# Patient Record
Sex: Male | Born: 1950 | Race: Black or African American | Hispanic: No | Marital: Single | Smoking: Never smoker
Health system: Southern US, Community
[De-identification: ages and names within clinical notes are randomized; demographics above are authoritative.]

## PROBLEM LIST (undated history)

## (undated) DIAGNOSIS — K219 Gastro-esophageal reflux disease without esophagitis: Secondary | ICD-10-CM

## (undated) DIAGNOSIS — E039 Hypothyroidism, unspecified: Secondary | ICD-10-CM

## (undated) DIAGNOSIS — R569 Unspecified convulsions: Secondary | ICD-10-CM

## (undated) DIAGNOSIS — I10 Essential (primary) hypertension: Secondary | ICD-10-CM

## (undated) DIAGNOSIS — F419 Anxiety disorder, unspecified: Secondary | ICD-10-CM

## (undated) DIAGNOSIS — Z992 Dependence on renal dialysis: Secondary | ICD-10-CM

## (undated) DIAGNOSIS — I2510 Atherosclerotic heart disease of native coronary artery without angina pectoris: Secondary | ICD-10-CM

## (undated) DIAGNOSIS — F79 Unspecified intellectual disabilities: Secondary | ICD-10-CM

## (undated) DIAGNOSIS — N289 Disorder of kidney and ureter, unspecified: Secondary | ICD-10-CM

## (undated) HISTORY — PX: AV FISTULA PLACEMENT: SHX1204

## (undated) HISTORY — PX: INSERT / REPLACE / REMOVE PACEMAKER: SUR710

---

## 2010-02-27 ENCOUNTER — Emergency Department: Payer: Self-pay | Admitting: Emergency Medicine

## 2010-04-04 ENCOUNTER — Ambulatory Visit: Payer: Self-pay | Admitting: Vascular Surgery

## 2010-04-25 ENCOUNTER — Ambulatory Visit: Payer: Self-pay | Admitting: Vascular Surgery

## 2010-05-10 ENCOUNTER — Inpatient Hospital Stay: Payer: Self-pay | Admitting: Internal Medicine

## 2010-05-28 ENCOUNTER — Inpatient Hospital Stay: Payer: Self-pay | Admitting: Internal Medicine

## 2010-06-08 ENCOUNTER — Ambulatory Visit: Payer: Self-pay | Admitting: Family Medicine

## 2010-06-27 ENCOUNTER — Ambulatory Visit: Payer: Self-pay | Admitting: Internal Medicine

## 2010-07-16 ENCOUNTER — Ambulatory Visit: Payer: Self-pay | Admitting: Cardiovascular Disease

## 2010-07-16 ENCOUNTER — Inpatient Hospital Stay: Payer: Self-pay | Admitting: Internal Medicine

## 2010-07-27 ENCOUNTER — Ambulatory Visit: Payer: Self-pay | Admitting: Internal Medicine

## 2010-08-01 ENCOUNTER — Ambulatory Visit: Payer: Self-pay | Admitting: Vascular Surgery

## 2010-08-07 ENCOUNTER — Ambulatory Visit: Payer: Self-pay | Admitting: Internal Medicine

## 2010-08-07 ENCOUNTER — Ambulatory Visit: Payer: Self-pay | Admitting: Vascular Surgery

## 2010-08-26 ENCOUNTER — Ambulatory Visit: Payer: Self-pay | Admitting: Vascular Surgery

## 2010-09-02 ENCOUNTER — Ambulatory Visit: Payer: Self-pay | Admitting: Vascular Surgery

## 2010-09-24 ENCOUNTER — Ambulatory Visit: Payer: Self-pay | Admitting: Internal Medicine

## 2010-10-08 ENCOUNTER — Inpatient Hospital Stay: Payer: Self-pay | Admitting: *Deleted

## 2010-11-07 ENCOUNTER — Inpatient Hospital Stay: Payer: Self-pay | Admitting: Vascular Surgery

## 2010-12-03 ENCOUNTER — Inpatient Hospital Stay: Payer: Self-pay | Admitting: Internal Medicine

## 2010-12-25 ENCOUNTER — Ambulatory Visit: Payer: Self-pay | Admitting: Vascular Surgery

## 2011-01-03 ENCOUNTER — Ambulatory Visit: Payer: Self-pay | Admitting: Vascular Surgery

## 2011-01-17 ENCOUNTER — Ambulatory Visit: Payer: Self-pay | Admitting: Vascular Surgery

## 2011-01-24 ENCOUNTER — Ambulatory Visit: Payer: Self-pay | Admitting: Vascular Surgery

## 2011-03-04 ENCOUNTER — Inpatient Hospital Stay: Payer: Self-pay | Admitting: Internal Medicine

## 2011-04-02 ENCOUNTER — Ambulatory Visit: Payer: Self-pay | Admitting: Vascular Surgery

## 2011-06-17 ENCOUNTER — Emergency Department: Payer: Self-pay | Admitting: Emergency Medicine

## 2011-06-17 ENCOUNTER — Other Ambulatory Visit: Payer: Self-pay | Admitting: Family Medicine

## 2011-07-02 ENCOUNTER — Ambulatory Visit: Payer: Self-pay | Admitting: Vascular Surgery

## 2011-07-09 ENCOUNTER — Ambulatory Visit: Payer: Self-pay | Admitting: Vascular Surgery

## 2011-07-24 ENCOUNTER — Ambulatory Visit: Payer: Self-pay | Admitting: Family Medicine

## 2011-08-03 ENCOUNTER — Other Ambulatory Visit: Payer: Self-pay | Admitting: Family Medicine

## 2011-08-04 ENCOUNTER — Observation Stay: Payer: Self-pay | Admitting: Nephrology

## 2011-08-07 ENCOUNTER — Inpatient Hospital Stay: Payer: Self-pay | Admitting: *Deleted

## 2011-09-03 ENCOUNTER — Inpatient Hospital Stay: Payer: Self-pay | Admitting: Vascular Surgery

## 2011-12-03 ENCOUNTER — Ambulatory Visit: Payer: Self-pay | Admitting: Vascular Surgery

## 2012-01-06 ENCOUNTER — Observation Stay: Payer: Self-pay | Admitting: Internal Medicine

## 2012-01-06 LAB — PROTIME-INR
INR: 2.1
Prothrombin Time: 24.2 secs — ABNORMAL HIGH (ref 11.5–14.7)

## 2012-01-06 LAB — COMPREHENSIVE METABOLIC PANEL
Albumin: 3.7 g/dL (ref 3.4–5.0)
Anion Gap: 13 (ref 7–16)
BUN: 102 mg/dL — ABNORMAL HIGH (ref 7–18)
Bilirubin,Total: 0.4 mg/dL (ref 0.2–1.0)
Calcium, Total: 8 mg/dL — ABNORMAL LOW (ref 8.5–10.1)
Co2: 19 mmol/L — ABNORMAL LOW (ref 21–32)
Glucose: 101 mg/dL — ABNORMAL HIGH (ref 65–99)
Osmolality: 297 (ref 275–301)
Potassium: 5.9 mmol/L — ABNORMAL HIGH (ref 3.5–5.1)
SGOT(AST): 20 U/L (ref 15–37)
SGPT (ALT): 15 U/L
Total Protein: 7.7 g/dL (ref 6.4–8.2)

## 2012-01-06 LAB — CBC
HGB: 11.6 g/dL — ABNORMAL LOW (ref 13.0–18.0)
MCH: 31.8 pg (ref 26.0–34.0)
MCHC: 31.3 g/dL — ABNORMAL LOW (ref 32.0–36.0)
Platelet: 124 10*3/uL — ABNORMAL LOW (ref 150–440)

## 2012-01-06 LAB — PHENYTOIN LEVEL, TOTAL: Dilantin: 20.1 ug/mL — ABNORMAL HIGH (ref 10.0–20.0)

## 2012-01-06 LAB — TROPONIN I: Troponin-I: <0.02 ng/mL

## 2012-01-07 LAB — BASIC METABOLIC PANEL
Anion Gap: 17 — ABNORMAL HIGH (ref 7–16)
BUN: 101 mg/dL — ABNORMAL HIGH (ref 7–18)
Chloride: 103 mmol/L (ref 98–107)
Co2: 19 mmol/L — ABNORMAL LOW (ref 21–32)
Creatinine: 12.86 mg/dL — ABNORMAL HIGH (ref 0.60–1.30)
EGFR (African American): 5 — ABNORMAL LOW
Glucose: 85 mg/dL (ref 65–99)
Osmolality: 308 (ref 275–301)
Sodium: 139 mmol/L (ref 136–145)

## 2012-01-07 LAB — PROTIME-INR
INR: 2.4
Prothrombin Time: 26.4 secs — ABNORMAL HIGH (ref 11.5–14.7)

## 2012-01-07 LAB — CBC WITH DIFFERENTIAL/PLATELET
Basophil #: 0 10*3/uL (ref 0.0–0.1)
Basophil %: 0.5 %
Eosinophil %: 3.2 %
HCT: 30.6 % — ABNORMAL LOW (ref 40.0–52.0)
HCT: 31.8 % — ABNORMAL LOW (ref 40.0–52.0)
HGB: 9.8 g/dL — ABNORMAL LOW (ref 13.0–18.0)
HGB: 10.1 g/dL — ABNORMAL LOW (ref 13.0–18.0)
Lymphocyte #: 0.5 10*3/uL — ABNORMAL LOW (ref 1.0–3.6)
Lymphocyte %: 14.3 %
MCH: 32.3 pg (ref 26.0–34.0)
MCHC: 32.1 g/dL (ref 32.0–36.0)
MCV: 101 fL — ABNORMAL HIGH (ref 80–100)
MCV: 101 fL — ABNORMAL HIGH (ref 80–100)
Monocyte #: 0.3 10*3/uL (ref 0.0–0.7)
Monocyte %: 7.7 %
Neutrophil #: 2.8 10*3/uL (ref 1.4–6.5)
Platelet: 117 10*3/uL — ABNORMAL LOW (ref 150–440)
Platelet: 118 10*3/uL — ABNORMAL LOW (ref 150–440)
RBC: 3.14 10*6/uL — ABNORMAL LOW (ref 4.40–5.90)
RDW: 17.8 % — ABNORMAL HIGH (ref 11.5–14.5)
WBC: 3.8 10*3/uL (ref 3.8–10.6)
WBC: 3.6 10*3/uL — ABNORMAL LOW (ref 3.8–10.6)

## 2012-01-07 LAB — RENAL FUNCTION PANEL
Albumin: 3.1 g/dL — ABNORMAL LOW (ref 3.4–5.0)
Anion Gap: 13 (ref 7–16)
Calcium, Total: 7.4 mg/dL — ABNORMAL LOW (ref 8.5–10.1)
Chloride: 102 mmol/L (ref 98–107)
Co2: 21 mmol/L (ref 21–32)
Creatinine: 12.26 mg/dL — ABNORMAL HIGH (ref 0.60–1.30)
EGFR (African American): 5 — ABNORMAL LOW
EGFR (Non-African Amer.): 4 — ABNORMAL LOW
Glucose: 88 mg/dL (ref 65–99)
Phosphorus: 4.7 mg/dL (ref 2.5–4.9)
Potassium: 5.2 mmol/L — ABNORMAL HIGH (ref 3.5–5.1)

## 2012-01-07 LAB — TSH: Thyroid Stimulating Horm: 4.74 u[IU]/mL — ABNORMAL HIGH

## 2012-01-08 LAB — BASIC METABOLIC PANEL
BUN: 47 mg/dL — ABNORMAL HIGH (ref 7–18)
Calcium, Total: 7.9 mg/dL — ABNORMAL LOW (ref 8.5–10.1)
Chloride: 99 mmol/L (ref 98–107)
Co2: 27 mmol/L (ref 21–32)
Creatinine: 7.66 mg/dL — ABNORMAL HIGH (ref 0.60–1.30)
EGFR (Non-African Amer.): 8 — ABNORMAL LOW
Glucose: 78 mg/dL (ref 65–99)
Osmolality: 289 (ref 275–301)
Potassium: 4.2 mmol/L (ref 3.5–5.1)
Sodium: 139 mmol/L (ref 136–145)

## 2012-01-08 LAB — CBC WITH DIFFERENTIAL/PLATELET
Basophil #: 0 10*3/uL (ref 0.0–0.1)
Eosinophil #: 0.2 10*3/uL (ref 0.0–0.7)
Eosinophil %: 5.8 %
HCT: 31.6 % — ABNORMAL LOW (ref 40.0–52.0)
HGB: 10 g/dL — ABNORMAL LOW (ref 13.0–18.0)
Lymphocyte %: 20.9 %
MCH: 32.1 pg (ref 26.0–34.0)
MCHC: 31.8 g/dL — ABNORMAL LOW (ref 32.0–36.0)
MCV: 101 fL — ABNORMAL HIGH (ref 80–100)
Monocyte %: 13.6 %
Neutrophil #: 1.9 10*3/uL (ref 1.4–6.5)
Neutrophil %: 59.1 %
Platelet: 97 10*3/uL — ABNORMAL LOW (ref 150–440)
RDW: 17.6 % — ABNORMAL HIGH (ref 11.5–14.5)

## 2012-01-08 LAB — PHOSPHORUS: Phosphorus: 4.1 mg/dL (ref 2.5–4.9)

## 2012-01-14 ENCOUNTER — Ambulatory Visit: Payer: Self-pay | Admitting: Internal Medicine

## 2012-01-14 LAB — PROTIME-INR
INR: 1.7
Prothrombin Time: 19.9 secs — ABNORMAL HIGH (ref 11.5–14.7)

## 2012-01-14 LAB — FERRITIN: Ferritin (ARMC): 595 ng/mL — ABNORMAL HIGH (ref 8–388)

## 2012-01-14 LAB — CBC CANCER CENTER
Basophil #: 0 x10 3/mm (ref 0.0–0.1)
Basophil %: 0.3 %
Eosinophil #: 0.2 x10 3/mm (ref 0.0–0.7)
Eosinophil %: 3.5 %
Eosinophil: 5 %
HCT: 34.4 % — ABNORMAL LOW (ref 40.0–52.0)
HGB: 10.7 g/dL — ABNORMAL LOW (ref 13.0–18.0)
MCH: 31.9 pg (ref 26.0–34.0)
MCHC: 31.1 g/dL — ABNORMAL LOW (ref 32.0–36.0)
MCV: 102 fL — ABNORMAL HIGH (ref 80–100)
Monocyte #: 0.4 x10 3/mm (ref 0.0–0.7)
Monocytes: 4 %
Neutrophil #: 3.2 x10 3/mm (ref 1.4–6.5)
RDW: 17.2 % — ABNORMAL HIGH (ref 11.5–14.5)

## 2012-01-14 LAB — LACTATE DEHYDROGENASE: LDH: 206 U/L (ref 87–241)

## 2012-01-14 LAB — IRON AND TIBC
Iron Saturation: 93 %
Iron: 99 ug/dL (ref 65–175)

## 2012-01-14 LAB — RETICULOCYTES
Absolute Retic Count: 0.02 10*6/uL — ABNORMAL LOW (ref 0.024–0.084)
Reticulocyte: 0.6 % (ref 0.5–1.5)

## 2012-01-26 ENCOUNTER — Ambulatory Visit: Payer: Self-pay | Admitting: Internal Medicine

## 2012-01-28 ENCOUNTER — Emergency Department: Payer: Self-pay | Admitting: *Deleted

## 2012-01-29 ENCOUNTER — Ambulatory Visit: Payer: Self-pay | Admitting: Internal Medicine

## 2012-02-25 ENCOUNTER — Ambulatory Visit: Payer: Self-pay | Admitting: Internal Medicine

## 2012-04-16 ENCOUNTER — Ambulatory Visit: Payer: Self-pay | Admitting: Vascular Surgery

## 2012-04-28 ENCOUNTER — Ambulatory Visit: Payer: Self-pay | Admitting: Internal Medicine

## 2012-04-28 LAB — CBC CANCER CENTER
Eosinophil %: 2.5 %
Lymphocyte #: 0.7 x10 3/mm — ABNORMAL LOW (ref 1.0–3.6)
MCH: 33.2 pg (ref 26.0–34.0)
MCHC: 31.1 g/dL — ABNORMAL LOW (ref 32.0–36.0)
MCV: 107 fL — ABNORMAL HIGH (ref 80–100)
Monocyte #: 0.6 x10 3/mm (ref 0.2–1.0)
Monocyte %: 11.5 %
Neutrophil #: 3.4 x10 3/mm (ref 1.4–6.5)
Neutrophil %: 71.2 %
Platelet: 119 x10 3/mm — ABNORMAL LOW (ref 150–440)
RBC: 3.53 10*6/uL — ABNORMAL LOW (ref 4.40–5.90)
RDW: 17.4 % — ABNORMAL HIGH (ref 11.5–14.5)
WBC: 4.8 x10 3/mm (ref 3.8–10.6)

## 2012-05-25 ENCOUNTER — Ambulatory Visit: Payer: Self-pay | Admitting: Vascular Surgery

## 2012-05-27 ENCOUNTER — Ambulatory Visit: Payer: Self-pay | Admitting: Internal Medicine

## 2012-10-13 ENCOUNTER — Ambulatory Visit: Payer: Self-pay | Admitting: Vascular Surgery

## 2012-12-10 ENCOUNTER — Ambulatory Visit: Payer: Self-pay | Admitting: Vascular Surgery

## 2013-01-04 ENCOUNTER — Ambulatory Visit: Payer: Self-pay | Admitting: Internal Medicine

## 2013-03-28 ENCOUNTER — Ambulatory Visit: Payer: Self-pay | Admitting: Nephrology

## 2013-04-07 ENCOUNTER — Other Ambulatory Visit: Payer: Self-pay | Admitting: Family Medicine

## 2013-04-07 LAB — CBC WITH DIFFERENTIAL/PLATELET
Basophil #: 0.1 10*3/uL (ref 0.0–0.1)
Basophil %: 2.8 %
HCT: 29.1 % — ABNORMAL LOW (ref 40.0–52.0)
HGB: 9.4 g/dL — ABNORMAL LOW (ref 13.0–18.0)
Lymphocyte #: 0.8 10*3/uL — ABNORMAL LOW (ref 1.0–3.6)
Lymphocyte %: 32.3 %
MCHC: 32.3 g/dL (ref 32.0–36.0)
MCV: 100 fL (ref 80–100)
Monocyte %: 12.2 %
Neutrophil #: 1.3 10*3/uL — ABNORMAL LOW (ref 1.4–6.5)
Neutrophil %: 48.4 %
Platelet: 77 10*3/uL — ABNORMAL LOW (ref 150–440)
RBC: 2.89 10*6/uL — ABNORMAL LOW (ref 4.40–5.90)
WBC: 2.6 10*3/uL — ABNORMAL LOW (ref 3.8–10.6)

## 2013-04-07 LAB — COMPREHENSIVE METABOLIC PANEL
Albumin: 2.9 g/dL — ABNORMAL LOW (ref 3.4–5.0)
Anion Gap: 12 (ref 7–16)
BUN: 63 mg/dL — ABNORMAL HIGH (ref 7–18)
Calcium, Total: 7.7 mg/dL — ABNORMAL LOW (ref 8.5–10.1)
Glucose: 83 mg/dL (ref 65–99)
Osmolality: 295 (ref 275–301)
SGOT(AST): 13 U/L — ABNORMAL LOW (ref 15–37)
Total Protein: 5.3 g/dL — ABNORMAL LOW (ref 6.4–8.2)

## 2013-04-07 LAB — URINALYSIS, COMPLETE
Bilirubin,UR: NEGATIVE
Blood: NEGATIVE
Hyaline Cast: 1
Ketone: NEGATIVE
Protein: 100
WBC UR: 58 /HPF (ref 0–5)

## 2013-04-07 LAB — PHENYTOIN LEVEL, TOTAL: Dilantin: 3.9 ug/mL — ABNORMAL LOW (ref 10.0–20.0)

## 2013-04-07 LAB — TSH: Thyroid Stimulating Horm: 1.86 u[IU]/mL

## 2013-04-12 LAB — URINE CULTURE

## 2013-05-13 ENCOUNTER — Other Ambulatory Visit: Payer: Self-pay | Admitting: Family Medicine

## 2013-05-13 LAB — URINALYSIS, COMPLETE
Bacteria: NONE SEEN
Blood: NEGATIVE
Glucose,UR: NEGATIVE mg/dL (ref 0–75)
Ketone: NEGATIVE
Nitrite: NEGATIVE
Specific Gravity: 1.01 (ref 1.003–1.030)
WBC UR: 150 /HPF (ref 0–5)

## 2013-06-09 ENCOUNTER — Other Ambulatory Visit: Payer: Self-pay | Admitting: Family Medicine

## 2013-06-09 LAB — URINALYSIS, COMPLETE
Bilirubin,UR: NEGATIVE
Glucose,UR: NEGATIVE mg/dL (ref 0–75)
Nitrite: NEGATIVE
Ph: 9 (ref 4.5–8.0)
Protein: 25
RBC,UR: 23 /HPF (ref 0–5)
Specific Gravity: 1.005 (ref 1.003–1.030)
Squamous Epithelial: 1
WBC UR: 65 /HPF (ref 0–5)

## 2013-06-10 LAB — URINE CULTURE

## 2013-06-17 ENCOUNTER — Other Ambulatory Visit: Payer: Self-pay | Admitting: Family Medicine

## 2013-06-17 LAB — URINALYSIS, COMPLETE
Bilirubin,UR: NEGATIVE
Glucose,UR: NEGATIVE mg/dL (ref 0–75)
Ketone: NEGATIVE
Nitrite: NEGATIVE
Protein: 75
RBC,UR: 6 /HPF (ref 0–5)
Squamous Epithelial: NONE SEEN
WBC UR: 93 /HPF (ref 0–5)

## 2013-06-19 LAB — URINE CULTURE

## 2013-07-30 ENCOUNTER — Other Ambulatory Visit: Payer: Self-pay | Admitting: Family Medicine

## 2013-07-30 LAB — URINALYSIS, COMPLETE
Bilirubin,UR: NEGATIVE
Glucose,UR: NEGATIVE mg/dL (ref 0–75)
Ketone: NEGATIVE
Nitrite: POSITIVE
Ph: 8 (ref 4.5–8.0)
Protein: 100
Specific Gravity: 1.01 (ref 1.003–1.030)
Squamous Epithelial: 14

## 2013-08-23 ENCOUNTER — Other Ambulatory Visit: Payer: Self-pay | Admitting: Family Medicine

## 2013-08-24 LAB — URINALYSIS, COMPLETE
Bacteria: NONE SEEN
Bilirubin,UR: NEGATIVE
Glucose,UR: NEGATIVE mg/dL (ref 0–75)
Ketone: NEGATIVE
Nitrite: NEGATIVE
Ph: 9 (ref 4.5–8.0)
Protein: 100
RBC,UR: 5 /HPF (ref 0–5)
Squamous Epithelial: 1
WBC UR: 25 /HPF (ref 0–5)

## 2013-08-26 LAB — URINE CULTURE

## 2013-10-10 ENCOUNTER — Other Ambulatory Visit: Payer: Self-pay | Admitting: Family Medicine

## 2013-10-10 LAB — CBC WITH DIFFERENTIAL/PLATELET
Basophil #: 0 10*3/uL (ref 0.0–0.1)
Basophil %: 0.9 %
Eosinophil #: 0.2 10*3/uL (ref 0.0–0.7)
Eosinophil %: 5.2 %
HCT: 33.5 % — ABNORMAL LOW (ref 40.0–52.0)
Lymphocyte #: 0.6 10*3/uL — ABNORMAL LOW (ref 1.0–3.6)
Lymphocyte %: 17.2 %
MCV: 102 fL — ABNORMAL HIGH (ref 80–100)
Monocyte #: 0.4 x10 3/mm (ref 0.2–1.0)
Neutrophil #: 2.4 10*3/uL (ref 1.4–6.5)
RBC: 3.28 10*6/uL — ABNORMAL LOW (ref 4.40–5.90)
RDW: 16.7 % — ABNORMAL HIGH (ref 11.5–14.5)
WBC: 3.7 10*3/uL — ABNORMAL LOW (ref 3.8–10.6)

## 2013-10-10 LAB — COMPREHENSIVE METABOLIC PANEL
Albumin: 3.2 g/dL — ABNORMAL LOW (ref 3.4–5.0)
Alkaline Phosphatase: 512 U/L — ABNORMAL HIGH
Anion Gap: 9 (ref 7–16)
BUN: 59 mg/dL — ABNORMAL HIGH (ref 7–18)
Calcium, Total: 7.2 mg/dL — ABNORMAL LOW (ref 8.5–10.1)
Chloride: 102 mmol/L (ref 98–107)
Co2: 27 mmol/L (ref 21–32)
Creatinine: 8.29 mg/dL — ABNORMAL HIGH (ref 0.60–1.30)
EGFR (African American): 7 — ABNORMAL LOW
Glucose: 100 mg/dL — ABNORMAL HIGH (ref 65–99)
Potassium: 5 mmol/L (ref 3.5–5.1)
SGOT(AST): 12 U/L — ABNORMAL LOW (ref 15–37)
SGPT (ALT): 11 U/L — ABNORMAL LOW (ref 12–78)
Sodium: 138 mmol/L (ref 136–145)
Total Protein: 5.8 g/dL — ABNORMAL LOW (ref 6.4–8.2)

## 2013-10-10 LAB — TSH: Thyroid Stimulating Horm: 0.983 u[IU]/mL

## 2013-10-10 LAB — HEMOGLOBIN A1C: Hemoglobin A1C: <3.5 % — ABNORMAL LOW (ref 4.2–6.3)

## 2013-10-17 ENCOUNTER — Ambulatory Visit: Payer: Self-pay | Admitting: Urology

## 2013-11-03 ENCOUNTER — Other Ambulatory Visit: Payer: Self-pay | Admitting: Family Medicine

## 2013-11-03 LAB — URINALYSIS, COMPLETE
Bilirubin,UR: NEGATIVE
GLUCOSE, UR: NEGATIVE mg/dL (ref 0–75)
Ketone: NEGATIVE
Nitrite: NEGATIVE
Ph: 8 (ref 4.5–8.0)
RBC,UR: 26 /HPF (ref 0–5)
Specific Gravity: 1.009 (ref 1.003–1.030)

## 2013-11-07 LAB — URINE CULTURE

## 2013-11-28 ENCOUNTER — Other Ambulatory Visit: Payer: Self-pay | Admitting: Family Medicine

## 2013-11-28 LAB — URINALYSIS, COMPLETE
Bacteria: NONE SEEN
Bilirubin,UR: NEGATIVE
Blood: NEGATIVE
Glucose,UR: NEGATIVE mg/dL (ref 0–75)
KETONE: NEGATIVE
Nitrite: NEGATIVE
Ph: 8 (ref 4.5–8.0)
Protein: 100
RBC,UR: 2 /HPF (ref 0–5)
Specific Gravity: 1.011 (ref 1.003–1.030)
Squamous Epithelial: 1

## 2013-11-30 LAB — URINE CULTURE

## 2013-12-27 ENCOUNTER — Other Ambulatory Visit: Payer: Self-pay | Admitting: Nephrology

## 2013-12-27 LAB — POTASSIUM: Potassium: 6.3 mmol/L — ABNORMAL HIGH (ref 3.5–5.1)

## 2014-01-10 ENCOUNTER — Other Ambulatory Visit: Payer: Self-pay | Admitting: Family Medicine

## 2014-01-10 LAB — URINALYSIS, COMPLETE
Bilirubin,UR: NEGATIVE
Blood: NEGATIVE
Ketone: NEGATIVE
Nitrite: NEGATIVE
Ph: 8 (ref 4.5–8.0)
RBC,UR: 59 /HPF (ref 0–5)
SPECIFIC GRAVITY: 1.011 (ref 1.003–1.030)
Squamous Epithelial: 5
WBC UR: 264 /HPF (ref 0–5)

## 2014-01-12 LAB — URINE CULTURE

## 2014-01-25 ENCOUNTER — Other Ambulatory Visit: Payer: Self-pay | Admitting: Family Medicine

## 2014-01-25 LAB — CBC WITH DIFFERENTIAL/PLATELET
BASOS ABS: 0 10*3/uL (ref 0.0–0.1)
Basophil %: 0.9 %
EOS PCT: 4.8 %
Eosinophil #: 0.2 10*3/uL (ref 0.0–0.7)
HCT: 32.7 % — AB (ref 40.0–52.0)
HGB: 10.3 g/dL — AB (ref 13.0–18.0)
Lymphocyte #: 1 10*3/uL (ref 1.0–3.6)
Lymphocyte %: 25.1 %
MCH: 32.7 pg (ref 26.0–34.0)
MCHC: 31.6 g/dL — AB (ref 32.0–36.0)
MCV: 103 fL — AB (ref 80–100)
Monocyte #: 0.4 x10 3/mm (ref 0.2–1.0)
Monocyte %: 9.9 %
NEUTROS ABS: 2.4 10*3/uL (ref 1.4–6.5)
NEUTROS PCT: 59.3 %
Platelet: 95 10*3/uL — ABNORMAL LOW (ref 150–440)
RBC: 3.16 10*6/uL — ABNORMAL LOW (ref 4.40–5.90)
RDW: 16.8 % — AB (ref 11.5–14.5)
WBC: 4.1 10*3/uL (ref 3.8–10.6)

## 2014-01-25 LAB — COMPREHENSIVE METABOLIC PANEL
ALT: 10 U/L — AB (ref 12–78)
ANION GAP: 12 (ref 7–16)
Albumin: 3.2 g/dL — ABNORMAL LOW (ref 3.4–5.0)
Alkaline Phosphatase: 509 U/L — ABNORMAL HIGH
BUN: 85 mg/dL — ABNORMAL HIGH (ref 7–18)
Bilirubin,Total: 0.4 mg/dL (ref 0.2–1.0)
CHLORIDE: 102 mmol/L (ref 98–107)
CREATININE: 11.01 mg/dL — AB (ref 0.60–1.30)
Calcium, Total: 7.1 mg/dL — ABNORMAL LOW (ref 8.5–10.1)
Co2: 24 mmol/L (ref 21–32)
EGFR (African American): 5 — ABNORMAL LOW
EGFR (Non-African Amer.): 4 — ABNORMAL LOW
Glucose: 101 mg/dL — ABNORMAL HIGH (ref 65–99)
Osmolality: 302 (ref 275–301)
POTASSIUM: 6.6 mmol/L — AB (ref 3.5–5.1)
SGOT(AST): 12 U/L — ABNORMAL LOW (ref 15–37)
Sodium: 138 mmol/L (ref 136–145)
Total Protein: 6.2 g/dL — ABNORMAL LOW (ref 6.4–8.2)

## 2014-01-25 LAB — PHENYTOIN LEVEL, TOTAL: DILANTIN: 6.3 ug/mL — AB (ref 10.0–20.0)

## 2014-02-20 ENCOUNTER — Other Ambulatory Visit: Payer: Self-pay | Admitting: Family Medicine

## 2014-02-21 LAB — URINALYSIS, COMPLETE
BILIRUBIN, UR: NEGATIVE
Blood: NEGATIVE
Glucose,UR: 50 mg/dL (ref 0–75)
KETONE: NEGATIVE
NITRITE: NEGATIVE
Ph: 8 (ref 4.5–8.0)
Protein: 100
RBC,UR: <1 /HPF (ref 0–5)
SPECIFIC GRAVITY: 1.01 (ref 1.003–1.030)
Squamous Epithelial: 1
WBC UR: 2 /HPF (ref 0–5)

## 2014-02-22 LAB — URINE CULTURE

## 2014-06-07 ENCOUNTER — Ambulatory Visit: Payer: Self-pay | Admitting: Urology

## 2014-06-07 LAB — BASIC METABOLIC PANEL
Anion Gap: 8 (ref 7–16)
BUN: 96 mg/dL — ABNORMAL HIGH (ref 7–18)
CALCIUM: 8 mg/dL — AB (ref 8.5–10.1)
Chloride: 101 mmol/L (ref 98–107)
Co2: 24 mmol/L (ref 21–32)
Creatinine: 11.58 mg/dL — ABNORMAL HIGH (ref 0.60–1.30)
EGFR (African American): 5 — ABNORMAL LOW
EGFR (Non-African Amer.): 4 — ABNORMAL LOW
GLUCOSE: 87 mg/dL (ref 65–99)
OSMOLALITY: 295 (ref 275–301)
POTASSIUM: 7.9 mmol/L — AB (ref 3.5–5.1)
SODIUM: 133 mmol/L — AB (ref 136–145)

## 2014-06-07 LAB — CBC
HCT: 33.5 % — ABNORMAL LOW (ref 40.0–52.0)
HGB: 10.5 g/dL — ABNORMAL LOW (ref 13.0–18.0)
MCH: 32.5 pg (ref 26.0–34.0)
MCHC: 31.4 g/dL — AB (ref 32.0–36.0)
MCV: 103 fL — AB (ref 80–100)
Platelet: 136 10*3/uL — ABNORMAL LOW (ref 150–440)
RBC: 3.24 10*6/uL — AB (ref 4.40–5.90)
RDW: 16.4 % — AB (ref 11.5–14.5)
WBC: 4.1 10*3/uL (ref 3.8–10.6)

## 2014-06-09 ENCOUNTER — Ambulatory Visit: Payer: Self-pay | Admitting: Urology

## 2014-06-14 LAB — PATHOLOGY REPORT

## 2014-12-25 ENCOUNTER — Inpatient Hospital Stay: Payer: Self-pay | Admitting: Internal Medicine

## 2015-02-13 NOTE — Op Note (Signed)
PATIENT NAME:  [name] Wiley, [name] Wiley MR#:  [id number] DATE OF BIRTH:  May 18, 1951  DATE OF PROCEDURE:  10/13/2012  PREOPERATIVE DIAGNOSIS: End-stage renal disease with nonfunctional right jugular PermCath.   POSTOPERATIVE DIAGNOSIS:  End-stage renal disease with nonfunctional right jugular PermCath.   PROCEDURE: Removal of right jugular PermCath.   SURGEON: Annice NeedyJason S. Steed Kanaan, M.D.   ANESTHESIA: Local.   ESTIMATED BLOOD LOSS: Minimal.   INDICATION FOR PROCEDURE: This is a gentleman with end-stage renal disease who no longer needs his PermCath as he has a more permanent type for access. It has been present for almost a year. We are asked to remove the PermCath.   DESCRIPTION OF PROCEDURE: The patient was brought to the vascular interventional radiology suite. The right neck and chest and the existing PermCath were sterilely prepped and draped and a sterile surgical field was created. The area was locally anesthetized with 1% lidocaine and we dissected out the catheter cuff, which was near the skin, with blunt dissection, with curved hemostats and transected the fibrous sheath with an 11 blade. The catheter was then removed in its entirety without difficulty with gentle traction, pressure was held and a sterile dressing was placed. The patient tolerated the procedure well and was taken to the recovery room in stable condition. ____________________________ Annice NeedyJason S. Lakyla Biswas, MD jsd:sb D: 10/13/2012 10:24:51 ET     T: 10/13/2012 12:58:57 ET        JOB#: [id number] cc: Annice NeedyJason S. Cate Oravec, MD, <Dictator> Annice NeedyJASON S Babette Stum MD ELECTRONICALLY SIGNED 10/15/2012 18:35

## 2015-02-13 NOTE — Op Note (Signed)
PATIENT NAME:  [name] Wiley, [name] Wiley MR#:  [id number] DATE OF BIRTH:  1951-07-29  DATE OF PROCEDURE:  05/25/2012  PREOPERATIVE DIAGNOSES:  1. Complication of right IJ cuffed tunneled dialysis catheter with poor flow.  2. End-stage renal disease requiring hemodialysis.   POSTOPERATIVE DIAGNOSES: 1. Complication of right IJ cuffed tunneled dialysis catheter with poor flow.  2. End-stage renal disease requiring hemodialysis.   PROCEDURE PERFORMED: TPA infusion for catheter clearance, thrombolysis.   PROCEDURE PERFORMED BY: Renford DillsGregory G. Adriene Knipfer, MD    PROCEDURE: The patient is brought to Special Procedures holding area. He is placed on a gurney supine. Aspiration of his catheter demonstrates significant resistance with the arterial lumen. Forward flushing is adequate. Therefore, 2 mg of TPA are reconstituted in 50 mL, two aliquots are prepared and simultaneous infusion of each aliquot is performed through the tunneled catheter over a 2-1/2 to 3 hour period at the conclusion of which both lumens aspirate quite easily and are flushed well with saline. They are then packed with 5000 units of heparin per lumen by myself. Sterile caps are applied. The patient tolerated the procedure well and there were no immediate complications.   ____________________________ Renford DillsGregory G. Jshaun Abernathy, MD ggs:drc D: 05/25/2012 16:44:22 ET T: 05/25/2012 17:16:59 ET JOB#: [id number]  cc: Renford DillsGregory G. Yashar Inclan, MD, <Dictator> Renford DillsGREGORY G Boyde Grieco MD ELECTRONICALLY SIGNED 06/06/2012 13:20

## 2015-02-16 LAB — BASIC METABOLIC PANEL
Anion Gap: 12 (ref 7–16)
BUN: 79 mg/dL — AB
CREATININE: 9.46 mg/dL — AB
Calcium, Total: 8.2 mg/dL — ABNORMAL LOW
Chloride: 97 mmol/L — ABNORMAL LOW
Co2: 23 mmol/L
EGFR (Non-African Amer.): 5 — ABNORMAL LOW
GFR CALC AF AMER: 6 — AB
GLUCOSE: 82 mg/dL
Potassium: 5.4 mmol/L — ABNORMAL HIGH
SODIUM: 132 mmol/L — AB

## 2015-02-16 LAB — CBC WITH DIFFERENTIAL/PLATELET
BASOS ABS: 0 10*3/uL (ref 0.0–0.1)
BASOS PCT: 1.3 %
EOS ABS: 0.1 10*3/uL (ref 0.0–0.7)
Eosinophil %: 4 %
HCT: 30.5 % — ABNORMAL LOW (ref 40.0–52.0)
HGB: 9.9 g/dL — AB (ref 13.0–18.0)
Lymphocyte #: 1 10*3/uL (ref 1.0–3.6)
Lymphocyte %: 30.1 %
MCH: 32.1 pg (ref 26.0–34.0)
MCHC: 32.4 g/dL (ref 32.0–36.0)
MCV: 99 fL (ref 80–100)
MONO ABS: 0.4 x10 3/mm (ref 0.2–1.0)
MONOS PCT: 11.3 %
Neutrophil #: 1.7 10*3/uL (ref 1.4–6.5)
Neutrophil %: 53.3 %
Platelet: 143 10*3/uL — ABNORMAL LOW (ref 150–440)
RBC: 3.08 10*6/uL — AB (ref 4.40–5.90)
RDW: 17.3 % — ABNORMAL HIGH (ref 11.5–14.5)
WBC: 3.2 10*3/uL — AB (ref 3.8–10.6)

## 2015-02-16 LAB — TROPONIN I: Troponin-I: <0.03 ng/mL

## 2015-02-16 LAB — PHENYTOIN LEVEL, TOTAL: DILANTIN: 4.9 ug/mL — AB

## 2015-02-16 NOTE — Op Note (Signed)
PATIENT NAME:  [name], [name] MR#:  [id number] DATE OF BIRTH:  1950-12-24  DATE OF PROCEDURE:  12/10/2012  PREOPERATIVE DIAGNOSES:  1. End-stage renal disease.  2. Clotted right arm arteriovenous graft.  3. Stroke.  4. Hypertension.   POSTOPERATIVE DIAGNOSES: 1. End-stage renal disease.  2. Clotted right arm arteriovenous graft.  3. Stroke.  4. Hypertension.   PROCEDURES:  1. Ultrasound guidance for vascular access to the graft with an antegrade and retrograde fashion crossing.  2. Catheter directed thrombolysis with 4 mg of tPA delivered with the AngioJet AVX catheter.  3. Fogarty embolectomy for thrombosis and arterial plug.  4. Percutaneous transluminal angioplasty of venous anastomosis with 7 mm diameter angioplasty balloon.   SURGEON: Annice NeedyJason S. Kassaundra Hair, MD  ANESTHESIA: Local with moderate conscious sedation.   BLOOD LOSS: 25 mL.   FLUOROSCOPY TIME: Approximately 3 minutes, and 35 mL of contrast were used.   INDICATION FOR PROCEDURE: A 64 year old African-American male known to our service. He has a thrombosed right upper arm AV graft. We are attempting to salvage this.   DESCRIPTION OF THE PROCEDURE: The patient was brought to the vascular interventional radiology suite. Right upper extremity was sterilely prepped and draped, and a sterile surgical field was created. Due to the pulseless nature of the graft, ultrasound was used to access this in both an antegrade and retrograde fashion crossing. This was done without difficulty with micropuncture needles, and permanent image was recorded. Micropuncture wires and sheath were placed, and we upsized to 6 JamaicaFrench sheath. The patient was given a small dose of intravenous heparin. A Magic Torque wire was placed into the brachial artery and into the axillary vein, and 4 mg of tPA were delivered, encompassing the entirety of the graft from the brachial artery to the axillary vein. I then performed an image with the Kumpe catheter in the brachial  artery, which showed an arterial plug. I treated this with a 5 Fogarty embolectomy balloon, with resultant resolution of this and improvement of flow within the graft, and the graft was now patent. There was a moderate stenosis in the 60% to 70% range of the venous anastomosis, which may have been the cause of the thrombosis. I treated this with a 7 mm diameter angioplasty balloon, with an excellent angiographic completion result. At this point, the graft was patent and had a nice thrill, and I elected to terminate the procedure. Both sheaths were removed around a 4-0 Monocryl pursestring sutures, pressure was held. Sterile dressing was placed. The patient tolerated the procedure well and was taken to the recovery room in stable condition.    ____________________________ Annice NeedyJason S. Merritt Kibby, MD jsd:OSi D: 12/10/2012 14:58:09 ET T: 12/11/2012 09:27:35 ET JOB#: [id number]  cc: Annice NeedyJason S. Detrich Rakestraw, MD, <Dictator> Annice NeedyJASON S Tulip Meharg MD ELECTRONICALLY SIGNED 12/13/2012 16:58

## 2015-02-17 ENCOUNTER — Inpatient Hospital Stay
Admit: 2015-02-17 | Disposition: A | Discharge status: Other Acute Inpatient Hospital | Payer: Self-pay | Attending: Internal Medicine | Admitting: Internal Medicine

## 2015-02-17 LAB — BASIC METABOLIC PANEL
Anion Gap: 16 (ref 7–16)
BUN: 90 mg/dL — ABNORMAL HIGH
CO2: 19 mmol/L — AB
CREATININE: 10.06 mg/dL — AB
Calcium, Total: 8.1 mg/dL — ABNORMAL LOW
Chloride: 98 mmol/L — ABNORMAL LOW
GFR CALC AF AMER: 6 — AB
GFR CALC NON AF AMER: 5 — AB
Glucose: 69 mg/dL
Potassium: 6.3 mmol/L — ABNORMAL HIGH
Sodium: 133 mmol/L — ABNORMAL LOW

## 2015-02-17 LAB — CBC WITH DIFFERENTIAL/PLATELET
BASOS ABS: 0 10*3/uL (ref 0.0–0.1)
Basophil %: 1 %
Eosinophil #: 0.2 10*3/uL (ref 0.0–0.7)
Eosinophil %: 5.3 %
HCT: 29.5 % — AB (ref 40.0–52.0)
HGB: 9.6 g/dL — ABNORMAL LOW (ref 13.0–18.0)
Lymphocyte #: 0.9 10*3/uL — ABNORMAL LOW (ref 1.0–3.6)
Lymphocyte %: 23.5 %
MCH: 31.6 pg (ref 26.0–34.0)
MCHC: 32.7 g/dL (ref 32.0–36.0)
MCV: 97 fL (ref 80–100)
MONOS PCT: 12.5 %
Monocyte #: 0.5 x10 3/mm (ref 0.2–1.0)
Neutrophil #: 2.3 10*3/uL (ref 1.4–6.5)
Neutrophil %: 57.7 %
PLATELETS: 119 10*3/uL — AB (ref 150–440)
RBC: 3.05 10*6/uL — ABNORMAL LOW (ref 4.40–5.90)
RDW: 17 % — AB (ref 11.5–14.5)
WBC: 3.9 10*3/uL (ref 3.8–10.6)

## 2015-02-17 LAB — TROPONIN I

## 2015-02-17 LAB — PHOSPHORUS: PHOSPHORUS: 5.5 mg/dL — AB

## 2015-02-17 NOTE — Op Note (Signed)
PATIENT NAME:  [name] JewettSPEIGHT, Zarian MR#:  [id number] DATE OF BIRTH:  [birth year]  DATE OF PROCEDURE:  06/09/2014  PREOPERATIVE DIAGNOSIS: Recurrent bladder tumors.   POSTOPERATIVE DIAGNOSIS: Recurrent bladder tumors.  PROCEDURE: Cystoscopy, biopsy of bladder tumor, fulguration of multiple bladder tumors.  SURGEON: Trey Paulaichard Camarion Weier, DO  DESCRIPTION OF PROCEDURE: With the patient sterilely prepped and draped, in supine lithotomy position, after an appropriate timeout, I have a 22-French sheath with a right angle 30 degree lens. I look in the urethra. There are no strictures, masses or growths. Prostate is small But there is a slightly enlarged middle lobe. Into the bladder I see a tumor at the base of the trigone. I biopsy it because it is the only large tumor. The rest are small, tiny little tumors which I fulgurate multiply. They do not appear to be chronic cystitis. He has 1 small saccule there. I fulgurate inside the saccule, but there are no tumors. I do not think there are any tumors in it, but I fulgurate it just in case. He tolerates it well. At the end of the procedure, there is no bleeding, bladder is emptied, and 30 mL of 0.5% plain Marcaine is placed in the bladder, a B and O suppository in the rectum. Rectal exam is negative for tumor, mass, growth or nodularity of the prostate or rectum. Sent to recovery in satisfactory condition.    ____________________________ Caralyn Guileichard D. Edwyna ShellHart, DO rdh:sb D: 06/09/2014 14:03:10 ET T: 06/09/2014 14:56:40 ET JOB#: [id number]  cc: Caralyn Guileichard D. Edwyna ShellHart, DO, <Dictator> Afia Messenger D Florencio Hollibaugh DO ELECTRONICALLY SIGNED 06/09/2014 15:27

## 2015-02-18 LAB — POTASSIUM: Potassium: 4.2 mmol/L

## 2015-02-18 NOTE — Consult Note (Signed)
PATIENT NAME:  [name] Wiley, [name] Wiley MR#:  [id number] DATE OF BIRTH:  [birth year]  DATE OF CONSULTATION:  01/07/2012  REFERRING PHYSICIAN:  Larena Glassman, MD  CONSULTING PHYSICIAN:  Doralee Albino. Maryruth Bun, MD  REASON FOR CONSULTATION: Patient refusing dialysis.   HISTORY OF PRESENT ILLNESS: [name] Wiley is a 64 year old single African American male with history of bipolar disorder and end-stage renal disease on hemodialysis who was admitted to the Medicine service for urgent dialysis and altered mental status. Creatinine was 12.01 and BUN was 102 at the time of admission. The patient had been refusing dialysis all of last week. He has recently undergone some psychotropic medication changes at [facility] where he has been residing. The patient has been followed by an outpatient psychiatrist, Dr. Mayford Knife, who is at [facility]. He has failed a number of trials of other atypical antipsychotics in the past including Risperdal, Seroquel, Geodon, Zyprexa, and, Depakote. The patient had done well for years on a combination of Haldol in the morning prior to dialysis. He is currently on Seroquel XR 200 mg p.o. b.i.d. and was recently started on Prolixin 10 mg p.o. b.i.d. to help with mood stabilization. When the patient was initially admitted to the hospital, he was endorsing some hyperreligious thoughts stating that God would help cure his kidneys. He himself is a poor historian and speech is extremely difficult to understand. The patient has had a speech impairment over the past 15 years. He did appear to be calm during the interview and had just received dialysis this morning. He was oriented to place and situation. He did look at the board in his room to come up with the month and the year but knew he was at [facility]. The patient denied that he had refused dialysis prior to admission and said that he had been compliant with dialysis. He denied any problems with having dialysis from here on out. He  did not appear to be actively psychotic and was not endorsing any auditory or visual hallucinations but thought processes were tangential. The patient was rambling at times and again speech was difficult to understand. Since being in the hospital he has not been agitated or violent and has been cooperative with nursing staff. He denies feeling depressed and denies any suicidal thoughts. Collateral information from his brother, who is also his healthcare power-of-attorney, indicated that the patient has had periods of times in the past where he would refuse dialysis for 1 to 2 weeks.   PAST PSYCHIATRIC HISTORY: The patient has been hospitalized at [facility] in the past in [place] for a six month period as a teenager. There is no history of any prior suicide attempts. He is currently followed by Dr. Francena Hanly at [facility].   Past psychotropic medication trials include Risperdal, Seroquel, Geodon, Zyprexa, Depakote, and Trileptal. He is currently on Trileptal 600 mg p.o. b.i.d.   FAMILY PSYCHIATRIC HISTORY: There is no history of any mental illness or substance use in the family.   CURRENT OUTPATIENT MEDICATIONS:  1. Ascorbic acid 500 mg p.o. b.i.d.  2. Cogentin 1 mg p.o. b.i.d.  3. Calcium acetate 667 mg 2 tabs t.i.d.  4. Sensipar 60 mg 2 tabs daily. 5. Ferrous sulfate 324 mg p.o. daily.  PRIOR TO ADMISSION HE WAS ON: 1. Fluphenazine 10 mg p.o. b.i.d.  2. Folic acid 1 mg p.o. daily. 3. Levothyroxine 237 mcg p.o. daily.  4. Ativan 2 mg p.o. daily. 5. Metolazone 2.5 mg p.o. daily.  6. Multivitamin  1 tablet p.o. daily. 7. Megace 3000 mg 1 capsule b.i.d.  8. Omeprazole 40 mg p.o. b.i.d.  9. Trileptal 600 mg p.o. b.i.d. 10. Phenergan 12.5 mg suppository p.r.n.  11. Phenytoin 200 mg p.o. daily, 300 mg at bedtime, 50 mg in the morning. 12. Seroquel XR 200 mg p.o. b.i.d.  13. Sevelamer 800 mg p.o. t.i.d.  14. Effexor 37.5 mg p.o. daily.  15. Vitamin D3 three capsules  daily. 16. Coumadin 2 mg p.o. daily.   PAST MEDICAL HISTORY:  1. End-stage renal disease on dialysis Tuesday, Thursday, and Saturday.  2. Hypertension.  3. History of seizure disorder.  4. Anemia of chronic disease.  5. Dyslipidemia.  6. Hypothyroidism.  7. History of nonspecific arrhythmia.   ALLERGIES: No known drug allergies.   SUBSTANCE ABUSE HISTORY: There is no history of any heavy alcohol use or illicit drug use.   SOCIAL HISTORY: The patient is currently a resident at [facility] skilled nursing facility. His brother is his healthcare power-of-attorney. He has never been married and has no children. He is currently on disability.   LEGAL HISTORY: No history of any arrests or incarcerations.   MENTAL STATUS EXAM: [name] Wiley is a 64 year old African American male who is laying in his hospital bed watching baseball on TV. He was fully alert and oriented to place and situation. He gave the date as being 01/09/2012 after looking at the board, which was incorrect as it was March 13th. Speech was garbled and difficult to understand at times. Eye contact was fair. He described his mood as being "okay" and denied any suicidal thoughts or homicidal thoughts. Affect was fairly calm. Thought processes were tangential but no hyperreligiosity noted in the interview. He denied any auditory or visual hallucinations and did not appear to be responding to internal stimuli. Judgment and insight appeared to be limited. Attention and concentration were poor. Due to tangential thought processes, the patient was not able to answer questions with regards to memory and recall. He knew that he lived at [facility]. He was unable to understand any proverbs.   SUICIDE RISK ASSESSMENT: At this time [name] Wiley is a fairly low risk of harm to self and others intentionally but secondary to altered mental status it may develop as a refusal to dialysis. Risk may be elevated in the future.   REVIEW OF SYSTEMS:  Unable to understand secondary to garbled speech. He did deny being in any pain.   PHYSICAL EXAMINATION:   VITAL SIGNS: Blood pressure 143/80, heart rate 79, respirations 18, temperature 98.1, pulse oximetry 93% on room air. Please see initial physical exam as completed by admitting physician, Dr. Larena GlassmanAmir Firozvi.  LABORATORY, DIAGNOSTIC, AND RADIOLOGICAL DATA: Sodium 136, potassium 5.2, chloride 102, CO2 21, BUN 98, creatinine 12.26, glucose 88, serum albumin 3.1, free thyroxine 0.28, TSH 4.74, WBC 3.6, hemoglobin 10.1, hematocrit 31.8, platelet count 118. INR 2.4. PT 26.4.   DIAGNOSES:  AXIS I: Bipolar disorder per history.   AXIS II: Mild mental retardation.   AXIS III:  1. End-stage renal disease, on dialysis. 2. Hypertension.  3. Seizure disorder.  4. Anemia of chronic disease.  5. Dyslipidemia.  6. Hypothyroidism.  7. History of nonspecific arrhythmia.   AXIS IV: Severe. Inability to care for self independently, chronic multiple medical problems, lack of primary support.   AXIS V: GAF at present equals 40.   ASSESSMENT AND TREATMENT RECOMMENDATIONS: [name] Wiley is a 64 year old African American male with history of a mood disorder as well  as mild mental retardation who has recently been refusing dialysis last week. He was compliant with dialysis this morning and mental status has improved in comparison to admission. He is not endorsing any hyperreligious thoughts but thought processes are somewhat tangential. Will go ahead and increase Seroquel XR to 300 mg p.o. b.i.d. and 50 mg p.o. q.8 hours p.r.n. Will also restart Haldol 2 mg p.o. scheduled in the morning Tuesday, Thursday, and Saturday prior to dialysis. The patient is also on Trileptal 600 mg p.o. b.i.d. as he has failed trials of Depakote and Tegretol in the past. If mood does not stabilize with increase in Seroquel, would consider a retrial of Depakote or possibly Geodon to help with mood stabilization. The patient is on  Effexor-XR at 37.5 mg p.o. daily. It is unclear why he is on Effexor as it does have renal clearance. Would consider using another trial of an antidepressant. Will discontinue Effexor and start Celexa 20 mg p.o. daily. Will have to monitor EKG to rule out QTc prolongation.   Medication changes were discussed with the patient's brother and healthcare power-of-attorney who was in agreement. Will continue to follow the patient while he is on the Medicine service for the next 2 to 3 days to ensure that he remains calm and compliant with care. Please feel free to page me with any questions.   Thank you for the consult.   ____________________________ Doralee Albino. Maryruth Bun, MD akk:drc D: 01/07/2012 19:04:54 ET T: 01/08/2012 05:52:14 ET JOB#: [id number]  cc: Festus Pursel K. Maryruth Bun, MD, <Dictator> Darliss Ridgel MD ELECTRONICALLY SIGNED 01/08/2012 10:49

## 2015-02-18 NOTE — H&P (Signed)
PATIENT NAME:  [name] Wiley, [name] Wiley MR#:  [id number] DATE OF BIRTH:  [birth year]  DATE OF ADMISSION:  01/06/2012  REFERRING PHYSICIAN: Janalyn Harderavid Kaminski, MD   PRIMARY CARE PHYSICIAN: Dorothey Basemanavid Bronstein, MD   NEPHROLOGIST: Mosetta PigeonHarmeet Singh, MD and Mady HaagensenMunsoor Lateef, MD   REASON FOR ADMISSION: Altered mental status, bipolar decompensation from adjustment of medications resulting in missed dialysis and hyperkalemia.   HISTORY OF PRESENT ILLNESS: The patient was unable to give history due to his altered mental status. As per his brother, who is Power of Gerrit Friendsttorney and by his bedside, he is a 64 year old African American male with history of mental retardation, bipolar, hemodialysis on Tuesday, Thursday, Saturday, who last had dialysis one week ago. He has been refusing as he has just had adjustment of his psychiatric medications by his psychiatrist, Dr. Mayford KnifeWilliams. In reviewing his medications, it looks like he was recently just started on fluphenazine. Since then, he has been acting up and refusing. He has been unable to give history. He presents with hyperkalemia, potassium 5.9, volume overload; and we are asked to admit the patient for dialysis and altered mental status.   PAST MEDICAL HISTORY:  1. Chronic kidney disease, stage 5, hemodialysis Tuesday, Thursday, Saturday. 2. Hyperkalemia in the past. 3. Mental retardation.  4. Bipolar disorder. 5. Seizure disorder.  6. Hypothyroidism.  7. Anemia of chronic disease.  8. Hypertension.  9. Secondary hyperparathyroidism.   HOME MEDICATIONS:  1. Ascorbic acid 500 mg b.i.d.   2. Benztropine 1 mg b.i.d.  3. Calcium acetate 667 mg, 2 capsules t.i.d.  4. Sensipar 60 mg, 2 tablets daily.  5. Ferrous sulfate 324 mg daily. 6. Fluphenazine 10 mg b.i.d.  7. Folic acid 1 mg daily.  8. Levothyroxine 237 mcg daily.  9. Lorazepam 2 mg daily.  10. Metolazone 2.5 mg daily.  11. Multivitamin 1 tablet daily.  12. Megace 3000 mg, 1 capsule b.i.d.  13. Omeprazole 20 mg, 2  tablets b.i.d.  14. Oxcarbazepine 600 mg b.i.d. 15. Phenergan 12.5 mg suppository p.r.n,12.5 mg tablet p.o. every 6 hours p.r.n.  16. Phenytoin 400 mg daily, 300 mg at bedtime, 50 mg in the morning. 17. Seroquel 200 mg b.i.d.  18. Sevelamer 800 mg  t.i.d.  19. Effexor 37.5 mg, 1 capsule daily.  20. Vitamin D3, 3 capsules daily.  21. Warfarin 2 mg daily.   DRUG ALLERGIES: No known drug allergies.   SOCIAL HISTORY: He lives in a group home. He is a nonsmoker, nonalcoholic. No children. He is not married.   FAMILY HISTORY: Mother died at age 66 of congestive heart failure. Father died at age 66 of congestive heart failure.   REVIEW OF SYSTEMS: Review of systems is unobtainable secondary to the patient's altered mental status. He has been speaking gibberish.   PHYSICAL EXAMINATION:  VITAL SIGNS: Temperature 97, heart rate 69, respiratory rate 20, blood pressure 181/83. Blood pressure now is 170/95.   GENERAL: The patient is well developed, well nourished, in no apparent distress, alert and oriented x3.   HEENT: Pupils are equal, reactive to light and accommodation. Extraocular movements are intact. Anicteric sclerae. No difficulty hearing. Oropharynx is clear.   NECK: No JVD. No thyromegaly, no lymphadenopathy. No carotid bruits.   LUNGS: Clear to auscultation and percussion. No use of accessory muscles or increased effort. Resonant to percussion.  No adventitious breath sounds.  CARDIOVASCULAR: Regular rate and rhythm. No murmurs, gallops, or rubs appreciated. No lower extremity edema, 2+ dorsalis pedis pulses. PMI is not lateralized.   BREASTS:  No obvious mass.   ABDOMEN: Soft, nontender, nondistended. Positive bowel sounds. He does have a dialysis catheter in his right subclavian which is clean, dry, and intact.   GU: Deferred.   MUSCULOSKELETAL: Strength 5/5. No clubbing, cyanosis, or edema.   SKIN: No rashes, lesions, or induration. Warm to touch.   LYMPH: No lymphadenopathy  in the cervical area.   NEUROLOGICAL: Cranial nerves II through XII are intact. He follows some commands. There is no dysarthria, aphasia, dysphasia or contractures. Strength five out of five in all four extremities.   PSYCHIATRIC: Alert but poor judgment, confused and gets agitated at times.   LABORATORY, DIAGNOSTIC AND RADIOLOGICAL DATA:  Glucose 101, BUN 102, creatinine 12.01, sodium 132, potassium 5.9, chloride 100, bicarbonate 19, anion gap 13, total protein 7.7, albumin 2.7, total bilirubin 0.4, alkaline phosphatase 43, AST 20, ALT 15.  Troponin less than 0.02.  WBC 3.6, hemoglobin 11.6, hematocrit 37, platelets 124, MCV 102.   ASSESSMENT AND PLAN: The patient is an unfortunate 64 year old African American male with a history of bipolar disorder, chronic kidney disease, stage 5, seizure disorder, who presents with missed hemodialysis after having bipolar decompensation after starting new medications.  1. Missed hemodialysis resulting in hyperkalemia due to bipolar decompensation: We will give Kayexalate and get hemodialysis via Nephrology.  2. End-stage renal disease: I have consulted Dr. Thedore Mins to see the patient and to coordinate dialysis.  3. Bipolar disorder, mental retardation: We will continue his home medications except fluphenazine which,  as per family, started the whole decompensation. We will also consult Psychiatry.  4. Low sodium: Due to end-stage renal disease.  5. Hyperglycemia: This is stress reaction.  6. Seizure disorder: We will continue Dilantin, Trileptal. We will lower his Dilantin dose until we get just 400 mg daily in the evening and check Dilantin level.  7. Hypothyroidism: We will continue Synthroid and check a TSH.  8. Pancytopenia: Most likely this is due to chronic kidney disease, stage 5. We will recheck CBC tomorrow.  9. Deep venous thrombosis prophylaxis: The patient is on Coumadin.  We will check PT-INR, which was not  done in the Emergency Room.   CODE  STATUS:  FULL CODE.     TOTAL TIME SPENT ON ADMISSION: 55 minutes.  ____________________________ Corie Chiquito Lafayette Dragon, MD aaf:cbb D: 01/06/2012 17:51:43 ET T: 01/06/2012 21:30:59 ET JOB#: [id number]  cc: Karolee Ohs A. Lafayette Dragon, MD, <Dictator> Teena Irani. Terance Hart, MD Mosetta Pigeon, MD Karolee Ohs Laverda Page MD ELECTRONICALLY SIGNED 01/07/2012 10:30

## 2015-02-18 NOTE — Discharge Summary (Signed)
PATIENT NAME:  [name] Wiley, [name] Wiley MR#:  [id number] DATE OF BIRTH:  11/02/1950  DATE OF ADMISSION:  01/06/2012 DATE OF DISCHARGE:  01/08/2012  PRIMARY CARE PHYSICIAN: Dr. Terance Hart  PRIMARY NEPHROLOGIST: Dr. Cleone Slim and Dr. Mady Haagensen, [facility]   REASON FOR ADMISSION: Altered mental status.   DISCHARGE DIAGNOSES:  1. Altered mental status, combination of newly started bipolar medication (fluphenazine) causing patient to miss several outpatient hemodialysis sessions with resultant uremic encephalopathy. 2. Uremic encephalopathy due to missing outpatient hemodialysis.   3. Hyperkalemia due to missing outpatient hemodialysis.  4. End-stage renal disease on hemodialysis every Tuesday, Thursday, Saturday.  5. History of seizure disorder with supratherapeutic Dilantin level on admission, dose has been reduced.  6. Anemia of chronic disease.  7. History is dialysis access thrombosis. Patient on Coumadin.  8. History of bipolar disorder. Medications adjusted by psychiatry.  9. Pancytopenic. 10. Hypertension. 11. Hypothyroidism.  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   CONSULTATIONS:  1. Nephrology, Dr. Cleone Slim. 2. Psychiatry, Dr. Caryn Section.   DISPOSITION: [facility] skilled nursing facility.   MEDICATION CHANGES/NEW MEDICATIONS:  1. Seroquel XR 300 mg p.o. b.i.d. (9:00 a.m. and 5:00 p.m.) scheduled and 50 mg p.o. q.8 hours p.r.n. psychosis or agitation.   2. Haldol 2 mg p.o. every Tuesday, Thursday, Saturday prior to dialysis.  3. Celexa 20 mg p.o. daily.  4. Dilantin 300 mg p.o. at bedtime (medication dose has been decreased from his baseline).  PRIOR MEDICATIONS:  1. Folic acid 1 mg daily.  2. Ferrous fumarate 324 mg daily.  3. Metolazone 2.5 mg daily.  4. Multivitamin 1 tablet daily.  5. Sugar-free Prostat 64 30 mL p.o. daily.  6. Vitamin D3 1000 international units 3 tablets p.o. daily. 7. Benztropine 1 mg p.o. b.i.d. 8. Omega-3 fatty acid 1000 mg p.o.  b.i.d.  9. Oxcarbazepine 600 mg p.o. b.i.d.  10. Vitamin C 500 mg p.o. b.i.d.  11. Renvela 800 mg 1 tab p.o. t.i.d. with meals.  12. Omeprazole 40 mg daily.  13. Calcium acetate 667 mg 2 tabs p.o. t.i.d. with snacks/meals.  14. Warfarin 2 mg daily at 5:00 p.m.   15. Cinacalcet 60 mg 2 tablets p.o. daily.  16. Levothyroxine 237 mcg daily.  17. Phenergan 12.5 mg p.o. every six hours p.r.n.   DISCHARGE ACTIVITY: As tolerated.   DISCHARGE DIET: Renal, ADA, low sodium, low fat, low cholesterol.   DISCHARGE INSTRUCTIONS:  1. Take medications as prescribed.  2. Return to Emergency Department for recurrence of symptoms.    FOLLOW UP INSTRUCTIONS:  1. Follow up with house M.D. at skilled nursing facility within one week. Patient needs repeat BMP and CBC as well as repeat PT/INR check within one week and repeat TSH in two weeks. 2. Follow up with Dr. Thedore Mins within 1 to 2 weeks.  3. Follow up with psychiatrist, Dr. Francena Hanly, at [facility] within one week.   REFERRAL: Patient being referred to first available physician at [facility] for pancytopenia.   ADDITIONAL DISCHARGE INSTRUCTIONS: Do not take fluphenazine.   PROCEDURES: None other than inpatient hemodialysis.   LABORATORY, DIAGNOSTIC AND RADIOLOGICAL DATA: BUN 102, creatinine 12 on admission. BUN 47, creatinine 7.76 on day of discharge. TSH 4.74, free T4 0.28. Serum Dilantin 20.1. CBC on admission: WBC 3.6, hemoglobin 11.6, hematocrit 37, platelets 124. CBC on day of discharge: WBC 3.2, hemoglobin 10, hematocrit 31.6, platelets 97 with 59% neutrophil predominance. Serum potassium 5.9 on day of admission, 4.2 on the day of discharge.  BRIEF HISTORY/HOSPITAL COURSE: Patient is a 64 year old male with past medical history of mental retardation, bipolar disorder, end-stage renal disease, seizure disorder, anemia of chronic disease who presented [facility] ER on the day of admission with altered mental status after starting a new  bipolar disorder medication which caused him to miss several outpatient hemodialysis sessions. Please see dictated admission history and physical performed by Dr. Larena GlassmanAmir Firozvi for pertinent details surrounding the onset of this hospitalization. Please see below for further details.  1. Altered mental status-Initial culprit was felt to be starting new bipolar medication, fluphenazine, causing him to miss several outpatient hemodialysis sessions which led to uremic encephalopathy and resultant mental status change. Fluphenazine has been discontinued by psychiatry. He was admitted to the hospital and started on urgent hemodialysis. He was also noted to be hyperkalemic on admission in addition to having uremic encephalopathy. After discontinuing fluphenazine and after dialyzing the patient his mental status has returned back to baseline. Fluphenazine has been discontinued altogether from his regimen.  2. Hyperkalemia-From missing outpatient hemodialysis. Serum potassium level has normalized after reinitiation of hemodialysis. 3. End-stage renal disease-Patient on hemodialysis every Tuesday, Thursday, Saturday as an outpatient, had missed several dialysis sessions due to mental status change caused by fluphenazine. Patient was admitted and nephrology was consulted and patient underwent hemodialysis as per Dr. Doristine ChurchSingh's recommendations and with doing so his altered mental status resolved and his hyperkalemia did as well and he will need ongoing outpatient hemodialysis Tuesday, Thursday, Saturday and close outpatient follow up with nephrology. 4. Seizure disorder-With supratherapeutic Dilantin level for which his dose has been reduced and he will also continue Trileptal and should be maintained on seizure precautions. 5. Anemia of chronic disease, also now with pancytopenia-Patient currently not neutropenic. For his anemia, this is chronic, likely secondary to anemia of chronic kidney disease and there are no acute  indications for transfusion of blood and he can receive Procrit with hemodialysis as per nephrology recommendations. In regards to his thrombocytopenia, there is no obvious bleeding and there is no indication for platelet transfusion. Coumadin should be used with caution. In regards to pancytopenia, patient was non-neutropenic and afebrile. He will be referred to heme clinic as an outpatient for further evaluation of pancytopenia which could be medication induced as he is on several medications at baseline. Exact cause is unclear at this time and he will be referred to the heme clinic for further work-up.  6. History of dialysis access thrombosis-Patient maintained on Coumadin at baseline. His INR has been therapeutic and this will be continued for now.  7. History of bipolar disorders-Patient was seen by psychiatrist, Dr. Maryruth BunKapur, who was in agreement with discontinuing fluphenazine given mental status change. Dr. Maryruth BunKapur started patient on Haldol prior to hemodialysis as well as Celexa and Effexor has been discontinued as it has renal clearance and patient has end-stage renal disease. Patient to continue Seroquel as well and dose has been adjusted and he will be receiving Seroquel XR 300 mg p.o. b.i.d. scheduled in addition to 50 mg p.o. q.8 hours p.r.n. and Dr. Maryruth BunKapur recommended having patient follow up with Dr. Francena Hanlyobert Williams of psychiatry as an outpatient. Patient currently has been tolerating changes to his psychotropic medications well thus far and has been non-agitated and nonsedated.  8. Hypothyroidism-With elevated TSH and low free T4. Patient is already on high dose Synthroid. Not sure if he has been truly taking it as prescribed given his mental status change. Patient currently on 237 mcg of Synthroid and  would be reluctant to increase this even more but may need to if his TSH remains persistently elevated and if his FT4 continues to remain low. Recommend rechecking his TSH within 2 to 3 weeks per  patient's primary care physician and if found to be persistently abnormal would recommend referral to endocrinology which can be done as an outpatient.  9. On 01/08/2012 patient was hemodynamically and medically stable and his mental status was at baseline and his hyperkalemia had resolved and he was felt to be stable for discharge back to skilled nursing facility with close outpatient follow up.   TIME SPENT ON DISCHARGE: Greater than 30 minutes.  ____________________________ Elon Alas, MD knl:cms D: 01/08/2012 14:39:45 ET T: 01/08/2012 15:29:10 ET  JOB#: [id number] cc: [facility] Teena Irani. Terance Hart, MD Mosetta Pigeon, M.D. and Dr. Francena Hanly  [address] Josten Warmuth MD ELECTRONICALLY SIGNED 01/13/2012 1:18

## 2015-02-18 NOTE — Op Note (Signed)
PATIENT NAME:  [name] Wiley, [name] Wiley MR#:  [id number] DATE OF BIRTH:  01-Dec-1950  DATE OF PROCEDURE:  04/16/2012  PREOPERATIVE DIAGNOSES:  1. Complication AV dialysis device.  2. Poor flows right IJ cuffed tunneled dialysis catheter.   POSTOPERATIVE DIAGNOSES:  1. Complication AV dialysis device.  2. Poor flows right IJ cuffed tunneled dialysis catheter.   PROCEDURE PERFORMED: TPA infusion cuffed tunneled dialysis catheter for catheter clearance.   SURGEON: Renford DillsGregory G Larron Armor, M.D.   DESCRIPTION OF PROCEDURE: The patient is brought to Special Procedures holding area. Two aliquots of 2 mg of TPA per 50 mL are reconstituted. The catheter is verified to have poor flow by manual aspiration and subsequently TPA infusion simultaneously through both lumens is performed for 2-1/2 to 3 hours.   Following TPA infusion, both lumens aspirate quite easily. They are flushed with saline and then packed with 5000 units of heparin. The patient tolerated the procedure. No immediate complications.   ____________________________ Renford DillsGregory G. Markayla Reichart, MD ggs:bjt D: 04/16/2012 11:03:20 ET T: 04/16/2012 13:21:10 ET JOB#: [id number]  cc: Renford DillsGregory G. Talley Kreiser, MD, <Dictator> Renford DillsGREGORY G Earline Stiner MD ELECTRONICALLY SIGNED 04/22/2012 10:03

## 2015-02-19 LAB — POTASSIUM: Potassium: 5.6 mmol/L — ABNORMAL HIGH

## 2015-02-19 LAB — HEMOGLOBIN: HGB: 8.2 g/dL — ABNORMAL LOW (ref 13.0–18.0)

## 2015-02-19 LAB — PHOSPHORUS: Phosphorus: 5.2 mg/dL — ABNORMAL HIGH

## 2015-02-25 NOTE — Discharge Summary (Signed)
Dates of Admission and Diagnosis:  Date of Admission 25-Dec-2014   Date of Discharge 26-Dec-2014   Admitting Diagnosis Hyperkalemia   Final Diagnosis 1. Hyperkalemia 2. ESRD on HD 3. hypertension  4. Seziure disorder 5. GERD    Chief Complaint/History of Present Illness CHIEF COMPLAINT: Severe hyperkalemia, missed hemodialysis.   HISTORY OF PRESENT ILLNESS: The patient is a 64 year old African American male with a history of end-stage renal disease, hypothyroidism, mental retardation, hypertension, and history of seizure in the past, chronic anemia, who apparently has not been dialyzed for the past 1 week due to having difficulties with fistula. The patient is unable to give any history. He chronically mumbles, which is hard to understand. He was sent to the ED, had evaluation including a BMP which showed a potassium of 7. The ED physician just gave 1 dose of calcium gluconate and asked [place] to admit the patient. I have contacted Dr. Mady Haagensen  who will be attempting to dialyze the patient. If they are unable dialyze him, then they will get a vascular consult.   Allergies:  No Known Allergies:   Pertinent Past History:  Pertinent Past History PAST MEDICAL HISTORY:  1.  End-stage renal disease, Tuesday, Thursday, Saturday dialysis.  2.  History of unsteady gait.  3.  History of hypothyroidism.  4.  Mental retardation.  5.  Hyperparathyroidism.  6.  Hypertension.  7.  Seizure disorder.  8.  Anemia.   PAST SURGICAL HISTORY: Status post pacemaker placement.   Hospital Course:  Hospital Course pt is 40 y,o aam with esrd on hd  missed hd for 1 wk due to trouble with fistual finally send [facility]  1. severe hyperkalmia- kaxylate, ca gluconate,  2 rounds of HD in hospital. No issues with fistula.  3. axiety - klonipine, haloperidol and seroquel 4. gerd ppi 5. hypothyrdism- syntrhoid  Normal K and labs improved. pt is at baseline now and dsicharge  back to [facility].  Time spent on discharge 40 min   Condition on Discharge Fair   Code Status:  Code Status Full Code   PHYSICAL EXAM ON DISCHARGE:  Physical Exam:  GEN no acute distress   RESP normal resp effort   CARD regular rate   PSYCH alert   VITAL SIGNS:  Vital Signs: **Vital Signs.:   01-Mar-16 08:03  Vital Signs Type Q 8hr  Temperature Temperature (F) 97.5  Celsius 36.3  Temperature Source oral  Pulse Pulse 78  Respirations Respirations 18  Systolic BP Systolic BP 143  Diastolic BP (mmHg) Diastolic BP (mmHg) 82  Mean BP 102  Pulse Ox % Pulse Ox % 96  Pulse Ox Activity Level  At rest  Oxygen Delivery Room Air/ 21 %   DISCHARGE INSTRUCTIONS HOME MEDS:  Medication Reconciliation: Patient's Home Medications at Discharge:     Medication Instructions  benztropine 1 mg oral tablet  1 tab(s) orally 2 times a day   citalopram 20 mg oral tablet  1 tab(s) orally once a day (in the morning)   phenytoin 100 mg oral capsule, extended release  3 cap(s) orally once a day (at bedtime)   fish oil 1000 mg oral capsule  1 cap(s) orally 2 times a day   folic acid 0.4 mg oral tablet  2 tab(s) orally once a day (in the morning)   haloperidol 5 mg oral tablet  1 tab(s) orally once a day (in the morning--on Tuesday, Thursday, and Saturday)   ferrous fumarate 324  mg (106 mg elemental iron) oral tablet  1 tab(s) orally once a day (in the morning)   clonazepam 0.5 mg oral tablet  1 tab(s) orally 2 times a day   levothyroxine 300 mcg (0.3 mg) oral tablet  2 tab(s) orally once a day (in the evening)   metolazone 2.5 mg oral tablet  1 tab(s) orally once a day (in the morning)   omeprazole 20 mg oral delayed release tablet  1 tab(s) orally once a day (in the morning)   acetaminophen-oxycodone 325 mg-5 mg oral tablet  1 tab(s) orally every 6 hours, As Needed - for Pain   calcium acetate 667 mg oral capsule  2 cap(s) orally 3 times a day (with meals)   rena-vite vitamin b complex  with c and folic acid oral tablet  1 tab(s) orally once a day (in the morning)   sensipar 90 mg oral tablet  1 tab(s) orally once a day (with lunch)   seroquel xr 300 mg oral tablet, extended release  1 tab(s) orally 2 times a day   seroquel xr 50 mg oral tablet, extended release  1 tab(s) orally every 8 hours, As Needed - for Agitation   oxcarbazepine 600 mg oral tablet  1 tab(s) orally 2 times a day   vitamin d3 1000 intl units oral tablet  3 tab(s) orally once a day (in the morning)   aspirin 81 mg oral tablet  1 tab(s) orally once a day (in the morning)   phenytoin 100 mg oral capsule, extended release  1 cap(s) orally once a day (in the morning)    PRESCRIPTIONS: PRINTED AND PLACED ON CHART   Physician's Instructions:  Diet Renal Diet   Activity Limitations As tolerated   Return to Work Not Applicable   Time frame for Follow Up Appointment 1-2 weeks  Nephrology   Electronic Signatures: Roseanna Koplin, Andreas BlowerSrikar Reddy (MD)  (Signed 01-Mar-16 10:53)  Authored: ADMISSION DATE AND DIAGNOSIS, CHIEF COMPLAINT/HPI, Allergies, PERTINENT PAST HISTORY, HOSPITAL COURSE, PHYSICAL EXAM ON DISCHARGE, VITAL SIGNS, DISCHARGE INSTRUCTIONS HOME MEDS, PATIENT INSTRUCTIONS   Last Updated: 01-Mar-16 10:53 by Minette HeadlandSudini, Nehemie Casserly Reddy (MD)

## 2015-02-25 NOTE — Discharge Summary (Signed)
PATIENT NAME:  [name] Wiley, [name] MR#:  [id number] DATE OF BIRTH:  [birth year]  DATE OF ADMISSION:  02/17/2015 DATE OF DISCHARGE:  02/19/2015  HISTORY OF PRESENT ILLNESS:  For detailed note, please see the history and physical done on admission by Dr. Katharina Caperima Vaickute.   DIAGNOSES AT DISCHARGE:  Complication of a left upper extremity arteriovenous fistula; end-stage renal disease, on hemodialysis; hyperkalemia; hypothyroidism; secondary hyperparathyroidism; history of seizures; history of bipolar disorder.   DIET:  The patient is being discharged on a low-sodium, low-fat, renal diet.   ACTIVITY:  As tolerated.   FOLLOWUP:  With Dr. Terance HartBronstein in the next 1-2 weeks.     DISCHARGE MEDICATIONS:  Benztropine 1 mg b.i.d.; Celexa 20 mg daily; Dilantin 100 mg 3 capsules at bedtime; fish oil 1000 mg b.i.d.; folic acid 0.4 mg 2 tabs daily; Haldol 5 mg 1 tab in the morning on Tuesday, Thursday, Saturday; ferrous fumarate 324 mg 1 tablet daily; Synthroid 300 mcg 2 tabs daily; metolazone 2.5 mg daily; omeprazole 20 mg daily; calcium acetate 667 mg capsule 2 caps t.i.d. with meals; renal multivitamin 1 tab daily; Sensipar 90 mg daily; Seroquel 300 mg extender release b.i.d.; Seroquel 50 mg extended release q. 8 hours as needed; oxcarbazepine 600 mg b.i.d.; vitamin D3 1000 International Units 3 tablets daily; aspirin 81 mg daily; Klonopin 0.5 mg b.i.d.; Tylenol with oxycodone 325/5 one tab q. 6 hours as needed; Dilantin 100 mg in the morning daily.   CONSULTANTS DURING THE HOSPITAL COURSE:  Dr. Festus BarrenJason Dew from vascular surgery, Dr. Mosetta PigeonHarmeet Singh from nephrology.   PERTINENT STUDIES DONE DURING THE HOSPITAL COURSE:  A chest x-ray done on admission showing stable cardiomegaly.  No acute cardiopulmonary disease.   HOSPITAL COURSE:  This is a 64 year old male who presented to the hospital due to a complication on his right upper extremity AV fistula, which had clotted off. 1. Complication of his AV fistula.  Patient  apparently presented with a clot to his right upper extremity AV fistula.  The patient was seen by vascular surgery.  They placed a temporary access for dialysis as he was mildly hyperkalemic.  On April 25, the patient underwent successful declotting of his AV fistula and is therefore now being discharged back to his skilled nursing facility.  2. End-stage renal disease, on hemodialysis.  The patient was noted to be mildly hyperkalemic, therefore underwent dialysis using a temporary dialysis catheter, which is now being removed as his permanent right upper extremity AV fistula has now been declotted and they can use that for his scheduled dialysis on Tuesday, Thursday, and Saturday.  3. Hyperkalemia.  This was a result of the end-stage renal disease.  The patient's potassium has improved with intermittent dialysis while in the hospital.  4. Secondary hyperparathyroidism.  The patient was maintained on his Sensipar.  He will resume that.  5. Hypothyroidism.  The patient was maintained on his Synthroid.  He will resume that.  6. History of bipolar disorder with history traumatic brain injury.  The patient will continue his Seroquel, Haldol and Cogentin.   7. History of seizures.  The patient will continue his Trileptal and Dilantin as stated.   CODE STATUS:  The patient is a full code.   TIME SPENT:  Thirty-five minutes.    ____________________________ Rolly PancakeVivek J. Cherlynn KaiserSainani, MD vjs:kc D: 02/19/2015 18:07:32 ET T: 02/19/2015 18:34:43 ET JOB#: [id number]  cc: Rolly PancakeVivek J. Cherlynn KaiserSainani, MD, <Dictator> Teena Iraniavid M. Terance HartBronstein, MD [place] SirenVIVEK J Ebubechukwu Jedlicka MD ELECTRONICALLY SIGNED 02/20/2015 16:51

## 2015-02-25 NOTE — Consult Note (Signed)
patient with no venous acess and no dialysis for several days.put in a temp cath for access and to check labsneed HD and then likely can go home tomorrowperform declot on Monday  Electronic Signatures: Annice Needyew, Jason S (MD)  (Signed on 22-Apr-16 17:43)  Authored  Last Updated: 22-Apr-16 17:43 by Annice Needyew, Jason S (MD)

## 2015-02-25 NOTE — H&P (Addendum)
PATIENT NAME:  [name] Wiley, [name] MR#:  [id number] DATE OF BIRTH:  [birth year]  DATE OF ADMISSION:  02/16/2015  PRIMARY CARE PHYSICIAN:  Dr. Dorothey Basemanavid Bronstein.   HISTORY OF PRESENT ILLNESS:  The patient is to 64 year old African-American male with history of end-stage renal disease on hemodialysis via right upper extremity AV fistula who presents to the hospital with nonfunctioning clotted right upper extremity graft and missing 2 hemodialysis sessions Tuesday as well as Thursday.  He was urged by his brother to come to the Emergency Room for further evaluation.  He was seen by Dr. Wyn Quakerew who recommended to place a temporary hemodialysis catheter to the right groin and have him hemodialyzed and then discharge him after hemodialysis is complete.  He is planning to have hemodialysis access and right upper extremity declotted on Monday, a few days after discharge.    PAST MEDICAL HISTORY:  Significant for history of end-stage renal disease with Tuesday, Thursday, and Saturday dialysis via right upper extremity graft,  history of unsteady gait, history of hypothyroidism, mitral regurgitation, hypoparathyroidism, hypertension, seizure disorder, anemia, and bipolar disorder.  History of questionable stroke and hypertension.  PAST SURGICAL HISTORY:  Pacemaker placement as well as graft placement in the right upper extremity.   ALLERGIES:  None.   MEDICATIONS:  Unfortunately reconciliation was not performed, so his prior medication list is as follows:  Acetaminophen/ oxycodone 325 mg/5 mg 1 tablet every 6 hours as needed, aspirin 81 mg p.o. daily, benztropine 1 mg  mg twice daily, calcium acetate 667 mg 2 capsules 3 times daily with meals, citalopram 10 mg p.o. daily, clonazepam 0.5 mg twice daily, iron fumarate 324 mg once daily,  fish oil 1 gram twice daily, folic acid 0.4 mg 2 capsules once daily, haloperidol 5 mg once a day on Tuesdays, Thursdays, and Saturdays, levothyroxine 300 mcg 2 tablets once in the evening,   Metolazone 2.5 mg daily, omeprazole 20 mg daily, oxcarbazepine 300 mg p.o. twice daily, phenytoin 100 mg extended release once in the morning and 300 mg at bedtime, multivitamins with vitamin B complex and C as well as folic acid once daily, Sensipar  90 mg p.o. daily, Seroquel XR 300 mg p.o. twice daily and 50 mg p.o. as needed every 8 hours for agitation, vitamin D3 1000 units 3 capsules once daily.  It is unclear if he is still taking the same medications.   ALLERGIES:  None.   SOCIAL HISTORY:  The patient is not able to give history.  According to medical records, he was a nonsmoker as well as nondrinker.     FAMILY HISTORY:  Positive for hypertension.   REVIEW OF SYSTEMS: Not available due to patient's inability to respond to questions.     PHYSICAL EXAMINATION: VITAL SIGNS:   Temperature was not measured, pulse 70, respirations 27, blood pressure 173/79, saturations 94 to 96% on room air.  GENERAL:  This is a well-developed, well-nourished African-American male in no significant distress sitting on the stretcher.  He is moving freely.  He is not in any distress and no shortness of breath.   HEENT:  His pupils are equal and react to light.  Extra occular movements intact,   normal  hearing.  No pharyngeal erythema.  Mucosa is moist.  NECK:  No masses.  Supple, nontender.  Thyroid not enlarged.  No adenopathy.  No JVD or carotid bruits bilaterally.  Full range of motion.  LUNGS:  Clear to auscultation anteriorly.  A few rhonchi were heard posteriorly.  No  wheezing or labored inspirations.  No dullness to percussion, or overt respiratory distress.  CARDIOVASCULAR:  S1, S2 rythm is regular, no murmurs, gallops or rubs.   Chest is nontender to palpation, 1+ pedal pulses, 1-2+ lower extremity edema.  No calf tenderness or cyanosis was noted.  ABDOMEN:  Soft, nontender.  Bowel sounds are present.  No hepatosplenomegaly or masses were noted.  RECTAL:  Deferred.   EXTREMITIES:   Muscle strength is  5/5, no cyanosis, degenerative joint disease or kyphosis. SKIN: No rashes, lesions, erythema, nodularity   in the lower extremities.  The skin was otherwise warm and dry to palpation.  The patient does have right upper extremity AV graft.  No adenopathy in the cervical region.  Of note, the patient's AV graft has no thrill  or bruit.    NEUROLOGIC:  Cranial nerves grossly intact.  Sensory is not obtainable as the patient is noncompliant.  The patient is dysarthric.   He is alert and not cooperative.  Memory is difficult and not able to be evaluated.  Not agitated, but restless.    LABORATORY DATA:  Laboratory studies are pending.  EKG is pending.   ASSESSMENT AND PLAN:  1.  Thrombosed failed access of hemodialysis graft.  The patient just had a temporary hemodialysis catheter placed in the right groin by Dr. Wyn Quaker, after which the patient will receive hemodialysis and will be informing nephrologist on call following his laboratory data, especially potassium levels.  After that, Dr. Wyn Quaker recommended to get temporary catheter discontinued and discharge patient home for him to arrive on Monday of next week for hemodialysis graft declotting which can be done as an outpatient.   2.   History of hypertension and patient's  elevated blood pressure on admission.   Continue patient's medications and followup after hemodialysis.  3.  Hypothyroidism.  Continue Synthroid.  Negative for parathyroidism.  Continue outpatient medications.    4.  History of seizure disorder.  Continue outpatient medications, no changes will be made at this time unless the patient's medication list is not the same.    TIME SPENT:  50 minutes.      ____________________________ Katharina Caper, MD rv:[id number] D: 02/16/2015 18:17:00 ET T: 02/16/2015 19:08:21 ET JOB#: [id number]  cc: Katharina Caper, MD, <Dictator> Kylin Genna MD ELECTRONICALLY SIGNED 02/23/2015 19:21

## 2015-02-25 NOTE — Op Note (Signed)
PATIENT NAME:  [name] Wiley, [name] MR#:  [id number] DATE OF BIRTH:  1951/02/11  DATE OF PROCEDURE:  02/19/2015  PREOPERATIVE DIAGNOSES:  1.  End-stage renal disease.  2.  Clotted right arm arteriovenous graft.  3.  Hyperkalemia, improved with dialysis.  4.  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  5.  Hypertension.  POSTOPERATIVE DIAGNOSES: 1.  End-stage renal disease.  2.  Clotted right arm arteriovenous graft.  3.  Hyperkalemia, improved with dialysis.  4.  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  5.  Hypertension.    PROCEDURES:  1.  Ultrasound guidance to the right arm arteriovenous graft in both antegrade and retrograde fashion crossing.  2.  Right upper extremity shuntogram and central venogram.  3.  Catheter-directed thrombolysis with 4 mg of tissue plasminogen activator with the AngioJet AVX catheter.  4.  Fogarty embolectomy of the graft.  5.  Percutaneous transluminal angioplasty of arterial anastomosis for residual arterial plug after thrombolysis and embolectomy with a 7 mm diameter high-pressure angioplasty balloon.  6.  Percutaneous transluminal angioplasty of mid and distal graft for residual stenosis and thrombus with a 7 mm diameter angioplasty balloon.  7.  Percutaneous transluminal angioplasty of axillary subclavian vein for residual stenosis and thrombus after thrombectomy with a 10 mm diameter angioplasty balloon.   SURGEON:  [name] NeedyJason S. Justyne Roell, MD   ANESTHESIA:  Local with moderate conscious sedation.   ESTIMATED BLOOD LOSS:  Minimal.   INDICATION FOR PROCEDURE:  This is a 64 year old gentleman with end-stage renal disease. His graft was clotted and we are attempting to salvage this. Risks and benefits were discussed. Informed consents obtained.   DESCRIPTION OF PROCEDURE:  The patient was brought to the vascular suite. The right upper extremity was sterilely prepped and draped and a sterile surgical field was created. The graft was pulseless.  Ultrasound was necessary to access this in both an antegrade and retrograde fashion in a crossing orientation to be able to treat the entirety of the graft. This was done under direct ultrasound guidance with a micropuncture needle and permanent image was recorded in both directions. A micropuncture wire and sheath were placed and we upsized to a 6 French sheath and gave 3000 units of intravenous heparin. Imaging showed thrombosis of the graft. I then took a Magic torque wire into the central venous circulation from the antegrade sheath and past the brachial bifurcation from the retrograde sheath. Then, 4 mg of t-PA was delivered from the arterial anastomosis throughout the graft and into the axillary vein where a previously placed stent was seen where thrombus was present initially. This was allowed to dwell for about 10 minutes. Fogarty embolectomy was used to pull clot from the retrograde sheath from the brachial artery through the proximal portion of the graft. Imaging was performed after this, which uncovered a significant residual arterial plug, which I treated with a 7 mm diameter high-pressure angioplasty balloon.   After this, the arterial plug was resolved and the proximal portion of the graft was now widely patent, but the mid graft was thrombosed and there appeared to be a significant stenosis and thrombus very near the venous access site, which is where the retrograde sheath was placed. The  retrograde sheath was then removed and a 4-0 Monocryl pursestring suture was placed. I treated  the mid and distal portion of the graft with a 7 mm diameter high-pressure angioplasty balloon. A very significant waist was seen, which resolved at about 12 to 14 atmospheres  and inflation was held for about 45 seconds. The balloon was then deflated. The graft was now patent, but there were some residual stenosis and apparent thrombus up on the axillary and subclavian veins. He had covered stents placed previously well  beyond the venous anastomosis separate and distinct from the graft itself. For this lesion, that was about a 60% residual stenosis, I upsized to a 10 mm diameter angioplasty balloon in the axillary and subclavian veins. This lesion was treated with about a 15% to 20% residual stenosis, which was not flow limiting.   At this point, there is no residual thrombus seen within the graft. The remainder of the central venous circulation was patent and there was an excellent thrill in the graft. I elected to terminate the procedure. The second sheath was removed and a 4-0 Monocryl pursestring suture was placed. Pressure was held. Sterile dressings were placed. The patient tolerated the procedure well and was taken to the recovery room in stable condition.    ____________________________ [name] Needy, MD jsd:TT D: 02/19/2015 17:36:09 ET T: 02/20/2015 04:27:20 ET JOB#: [id number]  cc: [name] Needy, MD, <Dictator> [name] Needy MD ELECTRONICALLY SIGNED 02/23/2015 17:27

## 2015-02-25 NOTE — Op Note (Signed)
PATIENT NAME:  [name] Wiley, [name] MR#:  [id number] DATE OF BIRTH:  [birth year]  DATE OF PROCEDURE:  02/16/2015  PREOPERATIVE DIAGNOSIS:    1. End-stage renal disease.  2. Clotted right arm arteriovenous graft.  3. Hypertension.  4. Mental retardation.   POSTOPERATIVE DIAGNOSIS:   1. End-stage renal disease.  2. Clotted right arm arteriovenous graft.  3. Hypertension.  4. Mental retardation.   PROCEDURES:  1. Ultrasound guidance for vascular access to the right femoral vein.  2. Placement of non-tunneled dialysis catheter into the right femoral vein.  SURGEON: Annice NeedyJason S. Dew, MD  ANESTHESIA: Local.   BLOOD LOSS: Minimal.   INDICATION FOR PROCEDURE: This is a 64 year old gentleman, well-known to us for his dialysis access needs. He has missed his most recent dialysis. He has not had dialysis in several days. He presents with needing dialysis access, as his access is clotted. They are unable to even get labs for a potassium to see if a thrombolysis and thrombectomy procedure would be prudent or safe. We will place a temporary dialysis catheter with a third lumen for central venous access to get him dialysis and we will plan to do his declot procedure in the near future. This was discussed with his brother, who helps make his medical decisions, as the patient himself is unable to. Informed consent was obtained.   DESCRIPTION OF THE PROCEDURE: The patient's right groin was sterilely prepped and draped and a sterile surgical field was created. The right femoral vein was visualized with ultrasound and found to be patent. It was then accessed under direct ultrasound guidance without difficulty with a Seldinger needle and a permanent image was recorded. A J-wire was placed after skin nick and dilatation. The 30 cm Trialysis dialysis catheter was then placed over the wire and the wire was removed. All lumens withdrew blood well and flushed easily with sterile saline. It was secured to the skin with 3 nylon  sutures. A sterile dressing was placed. The patient tolerated the procedure well and remained in his bed in a stable condition.   ____________________________ Annice NeedyJason S. Dew, MD jsd:AT D: 02/16/2015 18:04:14 ET T: 02/16/2015 22:11:54 ET JOB#: [id number]  cc: Annice NeedyJason S. Dew, MD, <Dictator> Annice NeedyJASON S DEW MD ELECTRONICALLY SIGNED 02/21/2015 13:53

## 2015-02-25 NOTE — Consult Note (Signed)
PATIENT NAME:  [name] Wiley, [name] Wiley MR#:  [id number] DATE OF BIRTH:  [birth year]  DATE OF CONSULTATION:  02/16/2015  REFERRING PHYSICIAN:   CONSULTING PHYSICIAN:  Annice Needy, MD  REASON FOR CONSULTATION: Thrombosed dialysis access.   HISTORY OF PRESENT ILLNESS: This is a 64 year old gentleman who resides in a nursing facility who has cognitive issues since a childhood trauma, who has been on dialysis for many years. He presented today with a thrombosed graft. He has not had dialysis in several days. He apparently did not go to his last dialysis treatment, per his brother. He has come to the hospital. Despite multiple attempts, the nursing staff has been unable to gain an IV or labs to evaluate his levels. We are planning on getting dialysis access for the patient and we are asked to see him as such. He provides very little history due to his cognitive issues and this is obtained from the previous medical record or his brother.   PAST MEDICAL HISTORY:  1.  End-stage renal disease.  2.  Hypertension.  3.  Hypothyroidism.  4.  Seizure disorder.  5.  Possible history of stroke.   PAST SURGICAL HISTORY:  1.  Multiple previous dialysis accesses with a currently clotted access in the right upper extremity.  2.  Pacemaker placement.   ALLERGIES: None known.   MEDICATIONS: Include:  1.  Vitamin D 1000 international units 3 tabs daily.  2.  Seroquel 300 mg b.i.d. and 50 mg q. 8 hours p.r.n.   3.  Sensipar 90 mg daily.  4.  Rena-Vite daily.  5.  Phenytoin 100 mg daily.  6.  Phenytoin 300 mg at bedtime.  7.  Oxcarbazepine 600 mg b.i.d.  8.  Omeprazole 20 mg daily.  9.  Metolazone 2.5 mg daily.  10.   Levothyroxine 600 mcg daily.  11.   Haldol as needed.  12.   Folic acid 0.8 mg daily.  13.   Fish oil 1000 mg b.i.d.  14.   Ferrous fumarate 324 mg daily.  15.   Citalopram 20 mg daily.    16.   Calcium acetate 667 mg 2 capsules t.i.d. with meals.  17.   Benztropine 1 mg b.i.d.  18.   Aspirin 81  mg daily.   ALLERGIES: None known.   FAMILY HISTORY: Positive for hypertension and diabetes in multiple family members.   SOCIAL HISTORY: No recorded history of tobacco or alcohol abuse. He lives in a skilled nursing facility.   REVIEW OF SYSTEMS: Not obtainable due to the patient's inability to answer questions appropriately.   PHYSICAL EXAMINATION: VITAL SIGNS: Afebrile. Pulse 70, blood pressure is 173/79, saturations are in the mid-90s on room air.  HEENT: Normocephalic and atraumatic. Eyes: Sclerae are anicteric. Conjunctivae are clear. Ears: Normal external appearance. Hearing appears to be intact.  NECK: Supple without adenopathy or JVD.  HEART: Regular rate and rhythm with no murmurs. No JVD.  LUNGS: Clear bilaterally with normal respiratory effort.  ABDOMEN: Soft, nondistended, nontender.  EXTREMITIES: He has very mild lower extremity edema, 1+. Good capillary refill. Moves all 4 extremities.  PSYCHIATRIC: He is somewhat agitated, difficult to assess given his cognitive issues. He is alert, not really oriented.  SKIN: Warm and dry.  DIALYSIS ACCESS: He has a clotted right arm graft in the right upper extremity without bruit or thrill.   ASSESSMENT:  1.  End-stage renal disease.  2.  Thrombosed right arm graft.  3.  Hypertension.  4.  Seizure disorder.  5.  History of mental retardation/cognitive issues after trauma as a child.  PLAN: Get venous access for both laboratories and IV fluids, as well as dialysis. Have placed a Trialysis catheter in his groin at bedside. He will be admitted to the hospital and dialysis will be given based off his labs either tonight or tomorrow. He will be scheduled for attempt at thrombectomy and thrombolysis of his right arm graft on Monday, April 25, if his potassium is down and he is stable medically for this. The internal medicine service is admitting the patient and will control his blood pressure and seizure medications. This is a level 3  consultation.   ____________________________ Annice NeedyJason S. Mearl Olver, MD jsd:at D: 02/21/2015 14:18:56 ET T: 02/21/2015 15:21:33 ET JOB#: [id number]  cc: Annice NeedyJason S. Jaaziah Schulke, MD, <Dictator> Annice NeedyJASON S Barrie Wale MD ELECTRONICALLY SIGNED 02/23/2015 17:27

## 2015-02-25 NOTE — H&P (Signed)
PATIENT NAME:  [name] Wiley, [name] Wiley MR#:  [id number] DATE OF BIRTH:  [birth year]  DATE OF ADMISSION:  12/25/2014  PRIMARY CARE PROVIDER: Teena Irani. Terance Hart, MD  EMERGENCY DEPARTMENT REFERRING PHYSICIAN:  Eartha Inch. York Cerise, MD  CHIEF COMPLAINT: Severe hyperkalemia, missed hemodialysis.   HISTORY OF PRESENT ILLNESS: The patient is a 64 year old African American male with a history of end-stage renal disease, hypothyroidism, mental retardation, hypertension, and history of seizure in the past, chronic anemia, who apparently has not been dialyzed for the past 1 week due to having difficulties with fistula. The patient is unable to give any history. He chronically mumbles, which is hard to understand. He was sent to the ED, had evaluation including a BMP which showed a potassium of 7. The ED physician just gave 1 dose of calcium gluconate and asked [place] to admit the patient. I have contacted Dr. Mady Haagensen  who will be attempting to dialyze the patient. If they are unable dialyze him, then they will get a vascular consult.   PAST MEDICAL HISTORY:  1.  End-stage renal disease, Tuesday, Thursday, Saturday dialysis.  2.  History of unsteady gait.  3.  History of hypothyroidism.  4.  Mental retardation.  5.  Hyperparathyroidism.  6.  Hypertension.  7.  Seizure disorder.  8.  Anemia.   PAST SURGICAL HISTORY: Status post pacemaker placement.   ALLERGIES: None.   CURRENT MEDICATIONS: According to his list available from the facility: Metolazone 2.5 daily, PhosLo 2 capsules with snacks, Trileptal 600, 1 tab p.o. b.i.d., vitamin D3, 1000 International Units daily, folic acid 0.4, 2 tabs daily, Dilantin 100 mg daily, Klonopin 0.5 b.i.d., > 2 caps at lunchtime, Seroquel XR 300 mg 1 tab p.o. daily, Rena-Vite 1 tab p.o. daily, haloperidol 5 mg Tuesday, Thursday, Saturday, fish oil 1000 mg b.i.d., omeprazole 20, 1 tab p.o. daily, levothyroxine 300 mcg daily.   SOCIAL HISTORY: The patient unable to give me any  history, but does not appear to be a smoker or drinker.   FAMILY HISTORY: Positive for hypertension.  REVIEW OF SYSTEMS: Unobtainable due to patient's baseline mental retardation.   PHYSICAL EXAMINATION: VITAL SIGNS: Temperature 98.1, pulse 71, respirations 22, blood pressure 141/62, O2 of 97%.  GENERAL: The patient is a well-developed male in no acute distress.  HEENT: Head atraumatic, normocephalic. Pupils equally round, reactive to light and accommodation. There is no conjunctival pallor. No sclerae icterus. Nasal exam shows no drainage or ulceration. Oropharynx is clear without any exudate.  NECK: Supple without any thyromegaly.  CARDIOVASCULAR: Regular rate and rhythm. No murmurs, rubs, clicks, or gallops.  ABDOMEN: Soft, nontender, nondistended. Positive bowel sounds x 4.  EXTREMITIES: Has edema of the both lower extremities.  SKIN: No rash.  LYMPH NODES: Nonpalpable.  MUSCULOSKELETAL: There is no erythema or swelling.  VASCULAR: Good DP, PT pulses.  PSYCHIATRIC: Not anxious or depressed.  NEUROLOGIC:  Cranial nerves II-XII grossly intact. No focal deficits.   LABORATORY DATA: Glucose 100, BUN 113, creatinine 14.41, sodium 134, potassium 7.0, chloride 104, CO2 of 21, calcium 7.8, magnesium 2.6. LFTs, total protein 5.8, albumin 2.7, bilirubin total 0.4, alkaline phosphatase 353. WBC 3.9, hemoglobin 8.6, platelet count 97,000.   EKG shows left axis deviation, nonspecific interventricular block.   ASSESSMENT AND PLAN:  1.  The patient is a 64 year old African American male with end-stage renal disease, on hemodialysis, missed hemodialysis for 1 week due to trouble with fistula, sent here for further evaluation and hemodialysis.  2.  Severe hyperkalemia. At this time  he has received calcium gluconate. I will give him a dose of Kayexalate. I have spoken to nephrology for urgent hemodialysis. 3.  End-stage renal disease, hemodialysis as above.  4.  Anxiety. Continue Klonopin,  haloperidol, and Seroquel.  5.  Gastroesophageal reflux disease. Will continue proton pump inhibitors.  6.  Hypothyroidism. Continue Synthroid.   The patient's potassium is critical, at high risk of cardiopulmonary arrest. He is a critically ill patient.   TIME SPENT: 60 minutes.    ____________________________ Lacie ScottsShreyang H. Allena KatzPatel, MD shp:LT D: 12/25/2014 15:39:31 ET T: 12/25/2014 17:07:51 ET JOB#: [id number]  cc: Rama Mcclintock H. Allena KatzPatel, MD, <Dictator> Charise CarwinSHREYANG H Kesi Perrow MD ELECTRONICALLY SIGNED 12/29/2014 15:21

## 2015-03-03 ENCOUNTER — Encounter: Payer: Self-pay | Admitting: Emergency Medicine

## 2015-03-03 ENCOUNTER — Inpatient Hospital Stay
Admission: EM | Admit: 2015-03-03 | Discharge: 2015-03-06 | DRG: 314 | Disposition: A | Discharge status: Skilled Nursing Facility | Payer: Medicare Other | Attending: Specialist | Admitting: Specialist

## 2015-03-03 ENCOUNTER — Inpatient Hospital Stay: Payer: Medicare Other

## 2015-03-03 DIAGNOSIS — G40909 Epilepsy, unspecified, not intractable, without status epilepticus: Secondary | ICD-10-CM | POA: Diagnosis present

## 2015-03-03 DIAGNOSIS — I120 Hypertensive chronic kidney disease with stage 5 chronic kidney disease or end stage renal disease: Secondary | ICD-10-CM | POA: Diagnosis present

## 2015-03-03 DIAGNOSIS — Z992 Dependence on renal dialysis: Secondary | ICD-10-CM

## 2015-03-03 DIAGNOSIS — Y832 Surgical operation with anastomosis, bypass or graft as the cause of abnormal reaction of the patient, or of later complication, without mention of misadventure at the time of the procedure: Secondary | ICD-10-CM | POA: Diagnosis present

## 2015-03-03 DIAGNOSIS — T82818A Embolism of vascular prosthetic devices, implants and grafts, initial encounter: Principal | ICD-10-CM | POA: Diagnosis present

## 2015-03-03 DIAGNOSIS — E039 Hypothyroidism, unspecified: Secondary | ICD-10-CM | POA: Diagnosis present

## 2015-03-03 DIAGNOSIS — F319 Bipolar disorder, unspecified: Secondary | ICD-10-CM | POA: Diagnosis present

## 2015-03-03 DIAGNOSIS — I2510 Atherosclerotic heart disease of native coronary artery without angina pectoris: Secondary | ICD-10-CM | POA: Diagnosis present

## 2015-03-03 DIAGNOSIS — T8586XA Thrombosis due to internal prosthetic devices, implants and grafts, not elsewhere classified, initial encounter: Secondary | ICD-10-CM | POA: Diagnosis present

## 2015-03-03 DIAGNOSIS — F419 Anxiety disorder, unspecified: Secondary | ICD-10-CM | POA: Diagnosis present

## 2015-03-03 DIAGNOSIS — T82898A Other specified complication of vascular prosthetic devices, implants and grafts, initial encounter: Secondary | ICD-10-CM

## 2015-03-03 DIAGNOSIS — R0989 Other specified symptoms and signs involving the circulatory and respiratory systems: Secondary | ICD-10-CM | POA: Diagnosis present

## 2015-03-03 DIAGNOSIS — Z8249 Family history of ischemic heart disease and other diseases of the circulatory system: Secondary | ICD-10-CM

## 2015-03-03 DIAGNOSIS — N2581 Secondary hyperparathyroidism of renal origin: Secondary | ICD-10-CM | POA: Diagnosis present

## 2015-03-03 DIAGNOSIS — F79 Unspecified intellectual disabilities: Secondary | ICD-10-CM | POA: Diagnosis present

## 2015-03-03 DIAGNOSIS — E875 Hyperkalemia: Secondary | ICD-10-CM | POA: Diagnosis present

## 2015-03-03 DIAGNOSIS — T82868A Thrombosis of vascular prosthetic devices, implants and grafts, initial encounter: Secondary | ICD-10-CM

## 2015-03-03 DIAGNOSIS — N186 End stage renal disease: Secondary | ICD-10-CM | POA: Diagnosis present

## 2015-03-03 DIAGNOSIS — K219 Gastro-esophageal reflux disease without esophagitis: Secondary | ICD-10-CM | POA: Diagnosis present

## 2015-03-03 DIAGNOSIS — T8249XA Other complication of vascular dialysis catheter, initial encounter: Secondary | ICD-10-CM

## 2015-03-03 HISTORY — DX: Disorder of kidney and ureter, unspecified: N28.9

## 2015-03-03 HISTORY — DX: Atherosclerotic heart disease of native coronary artery without angina pectoris: I25.10

## 2015-03-03 HISTORY — DX: Gastro-esophageal reflux disease without esophagitis: K21.9

## 2015-03-03 HISTORY — DX: Unspecified convulsions: R56.9

## 2015-03-03 HISTORY — DX: Unspecified intellectual disabilities: F79

## 2015-03-03 HISTORY — DX: Essential (primary) hypertension: I10

## 2015-03-03 HISTORY — DX: Hypothyroidism, unspecified: E03.9

## 2015-03-03 HISTORY — DX: Anxiety disorder, unspecified: F41.9

## 2015-03-03 HISTORY — DX: Dependence on renal dialysis: Z99.2

## 2015-03-03 LAB — COMPREHENSIVE METABOLIC PANEL
ALBUMIN: 3.3 g/dL — AB (ref 3.5–5.0)
ALT: 7 U/L — AB (ref 17–63)
AST: 16 U/L (ref 15–41)
Alkaline Phosphatase: 290 U/L — ABNORMAL HIGH (ref 38–126)
Anion gap: 11 (ref 5–15)
BUN: 54 mg/dL — AB (ref 6–20)
CHLORIDE: 98 mmol/L — AB (ref 101–111)
CO2: 22 mmol/L (ref 22–32)
Calcium: 7.7 mg/dL — ABNORMAL LOW (ref 8.9–10.3)
Creatinine, Ser: 7.47 mg/dL — ABNORMAL HIGH (ref 0.61–1.24)
GFR calc Af Amer: 8 mL/min — ABNORMAL LOW (ref >60)
GFR calc non Af Amer: 7 mL/min — ABNORMAL LOW (ref >60)
Glucose, Bld: 82 mg/dL (ref 65–99)
Potassium: 5.2 mmol/L — ABNORMAL HIGH (ref 3.5–5.1)
Sodium: 131 mmol/L — ABNORMAL LOW (ref 135–145)
TOTAL PROTEIN: 6.1 g/dL — AB (ref 6.5–8.1)
Total Bilirubin: 0.5 mg/dL (ref 0.3–1.2)

## 2015-03-03 LAB — CBC WITH DIFFERENTIAL/PLATELET
BASOS PCT: 1 %
Basophils Absolute: 0 10*3/uL (ref 0–0.1)
Eosinophils Absolute: 0.2 10*3/uL (ref 0–0.7)
Eosinophils Relative: 5 %
HEMATOCRIT: 28 % — AB (ref 40.0–52.0)
HEMOGLOBIN: 9 g/dL — AB (ref 13.0–18.0)
LYMPHS ABS: 0.9 10*3/uL — AB (ref 1.0–3.6)
LYMPHS PCT: 25 %
MCH: 31.9 pg (ref 26.0–34.0)
MCHC: 32.3 g/dL (ref 32.0–36.0)
MCV: 98.9 fL (ref 80.0–100.0)
Monocytes Absolute: 0.6 10*3/uL (ref 0.2–1.0)
Monocytes Relative: 17 %
NEUTROS PCT: 52 %
Neutro Abs: 1.8 10*3/uL (ref 1.4–6.5)
Platelets: 119 10*3/uL — ABNORMAL LOW (ref 150–440)
RBC: 2.83 MIL/uL — AB (ref 4.40–5.90)
RDW: 17.9 % — ABNORMAL HIGH (ref 11.5–14.5)
WBC: 3.5 10*3/uL — AB (ref 3.8–10.6)

## 2015-03-03 MED ORDER — ALBUTEROL SULFATE (2.5 MG/3ML) 0.083% IN NEBU: 2.5000 mg | INHALATION_SOLUTION | RESPIRATORY_TRACT | Status: DC | PRN

## 2015-03-03 MED ORDER — PHENYTOIN SODIUM EXTENDED 100 MG PO CAPS
300.0000 mg | ORAL_CAPSULE | Freq: Every day | ORAL | Status: DC
  Administered 2015-03-03 – 2015-03-05 (×3): 300 mg via ORAL
  Filled 2015-03-03 (×3): qty 3

## 2015-03-03 MED ORDER — OMEGA-3-ACID ETHYL ESTERS 1 G PO CAPS
1.0000 g | ORAL_CAPSULE | Freq: Two times a day (BID) | ORAL | Status: DC
  Administered 2015-03-03 – 2015-03-06 (×6): 1 g via ORAL
  Filled 2015-03-03 (×6): qty 1

## 2015-03-03 MED ORDER — HALOPERIDOL 5 MG PO TABS: 5.0000 mg | ORAL_TABLET | ORAL | Status: DC

## 2015-03-03 MED ORDER — BENZTROPINE MESYLATE 1 MG PO TABS
1.0000 mg | ORAL_TABLET | Freq: Two times a day (BID) | ORAL | Status: DC
  Administered 2015-03-03 – 2015-03-06 (×6): 1 mg via ORAL
  Filled 2015-03-03 (×7): qty 1

## 2015-03-03 MED ORDER — FOLIC ACID 400 MCG PO TABS
800.0000 ug | ORAL_TABLET | Freq: Every day | ORAL | Status: DC
  Filled 2015-03-03: qty 2

## 2015-03-03 MED ORDER — OMEPRAZOLE MAGNESIUM 20 MG PO TBEC: 20.0000 mg | DELAYED_RELEASE_TABLET | Freq: Every day | ORAL | Status: DC

## 2015-03-03 MED ORDER — CITALOPRAM HYDROBROMIDE 20 MG PO TABS
20.0000 mg | ORAL_TABLET | Freq: Every day | ORAL | Status: DC
  Administered 2015-03-04 – 2015-03-06 (×3): 20 mg via ORAL
  Filled 2015-03-03 (×3): qty 1

## 2015-03-03 MED ORDER — PANTOPRAZOLE SODIUM 40 MG PO TBEC
40.0000 mg | DELAYED_RELEASE_TABLET | Freq: Every day | ORAL | Status: DC
  Administered 2015-03-04 – 2015-03-06 (×3): 40 mg via ORAL
  Filled 2015-03-03 (×3): qty 1

## 2015-03-03 MED ORDER — OXYCODONE-ACETAMINOPHEN 5-325 MG PO TABS: 1.0000 | ORAL_TABLET | Freq: Four times a day (QID) | ORAL | Status: DC | PRN

## 2015-03-03 MED ORDER — ASPIRIN EC 81 MG PO TBEC
81.0000 mg | DELAYED_RELEASE_TABLET | Freq: Every day | ORAL | Status: DC
  Administered 2015-03-04 – 2015-03-06 (×3): 81 mg via ORAL
  Filled 2015-03-03 (×3): qty 1

## 2015-03-03 MED ORDER — VITAMIN D3 25 MCG (1000 UNIT) PO TABS
3000.0000 [IU] | ORAL_TABLET | Freq: Every day | ORAL | Status: DC
  Administered 2015-03-04 – 2015-03-06 (×3): 3000 [IU] via ORAL
  Filled 2015-03-03 (×6): qty 3

## 2015-03-03 MED ORDER — ACETAMINOPHEN 325 MG PO TABS: 650.0000 mg | ORAL_TABLET | Freq: Four times a day (QID) | ORAL | Status: DC | PRN

## 2015-03-03 MED ORDER — METOLAZONE 2.5 MG PO TABS
2.5000 mg | ORAL_TABLET | Freq: Every day | ORAL | Status: DC
  Administered 2015-03-04 – 2015-03-06 (×3): 2.5 mg via ORAL
  Filled 2015-03-03 (×3): qty 1

## 2015-03-03 MED ORDER — CLONAZEPAM 0.5 MG PO TABS
0.5000 mg | ORAL_TABLET | Freq: Two times a day (BID) | ORAL | Status: DC
  Administered 2015-03-03 – 2015-03-06 (×6): 0.5 mg via ORAL
  Filled 2015-03-03 (×6): qty 1

## 2015-03-03 MED ORDER — OXCARBAZEPINE 300 MG PO TABS
600.0000 mg | ORAL_TABLET | Freq: Two times a day (BID) | ORAL | Status: DC
  Administered 2015-03-03 – 2015-03-06 (×5): 600 mg via ORAL
  Filled 2015-03-03 (×6): qty 2

## 2015-03-03 MED ORDER — LEVOTHYROXINE SODIUM 200 MCG PO TABS
600.0000 ug | ORAL_TABLET | Freq: Every day | ORAL | Status: DC
  Administered 2015-03-04 – 2015-03-05 (×2): 600 ug via ORAL
  Filled 2015-03-03 (×3): qty 3

## 2015-03-03 MED ORDER — SENNA 8.6 MG PO TABS: 1.0000 | ORAL_TABLET | Freq: Every day | ORAL | Status: DC | PRN

## 2015-03-03 MED ORDER — CALCIUM ACETATE 667 MG PO CAPS
1334.0000 mg | ORAL_CAPSULE | Freq: Three times a day (TID) | ORAL | Status: DC
Start: 2015-03-04 — End: 2015-03-06
  Administered 2015-03-04 – 2015-03-06 (×5): 1334 mg via ORAL
  Filled 2015-03-03 (×14): qty 2

## 2015-03-03 MED ORDER — QUETIAPINE FUMARATE ER 50 MG PO TB24
300.0000 mg | ORAL_TABLET | Freq: Two times a day (BID) | ORAL | Status: DC
  Administered 2015-03-03 – 2015-03-06 (×5): 300 mg via ORAL
  Filled 2015-03-03: qty 6
  Filled 2015-03-03 (×6): qty 1

## 2015-03-03 MED ORDER — CINACALCET HCL 30 MG PO TABS
90.0000 mg | ORAL_TABLET | Freq: Every day | ORAL | Status: DC
  Administered 2015-03-04 – 2015-03-06 (×3): 90 mg via ORAL
  Filled 2015-03-03 (×3): qty 3

## 2015-03-03 MED ORDER — DOCUSATE SODIUM 100 MG PO CAPS: 100.0000 mg | ORAL_CAPSULE | Freq: Two times a day (BID) | ORAL | Status: DC | PRN

## 2015-03-03 MED ORDER — QUETIAPINE FUMARATE ER 50 MG PO TB24
50.0000 mg | ORAL_TABLET | Freq: Three times a day (TID) | ORAL | Status: DC | PRN
  Filled 2015-03-03: qty 1

## 2015-03-03 MED ORDER — HEPARIN SODIUM (PORCINE) 5000 UNIT/ML IJ SOLN
5000.0000 [IU] | Freq: Three times a day (TID) | INTRAMUSCULAR | Status: DC
  Administered 2015-03-03 – 2015-03-06 (×7): 5000 [IU] via SUBCUTANEOUS
  Filled 2015-03-03 (×7): qty 1

## 2015-03-03 MED ORDER — FERROUS FUMARATE 324 (106 FE) MG PO TABS
1.0000 | ORAL_TABLET | Freq: Every day | ORAL | Status: DC
  Administered 2015-03-06: 1 via ORAL
  Filled 2015-03-03 (×4): qty 1

## 2015-03-03 MED ORDER — RENA-VITE PO TABS
1.0000 | ORAL_TABLET | Freq: Every day | ORAL | Status: DC
  Administered 2015-03-04 – 2015-03-06 (×3): 1 via ORAL
  Filled 2015-03-03 (×3): qty 1

## 2015-03-03 MED ORDER — ACETAMINOPHEN 650 MG RE SUPP: 650.0000 mg | Freq: Four times a day (QID) | RECTAL | Status: DC | PRN

## 2015-03-03 NOTE — ED Notes (Signed)
states went to dialysis,unable to use fistula, recent revision of same fistula

## 2015-03-03 NOTE — ED Notes (Signed)
Pt sent from dialysis, right upper arm fistula occluded; unable to perform dialysis

## 2015-03-03 NOTE — H&P (Addendum)
[facility] [place] at [facility]   PATIENT NAME: [name] Wiley    MR#:  [id number]  DATE OF BIRTH:  12/12/1950  DATE OF ADMISSION:  03/03/2015  PRIMARY CARE PHYSICIAN: Dr. Terance HartBronstein  REQUESTING/REFERRING PHYSICIAN: Dr. Darnelle CatalanMalinda  CHIEF COMPLAINT:   Chief Complaint  Patient presents with  . Vascular Access Problem    dialy fistual noted clotted with atempted dialysis this am, pt from [facility], states fistula R arm    HISTORY OF PRESENT ILLNESS:  [name] Wiley  is a 64 y.o. male with a known history of end-stage renal disease on Tuesday Thursday Saturday hemodialysis, hypertension, bipolar disorder and seizure disorder, low intellectual capacity comes from [facility] nursing home for clotted AV graft. Patient was recently admitted 2 weeks ago for clotted AV graft at which time he had a temporary dialysis catheter placed and then the graft was declotted and he was discharged. He hasn't had any recent troubles. His dialysis on Thursday went well according to him. Patient's brother who is his power of attorney is at bedside and provides most of the history. Patient went for his dialysis today per schedule but they couldn't get it started because his graft seems like it is clotted again. No palpable thrill at this time. Patient denies any fevers chills cough congestion dyspnea or chest pain at this time.  PAST MEDICAL HISTORY:   Past Medical History  Diagnosis Date  . Dialysis patient   . Hypertension   . Intellectual disability   . Seizures   . GERD (gastroesophageal reflux disease)   . Renal disorder   . Hypothyroid   . CAD (coronary artery disease)   . Anxiety     PAST SURGICAL HISTORY:   Past Surgical History  Procedure Laterality Date  . Insert / replace / remove pacemaker    . Av fistula placement Right     SOCIAL HISTORY:   History  Substance Use Topics  . Smoking status: Never Smoker   . Smokeless tobacco: Never Used  . Alcohol  Use: No    FAMILY HISTORY:   Family History  Problem Relation Age of Onset  . Congestive Heart Failure Father     DRUG ALLERGIES:  No Known Allergies  REVIEW OF SYSTEMS:   ROS  CONSTITUTIONAL: No fever, fatigue or weakness.  EYES: No blurred or double vision.  EARS, NOSE, AND THROAT: No tinnitus or ear pain.  RESPIRATORY: No cough, shortness of breath, wheezing or hemoptysis.  CARDIOVASCULAR: No chest pain, orthopnea, edema.  GASTROINTESTINAL: No nausea, vomiting, diarrhea or abdominal pain.  GENITOURINARY: No dysuria, hematuria.  ENDOCRINE: No polyuria, nocturia,  HEMATOLOGY: No anemia, easy bruising or bleeding SKIN: No rash or lesion. MUSCULOSKELETAL: No joint pain or arthritis.  Myoclonic jerks present- chronic NEUROLOGIC: No tingling, numbness, weakness.  PSYCHIATRY: No anxiety or depression.   MEDICATIONS AT HOME:   Prior to Admission medications   Not on File      VITAL SIGNS:  Blood pressure 146/71, pulse 78, temperature 97.8 F (36.6 C), temperature source Oral, resp. rate 20, height 5\' 7"  (1.702 m), weight 83.915 kg (185 lb), SpO2 98 %.  PHYSICAL EXAMINATION:   Physical Exam  GENERAL:  64 y.o.-year-old patient lying in the bed with no acute distress.  EYES: Pupils equal, round, reactive to light and accommodation. No scleral icterus. Extraocular muscles intact.  HEENT: Head atraumatic, normocephalic. Oropharynx and nasopharynx clear.  NECK:  Supple, no jugular venous distention. No thyroid enlargement, no tenderness.  LUNGS: Normal breath sounds bilaterally, no wheezing, rales,rhonchi or crepitation. No use of accessory muscles of respiration.  CARDIOVASCULAR: S1, S2 normal. No murmurs, rubs, or gallops.  ABDOMEN: Soft, nontender, nondistended. Bowel sounds present. No organomegaly or mass.  EXTREMITIES: No pedal edema, cyanosis, or clubbing.  NEUROLOGIC: Cranial nerves II through XII are intact. Muscle strength 5/5 in all extremities. Sensation intact.  Gait not checked. Myoclonic jerks present PSYCHIATRIC: The patient is alert and oriented x 3. Low intellectual capacity SKIN: No obvious rash, lesion, or ulcer.   LABORATORY PANEL:   CBC  Recent Labs Lab 03/03/15 1310  WBC 3.5*  HGB 9.0*  HCT 28.0*  PLT 119*   ------------------------------------------------------------------------------------------------------------------  Chemistries   Recent Labs Lab 03/03/15 1310  NA 131*  K 5.2*  CL 98*  CO2 22  GLUCOSE 82  BUN 54*  CREATININE 7.47*  CALCIUM 7.7*  AST 16  ALT 7*  ALKPHOS 290*  BILITOT 0.5   ------------------------------------------------------------------------------------------------------------------  Cardiac Enzymes No results for input(s): TROPONINI in the last 168 hours. ------------------------------------------------------------------------------------------------------------------  RADIOLOGY:  No results found. CXR- pending EKG:   Orders placed or performed during the hospital encounter of 03/03/15  . ED EKG  . ED EKG  EKG- NSR, no peaked T waves  IMPRESSION AND PLAN:   [name] Wiley  is a 64 y.o. male with a known history of end-stage renal disease on Tuesday Thursday Saturday hemodialysis, hypertension, bipolar disorder and seizure disorder, low intellectual capacity comes from [facility] for clotted AV graft.  #1 thrombosed hemodialysis graft-vascular has been consulted. Patient's potassium is 5.2 but CXR with pulmonary vascular congestion. So patient will ned HD- discussed with Vascular and nephrology- plan to do a temp dialysis catheter either today or tomorrow and dialyze him.  Nephrology has been consulted as well. #2 end-stage renal disease on hemodialysis-on Tuesday Thursday Saturday hemodialysis. Last dialysis on Thursday. Nephrology has been consulted.  Patient will need urgent HD- temporary catheter will be placed and HD either today or tomorrow AM #3 bipolar  order-continue home medications. #4 hypertension-continue home medications #5 hypothyroidism-continue Synthroid #6 DVT prophylaxis-subcutaneous heparin   All the records are reviewed and case discussed with ED provider. Management plans discussed with the patient, family and they are in agreement.  CODE STATUS: Full code  TOTAL TIME TAKING CARE OF THIS PATIENT: 50 minutes.    Enid BaasKALISETTI,Tyrome Donatelli M.D on 03/03/2015 at 4:10 PM  Between 7am to 6pm - Pager - [phone]  After 6pm go to [email or web address] - password [email or web address]  [facility]  Office  [phone]  CC: Primary care physician; GENERAL MEDICAL CLINIC

## 2015-03-03 NOTE — ED Provider Notes (Signed)
[facility] Emergency Department Provider Note   ____________________________________________  Time seen: 1220  I have reviewed the triage vital signs and the nursing notes.   HISTORY  Chief Complaint Vascular Access Problem       HPI [name] JewettJames Wiley is a 64 y.o. male caregiver reports patient went to dialysis today and could not access the shunt because apparently it clotted this happen before patient reportedly got a groin catheter temporarily until they couldn't unclog his shunt in his arm during the week patient appears to be doing well otherwise no other complaints  Story is limited by his intellectual disability   Past Medical History  Diagnosis Date  . Dialysis patient   . Hypertension   . Intellectual disability   . Seizures   . GERD (gastroesophageal reflux disease)   . Renal disorder   . Hypothyroid   . CAD (coronary artery disease)   . Anxiety     There are no active problems to display for this patient.   No past surgical history on file.  No current outpatient prescriptions on file.  Allergies Review of patient's allergies indicates no known allergies.  No family history on file.  Social History History  Substance Use Topics  . Smoking status: Not on file  . Smokeless tobacco: Never Used  . Alcohol Use: No    Review of Systems Unable to obtain due to intellectual disability ____________________________________________   PHYSICAL EXAM:  VITAL SIGNS: ED Triage Vitals  Enc Vitals Group     BP 03/03/15 0923 142/74 mmHg     Pulse Rate 03/03/15 0923 76     Resp 03/03/15 0923 20     Temp 03/03/15 0923 97.8 F (36.6 C)     Temp Source 03/03/15 0923 Oral     SpO2 03/03/15 0923 98 %     Weight 03/03/15 0923 185 lb (83.915 kg)     Height 03/03/15 0923 5\' 7"  (1.702 m)     Head Cir --      [facility] --      Pain Score 03/03/15 0924 0     Pain Loc --      Pain Edu? --      Excl. in GC? --      Constitutional:  Alert  Well appearing and in no distress. Eyes: Conjunctivae are normal. PERRL. Normal extraocular movements. ENT   Head: Normocephalic and atraumatic.   Nose: No congestion/rhinnorhea.   Mouth/Throat: Mucous membranes are moist.   Neck: No stridor. Hematological/Lymphatic/Immunilogical: No cervical lymphadenopathy. Cardiovascular: Normal rate, regular rhythm. Normal and symmetric distal pulses are present in all extremities. No murmurs, rubs, or gallops. Respiratory: Normal respiratory effort without tachypnea nor retractions. Breath sounds are clear and equal bilaterally. No wheezes/rales/rhonchi. Gastrointestinal: Soft and nontender. No distention. No abdominal bruits. There is no CVA tenderness. Musculoskeletal: Nontender with normal range of motion in all extremities. No joint effusions.  No lower extremity tenderness or edema is present in his legs Skin:  Skin is warm, dry and intact. No rash noted.  ____________________________________________    LABS (pertinent positives/negatives)    ____________________________________________   EKG    ____________________________________________    RADIOLOGY  ____________________________________________   PROCEDURES  Procedure(s) performed: None  Critical Care performed: No  ____________________________________________   INITIAL IMPRESSION / ASSESSMENT AND PLAN / ED COURSE  Pertinent labs & imaging results that were available during my care of the patient were reviewed by me and considered in my medical decision making (see chart  for details).  Discussed patient with Dr. Eliseo SquiresSchneer he is currently prepping to go to the OR with a rupturing AAA we will check labs on this patient and determine if he needs acute dialysis now or can wait till later Dr. Eliseo SquiresSchneer will work on F is to do tPA for the shunt  ____________________________________________   FINAL CLINICAL IMPRESSION(S) / ED DIAGNOSES  Final diagnoses:   Clotted dialysis shunt, initial encounter    [name] NatalPaul F Malinda, MD 03/03/15 1547

## 2015-03-03 NOTE — Consult Note (Signed)
[facility] Vascular Consult Note  MRN : [id number]  [name] Wiley is a 64 y.o. (04/11/1951) male who presents with chief complaint of  Chief Complaint  Patient presents with  . Vascular Access Problem    dialy fistual noted clotted with atempted dialysis this am, pt from [facility], states fistula R arm  .  History of Present Illness: The patient presented to dialysis earlier this a.m. and was found to have a thrombosed right arm brachial axillary dialysis graft. He recently underwent thrombectomy of this same graft.  He denies arm pain. There is no redness over the access. There been no problems with cannulation over the past week. There is no report of prolonged bleeding.  He denies fever or chills.  No current facility-administered medications for this encounter.    Past Medical History  Diagnosis Date  . Dialysis patient   . Hypertension   . Intellectual disability   . Seizures   . GERD (gastroesophageal reflux disease)   . Renal disorder   . Hypothyroid   . CAD (coronary artery disease)   . Anxiety     Past Surgical History  Procedure Laterality Date  . Insert / replace / remove pacemaker    . Av fistula placement Right     Social History History  Substance Use Topics  . Smoking status: Never Smoker   . Smokeless tobacco: Never Used  . Alcohol Use: No    Family History Family History  Problem Relation Age of Onset  . Congestive Heart Failure Father    no family history of porphyria or autoimmune disease  No Known Allergies   REVIEW OF SYSTEMS (Negative unless checked)  Constitutional: [] Weight loss  [] Fever  [] Chills Cardiac: [] Chest pain   [] Chest pressure   [] Palpitations   [] Shortness of breath when laying flat   [] Shortness of breath at rest   [] Shortness of breath with exertion. Vascular:  [] Pain in legs with walking   [] Pain in legs at rest   [] Pain in legs when laying flat   [] Claudication   [] Pain in feet when  walking  [] Pain in feet at rest  [] Pain in feet when laying flat   [] History of DVT   [] Phlebitis   [] Swelling in legs   [] Varicose veins   [] Non-healing ulcers Pulmonary:   [] Uses home oxygen   [] Productive cough   [] Hemoptysis   [] Wheeze  [] COPD   [] Asthma Neurologic:  [] Dizziness  [] Blackouts   [] Seizures   [] History of stroke   [] History of TIA  [] Aphasia   [] Temporary blindness   [] Dysphagia   [] Weakness or numbness in arms   [] Weakness or numbness in legs Musculoskeletal:  [] Arthritis   [] Joint swelling   [] Joint pain   [] Low back pain Hematologic:  [] Easy bruising  [] Easy bleeding   [] Hypercoagulable state   [] Anemic  [] Hepatitis Gastrointestinal:  [] Blood in stool   [] Vomiting blood  [] Gastroesophageal reflux/heartburn   [] Difficulty swallowing. Genitourinary:  [] Chronic kidney disease   [] Difficult urination  [] Frequent urination  [] Burning with urination   [] Blood in urine Skin:  [] Rashes   [] Ulcers   [] Wounds Psychological:  [] History of anxiety   []  History of major depression.   Physical Examination  Filed Vitals:   03/03/15 0923 03/03/15 1542 03/03/15 1732  BP: 142/74 146/71 159/76  Pulse: 76 78 74  Temp: 97.8 F (36.6 C)  98 F (36.7 C)  TempSrc: Oral  Oral  Resp: 20  18  Height: 5\' 7"  (1.702 m)  Weight: 185 lb (83.915 kg)    SpO2: 98% 98% 100%   Body mass index is 28.97 kg/(m^2).  Head: [place]/AT, No temporalis wasting. Prominent temp pulse not noted. Ear/Nose/Throat: Hearing grossly intact, nares w/o erythema or drainage, oropharynx w/o Erythema/Exudate, Mallampati score: Oropharynx clear no masses.  Dentition poor.  Eyes: PERRLA, EOMI.  Neck: Supple, no nuchal rigidity.  No bruit or JVD.  Pulmonary:  Good air movement, clear to auscultation bilaterally.  Cardiac: RRR, normal S1, S2, no Murmurs, rubs or gallops. Vascular: Right arm brachial axillary dialysis graft thrill and no bruit, appropriate scarring from chronic use. Left upper extremity demonstrates multiple  scars from prior access'. Vascular:     Ulnar Palpable Palpable  Brachial Palpable Palpable  Carotid Palpable, without bruit Palpable, without bruit  Aorta Not palpable N/A  Femoral Palpable Palpable  Popliteal Palpable Palpable  PT Palpable Palpable  DP Palpable Palpable  Gastrointestinal: soft, non-tender/non-distended. No guarding/reflex. No masses, surgical incisions, or scars. Musculoskeletal: M/S 5/5 throughout.  Extremities without ischemic changes.  No deformity or atrophy. No edema. Neurologic: CN 2-12 intact. Pain and light touch intact in extremities.  Symmetrical.  Speech is fluent. Motor exam as listed above. Psychiatric: Judgment intact, Mood & affect appropriate for pt's clinical situation. Dermatologic: No rashes or ulcers noted.  No cellulitis or open wounds. Lymph : No Cervical, Axillary, or Inguinal lymphadenopathy.   CBC Lab Results  Component Value Date   WBC 3.5* 03/03/2015   HGB 9.0* 03/03/2015   HCT 28.0* 03/03/2015   MCV 98.9 03/03/2015   PLT 119* 03/03/2015    BMET    Component Value Date/Time   NA 131* 03/03/2015 1310   NA 133* 02/17/2015 0630   K 5.2* 03/03/2015 1310   K 5.6* 02/19/2015 0447   CL 98* 03/03/2015 1310   CL 98* 02/17/2015 0630   CO2 22 03/03/2015 1310   CO2 19* 02/17/2015 0630   GLUCOSE 82 03/03/2015 1310   GLUCOSE 69 02/17/2015 0630   BUN 54* 03/03/2015 1310   BUN 90* 02/17/2015 0630   CREATININE 7.47* 03/03/2015 1310   CREATININE 10.06* 02/17/2015 0630   CALCIUM 7.7* 03/03/2015 1310   CALCIUM 8.1* 02/17/2015 0630   GFRNONAA 7* 03/03/2015 1310   GFRNONAA 5* 02/17/2015 0630   GFRAA 8* 03/03/2015 1310   GFRAA 6* 02/17/2015 0630   Estimated Creatinine Clearance: 10.2 mL/min (by C-G formula based on Cr of 7.47).  COAG Lab Results  Component Value Date   INR 1.7 01/14/2012   INR 2.6 01/08/2012   INR 2.4 01/07/2012    Assessment/Plan #1 thrombosed hemodialysis graft: Patient's potassium is 5.2 and his CXR shows  pulmonary vascular congestion. The patient will need dialysis, I will plan to do a temp dialysis catheter and he will dialyze. Pending the results of the dialysis may be able to go home on Monday and will be scheduled for thrombectomy of his AV graft on Tuesday Nephrology has been consulted  #2 end-stage renal disease on hemodialysis-on Tuesday Thursday Saturday hemodialysis. Last dialysis on Thursday. Nephrology has been consulted.  #3 bipolar order-continue home medications. #4 hypertension-continue home medications #5 hypothyroidism-continue Synthroid   Level III consult    Bedford Winsor, Latina CraverGregory G, MD  03/03/2015 6:42 PM

## 2015-03-04 LAB — BASIC METABOLIC PANEL
Anion gap: 10 (ref 5–15)
BUN: 65 mg/dL — AB (ref 6–20)
CO2: 25 mmol/L (ref 22–32)
CREATININE: 8.56 mg/dL — AB (ref 0.61–1.24)
Calcium: 8.2 mg/dL — ABNORMAL LOW (ref 8.9–10.3)
Chloride: 100 mmol/L — ABNORMAL LOW (ref 101–111)
GFR, EST AFRICAN AMERICAN: 7 mL/min — AB (ref >60)
GFR, EST NON AFRICAN AMERICAN: 6 mL/min — AB (ref >60)
Glucose, Bld: 70 mg/dL (ref 65–99)
Potassium: 5.2 mmol/L — ABNORMAL HIGH (ref 3.5–5.1)
Sodium: 135 mmol/L (ref 135–145)

## 2015-03-04 LAB — CBC
HEMATOCRIT: 27.5 % — AB (ref 40.0–52.0)
Hemoglobin: 8.9 g/dL — ABNORMAL LOW (ref 13.0–18.0)
MCH: 31.9 pg (ref 26.0–34.0)
MCHC: 32.3 g/dL (ref 32.0–36.0)
MCV: 98.8 fL (ref 80.0–100.0)
PLATELETS: 131 10*3/uL — AB (ref 150–440)
RBC: 2.79 MIL/uL — ABNORMAL LOW (ref 4.40–5.90)
RDW: 18.1 % — AB (ref 11.5–14.5)
WBC: 3.1 10*3/uL — ABNORMAL LOW (ref 3.8–10.6)

## 2015-03-04 MED ORDER — LORAZEPAM 2 MG/ML IJ SOLN
INTRAMUSCULAR | Status: AC
  Administered 2015-03-04: 12:00:00
  Filled 2015-03-04: qty 1

## 2015-03-04 MED ORDER — FOLIC ACID 1 MG PO TABS
1.0000 mg | ORAL_TABLET | Freq: Every day | ORAL | Status: DC
  Administered 2015-03-04 – 2015-03-06 (×3): 1 mg via ORAL
  Filled 2015-03-04 (×3): qty 1

## 2015-03-04 MED ORDER — SODIUM CHLORIDE 0.9 % IV SOLN: 100.0000 mL | INTRAVENOUS | Status: DC | PRN

## 2015-03-04 MED ORDER — EPOETIN ALFA 10000 UNIT/ML IJ SOLN
10000.0000 [IU] | Freq: Once | INTRAMUSCULAR | Status: AC
  Administered 2015-03-04: 10000 [IU] via INTRAVENOUS
  Filled 2015-03-04: qty 1

## 2015-03-04 NOTE — Evaluation (Signed)
Physical Therapy Evaluation Patient Details Name: [name] Wiley MRN: [id number] DOB: 08/19/1951 Today's Date: 03/04/2015   History of Present Illness  [name] JewettJames Kosar is a 64 y.o. male with a known history of end-stage renal disease on Tuesday Thursday Saturday hemodialysis, hypertension, bipolar disorder and seizure disorder, low intellectual capacity comes from [facility] for clotted AV graft. Patient was recently admitted 2 weeks ago for clotted AV graft at which time he had a temporary dialysis catheter placed and then the graft was declotted and he was discharged. He hasn't had any recent troubles. His dialysis on Thursday went well according to him. Patient's brother who is his power of attorney is at bedside and provides most of the history. Patient went for his dialysis today per schedule but they couldn't get it started because his graft seems like it is clotted again. No palpable thrill at this time. Patient denies any fevers chills cough congestion dyspnea or chest pain at this time.  Clinical Impression  Pleasant and polite resident of assisted facility who uses a front-wheeled walker for ambulation at baseline. Patient is independent in bed mobility but demonstrates poor standing balance and decreased safety awareness which impairs his OOB mobility. Patient would benefit from skilled PT services to improve his safety and mobility in transfers and ambulation and to decrease his risk of falling.    Follow Up Recommendations      Equipment Recommendations       Recommendations for Other Services       Precautions / Restrictions Precautions Precautions: Fall Restrictions Weight Bearing Restrictions: No      Mobility  Bed Mobility Overal bed mobility: Independent                Transfers Overall transfer level: Modified independent Equipment used: Rolling walker (2 wheeled)             General transfer comment: Somewhat  impulsive  Ambulation/Gait Ambulation/Gait assistance: Supervision Ambulation Distance (Feet): 175 Feet Assistive device: Rolling walker (2 wheeled) Gait Pattern/deviations: Decreased step length - right;Decreased step length - left (Holds walker far in front despite cueing.) Gait velocity: 1.5 Gait velocity interpretation: <1.8 ft/sec, indicative of risk for recurrent falls General Gait Details: Dan HumphreysWalker is short to accommodate somewhat flexed trunk. Attempted gait with taller walker, but patient prefers his own walker, which will not adjust higher.. Pt appears to be ambulating in his habitual pattern.  Stairs Stairs:  (not tested)          Wheelchair Mobility    Modified Rankin (Stroke Patients Only)       Balance Overall balance assessment: Needs assistance Sitting-balance support:  (Good)           Standing balance comment: Unable to attempt SLS or tandem stance on either leg.         Rhomberg - Eyes Opened: 1                   Pertinent Vitals/Pain Pain Assessment: No/denies pain    Home Living Family/patient expects to be discharged to::  (previous living facility)                      Prior Function Level of Independence: Independent with assistive device(s)         Comments: Uses front-wheeled walker for mobility     Hand Dominance        Extremity/Trunk Assessment   Upper Extremity Assessment: Overall WFL for tasks assessed  Lower Extremity Assessment: Overall WFL for tasks assessed      Cervical / Trunk Assessment:  (Flexed trunk in standing)  Communication   Communication:  (Talkative but sometimes difficulty to understand)  Cognition   Behavior During Therapy: WFL for tasks assessed/performed Overall Cognitive Status: History of cognitive impairments - at baseline                      General Comments      Exercises        Assessment/Plan    PT Assessment Patient needs continued PT  services  PT Diagnosis Difficulty walking (Impaired balance with increased fall risk)   PT Problem List Decreased balance;Decreased safety awareness;Decreased mobility  PT Treatment Interventions Gait training;Therapeutic activities;Therapeutic exercise;Balance training;Neuromuscular re-education   PT Goals (Current goals can be found in the Care Plan section) Acute Rehab PT Goals Patient Stated Goal: Patient unable to articulate goal but very agreeable to walking. PT Goal Formulation: Patient unable to participate in goal setting Potential to Achieve Goals: Good    Frequency Min 2X/week   Barriers to discharge        Co-evaluation               End of Session Equipment Utilized During Treatment: Gait belt Activity Tolerance: Patient tolerated treatment well Patient left: in bed;with call bell/phone within reach;with SCD's reapplied           Time: 1000-1033 PT Time Calculation (min) (ACUTE ONLY): 33 min   Charges:   PT Evaluation $Initial PT Evaluation Tier I: 1 Procedure PT Treatments $Gait Training: 8-22 mins   PT G Codes:       Buzzy Hanharity Rayaan Lorah, PT, GCS  Iver Fehrenbach A Germain OsgoodJohansson 03/04/2015, 10:58 AM

## 2015-03-04 NOTE — Op Note (Signed)
  OPERATIVE NOTE   PROCEDURE: 1. Insertion of temporary dialysis catheter catheter right femoral approach.  PRE-OPERATIVE DIAGNOSIS: End-stage renal disease requiring hemodialysis, consultation dialysis device with thrombosis of AV access, hyperkalemia, congestive heart failure  POST-OPERATIVE DIAGNOSIS: Same  SURGEON: [name] Wiley M.D.  ANESTHESIA: 1% lidocaine local infiltration  ESTIMATED BLOOD LOSS: Minimal cc  INDICATIONS:   [name] JewettJames Wiley is a 64 y.o. male who presents with thrombosis of his AV graft. Evaluation subsequently identified hyperkalemia as well as volume overload. He is therefore undergoing initiation of dialysis via a temporary catheter and will undergo thrombectomy of his AV graft when more stable.  DESCRIPTION: After obtaining full informed written consent, the patient was positioned supine. The right groin was prepped and draped in a sterile fashion. Ultrasound was placed in a sterile sleeve. Ultrasound was utilized to identify the right femoral vein which is noted to be echolucent and compressible indicating patency. Images recorded for the permanent record. Under real-time visualization a Seldinger needle is inserted into the vein and the guidewires advanced without difficulty. Small counterincision was made at the wire insertion site. Dilator is passed over the wire and the temporary dialysis catheter catheter is fed over the wire without difficulty.  All lumens aspirate and flush easily and are packed with heparin saline. Catheter secured to the skin of the thigh with 2-0 silk. A sterile dressing is applied with Biopatch.  COMPLICATIONS: None  CONDITION: Good  [name] DillsGregory G Darrien Laakso, M.D. [facility]. Office:  [phone]

## 2015-03-04 NOTE — Progress Notes (Signed)
This note also relates to the following rows which could not be included: Resp - Cannot attach notes to unvalidated device data   HD tx completed 

## 2015-03-04 NOTE — Progress Notes (Signed)
HD tx start 

## 2015-03-04 NOTE — Progress Notes (Signed)
[facility]  ROUNDING NOTE   Subjective:   Admitted for hyperkalemia, potassium of 5.2 and clotted access. Patient unable to get dialysis on Saturday. Last treatment was Thursday.  Declot scheduled for Tuesday, 5/10  Objective:  Vital signs in last 24 hours:  Temp:  [97.3 F (36.3 C)-98.3 F (36.8 C)] 97.4 F (36.3 C) (05/08 0817) Pulse Rate:  [69-78] 69 (05/08 0817) Resp:  [14-18] 17 (05/08 0817) BP: (130-159)/(56-78) 155/78 mmHg (05/08 0817) SpO2:  [98 %-100 %] 98 % (05/08 0817)  Weight change:  Filed Weights   03/03/15 0923  Weight: 83.915 kg (185 lb)    Intake/Output: I/O last 3 completed shifts: In: 240 [P.O.:240] Out: 100 [Urine:100]   Intake/Output this shift:     Physical Exam: General: NAD,   Head: Normocephalic, atraumatic. Moist oral mucosal membranes  Eyes: Anicteric, PERRL  Neck: Supple, trachea midline  Lungs:  Clear to auscultation  Heart: Regular rate and rhythm  Abdomen:  Soft, nontender,   Extremities: trace peripheral edema.  Neurologic: Nonfocal, moving all four extremities  Skin: No lesions  Access: Right forearm AVG    Basic Metabolic Panel:  Recent Labs Lab 03/03/15 1310 03/04/15 0557  NA 131* 135  K 5.2* 5.2*  CL 98* 100*  CO2 22 25  GLUCOSE 82 70  BUN 54* 65*  CREATININE 7.47* 8.56*  CALCIUM 7.7* 8.2*    Liver Function Tests:  Recent Labs Lab 03/03/15 1310  AST 16  ALT 7*  ALKPHOS 290*  BILITOT 0.5  PROT 6.1*  ALBUMIN 3.3*   No results for input(s): LIPASE, AMYLASE in the last 168 hours. No results for input(s): AMMONIA in the last 168 hours.  CBC:  Recent Labs Lab 03/03/15 1310 03/04/15 0557  WBC 3.5* 3.1*  NEUTROABS 1.8  --   HGB 9.0* 8.9*  HCT 28.0* 27.5*  MCV 98.9 98.8  PLT 119* 131*    Cardiac Enzymes: No results for input(s): CKTOTAL, CKMB, CKMBINDEX, TROPONINI in the last 168 hours.  BNP: Invalid input(s): POCBNP  CBG: No results for input(s): GLUCAP in the last 168  hours.  Microbiology: Results for orders placed or performed in visit on 02/20/14  Urine culture     Status: None   Collection Time: 02/20/14 10:15 PM  Result Value Ref Range Status   Micro Text Report   Final       SOURCE: SOURCE NOT INDICATED    COMMENT                   MIXED BACTERIAL ORGANISMS   COMMENT                   RESULTS SUGGESTIVE OF CONTAMINATION   ANTIBIOTIC                                                        Coagulation Studies: No results for input(s): LABPROT, INR in the last 72 hours.  Urinalysis: No results for input(s): COLORURINE, LABSPEC, PHURINE, GLUCOSEU, HGBUR, BILIRUBINUR, KETONESUR, PROTEINUR, UROBILINOGEN, NITRITE, LEUKOCYTESUR in the last 72 hours.  Invalid input(s): APPERANCEUR    Imaging: Dg Chest Portable 1 View  03/03/2015   CLINICAL DATA:  Vascular access problems.  EXAM: PORTABLE CHEST - 1 VIEW  COMPARISON:  02/16/2015.  FINDINGS: Dual lead LEFT subclavian pacemaker. Cardiomegaly para  pulmonary vascular congestion. No airspace consolidation. No pleural effusion. Extensive vascular stenting in the RIGHT upper extremity knee vessels. LEFT-greater-than-RIGHT glenohumeral osteoarthritis. The bones are diffusely dense compatible with renal osteodystrophy.  IMPRESSION: Cardiomegaly and pulmonary vascular congestion.   Electronically Signed   By: Andreas NewportGeoffrey  Lamke M.D.   On: 03/03/2015 16:59     Medications:     . aspirin EC  81 mg Oral Daily  . benztropine  1 mg Oral BID  . calcium acetate  1,334 mg Oral TID WC  . cholecalciferol  3,000 Units Oral Daily  . cinacalcet  90 mg Oral Daily  . citalopram  20 mg Oral Daily  . clonazePAM  0.5 mg Oral BID  . Ferrous Fumarate  1 tablet Oral Daily  . folic acid  1 mg Oral Daily  . [START ON 03/06/2015] haloperidol  5 mg Oral Q T,Th,Sat-1800  . heparin  5,000 Units Subcutaneous 3 times per day  . levothyroxine  600 mcg Oral QAC breakfast  . LORazepam      . metolazone  2.5 mg Oral Daily  .  multivitamin  1 tablet Oral Daily  . omega-3 acid ethyl esters  1 g Oral BID  . oxcarbazepine  600 mg Oral BID  . pantoprazole  40 mg Oral Daily  . phenytoin  300 mg Oral QHS  . QUEtiapine  300 mg Oral BID   acetaminophen **OR** acetaminophen, albuterol, docusate sodium, oxyCODONE-acetaminophen, QUEtiapine, senna  Assessment/ Plan:  64 y.o. black male with Bipolar Didorder, hypertension, Mental retardation, Anemia, SHPTH  Admitted on 03/03/2015 for clotted dialysis access and hyperkalemia  [facility]   1. End Stage Renal Disease: with hyperkalemia. Missed dialysis yesterday. Last dialysis Thursday.  - Plan on dialysis today after temp HD catheter placed. Discussed case with Dr. Gilda CreaseSchnier.  - Orders prepared.   2. Hypertension: well controlled. Currently on metolazone as outpatient.   3. Anemia of chronic kidney disease: hemoglobin of 8.9. With mild thrombocytopenia. Outpatient baseline of 134.  - epo with HD treatment.   4. Secondary Hyperparathyroidism: PTH 171, phos 5 as outpatient.  sensipar ordered.  - Calcium acetate with meals.    LOS: 1 Jacey Pelc 5/8/20161:18 PM

## 2015-03-04 NOTE — Progress Notes (Signed)
Pre-hd tx 

## 2015-03-04 NOTE — Progress Notes (Signed)
Post hd tx 

## 2015-03-04 NOTE — Progress Notes (Signed)
[facility] -  at [facility]   PATIENT NAME: [name] JewettJames Wiley    MR#:  [id number]  DATE OF BIRTH:  04/11/1951  SUBJECTIVE:  CHIEF COMPLAINT:   Chief Complaint  Patient presents with  . Vascular Access Problem    dialy fistual noted clotted with atempted dialysis this am, pt from [facility], states fistula R arm    REVIEW OF SYSTEMS:    Review of Systems  Constitutional: Negative for fever and chills.  HENT: Negative for tinnitus.   Respiratory: Negative for cough and shortness of breath.   Cardiovascular: Negative for chest pain, orthopnea and PND.  Gastrointestinal: Negative for nausea, vomiting, abdominal pain and diarrhea.  Genitourinary: Negative for dysuria and hematuria.  All other systems reviewed and are negative.   Nutrition: Renal Diet.  Tolerating Diet: Renal Diet Tolerating PT:  No     DRUG ALLERGIES:  No Known Allergies  VITALS:  Blood pressure 155/78, pulse 69, temperature 97.4 F (36.3 C), temperature source Oral, resp. rate 17, height 5\' 7"  (1.702 m), weight 83.915 kg (185 lb), SpO2 98 %.  PHYSICAL EXAMINATION:   Physical Exam  GENERAL:  64 y.o.-year-old patient lying in the bed with no acute distress.  EYES: Pupils equal, round, reactive to light and accommodation. No scleral icterus. Extraocular muscles intact.  HEENT: Head atraumatic, normocephalic. Oropharynx and nasopharynx clear.  NECK:  Supple, no jugular venous distention. No thyroid enlargement, no tenderness.  LUNGS: Normal breath sounds bilaterally, no wheezing, rales, rhonchi. No use of accessory muscles of respiration.  CARDIOVASCULAR: S1, S2 normal. No murmurs, rubs, or gallops.  ABDOMEN: Soft, nontender, nondistended. Bowel sounds present. No organomegaly or mass.  EXTREMITIES: No cyanosis, clubbing or edema b/l.    NEUROLOGIC: Cranial nerves II through XII are intact. No focal Motor or sensory deficits b/l.   PSYCHIATRIC: Anxious. Flate SKIN: No  obvious rash, lesion, or ulcer.    LABORATORY PANEL:   CBC  Recent Labs Lab 03/04/15 0557  WBC 3.1*  HGB 8.9*  HCT 27.5*  PLT 131*   ------------------------------------------------------------------------------------------------------------------  Chemistries   Recent Labs Lab 03/03/15 1310 03/04/15 0557  NA 131* 135  K 5.2* 5.2*  CL 98* 100*  CO2 22 25  GLUCOSE 82 70  BUN 54* 65*  CREATININE 7.47* 8.56*  CALCIUM 7.7* 8.2*  AST 16  --   ALT 7*  --   ALKPHOS 290*  --   BILITOT 0.5  --    ------------------------------------------------------------------------------------------------------------------  Cardiac Enzymes No results for input(s): TROPONINI in the last 168 hours. ------------------------------------------------------------------------------------------------------------------  RADIOLOGY:  Dg Chest Portable 1 View  03/03/2015   CLINICAL DATA:  Vascular access problems.  EXAM: PORTABLE CHEST - 1 VIEW  COMPARISON:  02/16/2015.  FINDINGS: Dual lead LEFT subclavian pacemaker. Cardiomegaly para pulmonary vascular congestion. No airspace consolidation. No pleural effusion. Extensive vascular stenting in the RIGHT upper extremity knee vessels. LEFT-greater-than-RIGHT glenohumeral osteoarthritis. The bones are diffusely dense compatible with renal osteodystrophy.  IMPRESSION: Cardiomegaly and pulmonary vascular congestion.   Electronically Signed   By: Andreas NewportGeoffrey  Lamke M.D.   On: 03/03/2015 16:59     ASSESSMENT AND PLAN:   [name] Wiley is a 64 y.o. male with a known history of end-stage renal disease on Tuesday Thursday Saturday hemodialysis, hypertension, bipolar disorder and seizure disorder, low intellectual capacity comes from [facility] nursing home for clotted AV graft.  * thrombosed hemodialysis graft - plan for declot on Tuesday next week.  - pt. To go for temp  cath today.  Pt. Has a mildly elevated potassium and to go for HD today after temp. Cath  done.   * Hyperkalemia - likely due to ESRD and pt. Not having dialysis since Saturday.  - pt. To go for HD later today. Follow potassium post-HD  * end-stage renal disease on hemodialysis - on Tuesday Thursday Saturday hemodialysis.  - Nephrology has been consulted.  Going for temp. Cath today to have HD after that.  - potassium mildly elevated.   * bipolar order - cont. Trileptal, Seroquel.   * Seizures disorder - no acute seizures.  - cont. Dilantin.   * hypothyroidism - continue Synthroid  * Secondary Hyperparathyroidism - cont. Sensipar.    All the records are reviewed and case discussed with Care Management/Social Workerr. Management plans discussed with the patient, family and they are in agreement.  CODE STATUS: Full  DVT Prophylaxis: Ambulatory, Heparin SQ  TOTAL TIME TAKING CARE OF THIS PATIENT: 25 minutes.   POSSIBLE D/C IN 1-2 DAYS, DEPENDING ON CLINICAL CONDITION.   Houston SirenSAINANI,[name] Wiley J M.D on 03/04/2015 at 10:09 AM  Between 7am to 6pm - Pager - [phone]  After 6pm go to [email or web address] - password [email or web address] [facility]  [facility]  Office  [phone]  CC: Primary care physician; [facility]

## 2015-03-04 NOTE — Progress Notes (Signed)
S: Patient denies pain he denies shortness of breath.  O:  Temp (F)    97.3-98.3 97.4  97.4 (36.3)  05/08 0817   Pulse Rate    71-78 69  69  05/08 0817   Resp    14-20 17  17   05/08 0817   BP    130/56-159/76 155/78   155/78  05/08 0817   SpO2 (%)    98-100 98  98  05/08 0817      PE:  Respirations unlabored; clear to auscultation         Right groin clean dry and intact          Lower extremities warm and well perfused 1+ edema  A:  Consultation of dialysis device with thrombosis of right arm AV graft       End stage renal disease requiring hemodialysis        Mild CHF by x-ray        Hyperkalemia  P:  I will move forward with placement of a temporary catheter. Dialysis will be performed today and should his parameters be improved from a vascular surgery standpoint discharge would be okay and he could follow-up as an outpatient on Tuesday for thrombectomy.

## 2015-03-04 NOTE — Progress Notes (Signed)
CSW consult as pt admitted from facility. Pt sleeping at time of visit and no answer on listed brother's phone. Pt admitted from [facility]. FL2 complete and on chart. Full eval to follow. Dellie BurnsJosie Tommie Dejoseph, MSW, LCSW [phone] (weekend coverage)

## 2015-03-05 LAB — CBC
HCT: 26.4 % — ABNORMAL LOW (ref 40.0–52.0)
HEMOGLOBIN: 8.4 g/dL — AB (ref 13.0–18.0)
MCH: 31.6 pg (ref 26.0–34.0)
MCHC: 31.8 g/dL — ABNORMAL LOW (ref 32.0–36.0)
MCV: 99.5 fL (ref 80.0–100.0)
Platelets: 121 10*3/uL — ABNORMAL LOW (ref 150–440)
RBC: 2.66 MIL/uL — AB (ref 4.40–5.90)
RDW: 18 % — ABNORMAL HIGH (ref 11.5–14.5)
WBC: 2.9 10*3/uL — ABNORMAL LOW (ref 3.8–10.6)

## 2015-03-05 LAB — RENAL FUNCTION PANEL
Albumin: 2.8 g/dL — ABNORMAL LOW (ref 3.5–5.0)
Anion gap: 9 (ref 5–15)
BUN: 40 mg/dL — ABNORMAL HIGH (ref 6–20)
CALCIUM: 7.4 mg/dL — AB (ref 8.9–10.3)
CO2: 31 mmol/L (ref 22–32)
Chloride: 98 mmol/L — ABNORMAL LOW (ref 101–111)
Creatinine, Ser: 6.1 mg/dL — ABNORMAL HIGH (ref 0.61–1.24)
GFR calc Af Amer: 10 mL/min — ABNORMAL LOW (ref >60)
GFR, EST NON AFRICAN AMERICAN: 9 mL/min — AB (ref >60)
GLUCOSE: 98 mg/dL (ref 65–99)
Phosphorus: 3.5 mg/dL (ref 2.5–4.6)
Potassium: 4.6 mmol/L (ref 3.5–5.1)
SODIUM: 138 mmol/L (ref 135–145)

## 2015-03-05 LAB — TSH: TSH: 0.018 u[IU]/mL — AB (ref 0.350–4.500)

## 2015-03-05 MED ORDER — LIDOCAINE HCL (PF) 1 % IJ SOLN
5.0000 mL | INTRAMUSCULAR | Status: DC | PRN
  Filled 2015-03-05: qty 5

## 2015-03-05 MED ORDER — LIDOCAINE-PRILOCAINE 2.5-2.5 % EX CREA: 1.0000 "application " | TOPICAL_CREAM | CUTANEOUS | Status: DC | PRN

## 2015-03-05 MED ORDER — ALTEPLASE 2 MG IJ SOLR
2.0000 mg | Freq: Once | INTRAMUSCULAR | Status: AC | PRN
  Filled 2015-03-05: qty 2

## 2015-03-05 MED ORDER — SODIUM CHLORIDE 0.9 % IV SOLN: 100.0000 mL | INTRAVENOUS | Status: DC | PRN

## 2015-03-05 MED ORDER — LIDOCAINE-PRILOCAINE 2.5-2.5 % EX CREA
1.0000 "application " | TOPICAL_CREAM | CUTANEOUS | Status: DC | PRN
  Filled 2015-03-05: qty 5

## 2015-03-05 MED ORDER — NEPRO/CARBSTEADY PO LIQD: 237.0000 mL | ORAL | Status: DC | PRN

## 2015-03-05 MED ORDER — NEPRO/CARBSTEADY PO LIQD
237.0000 mL | Freq: Every morning | ORAL | Status: DC
  Administered 2015-03-06: 237 mL via ORAL

## 2015-03-05 MED ORDER — PENTAFLUOROPROP-TETRAFLUOROETH EX AERO
1.0000 "application " | INHALATION_SPRAY | CUTANEOUS | Status: DC | PRN
  Filled 2015-03-05: qty 30

## 2015-03-05 MED ORDER — HEPARIN SODIUM (PORCINE) 1000 UNIT/ML DIALYSIS
1000.0000 [IU] | INTRAMUSCULAR | Status: DC | PRN
  Filled 2015-03-05: qty 1

## 2015-03-05 MED ORDER — HEPARIN SODIUM (PORCINE) 1000 UNIT/ML DIALYSIS
1000.0000 [IU] | INTRAMUSCULAR | Status: DC | PRN
  Administered 2015-03-05: 1000 [IU] via INTRAVENOUS_CENTRAL
  Filled 2015-03-05 (×2): qty 1

## 2015-03-05 MED ORDER — CEFAZOLIN SODIUM 1-5 GM-% IV SOLN
1.0000 g | INTRAVENOUS | Status: AC
  Administered 2015-03-06: 1 g via INTRAVENOUS
  Filled 2015-03-05: qty 50

## 2015-03-05 MED ORDER — ETHYL CHLORIDE EX AERO
INHALATION_SPRAY | CUTANEOUS | Status: DC | PRN
  Filled 2015-03-05 (×2): qty 103.5

## 2015-03-05 MED ORDER — ALTEPLASE 2 MG IJ SOLR
2.0000 mg | Freq: Once | INTRAMUSCULAR | Status: DC | PRN
  Filled 2015-03-05: qty 2

## 2015-03-05 NOTE — Progress Notes (Signed)
HD END 

## 2015-03-05 NOTE — Care Management Note (Signed)
Case Management Note  Patient Details  Name: [name] JewettJames Wiley MRN: [id number] Date of Birth: 09/12/1951  Subjective/Objective:                   Spoke with MD Dr Vanita PandaSanaini concerning patient discharge planning. Plan is for fistula declot tomorrow by Dr Lorretta HarpSchneir. [facility] holding bed per CSW. Patient has temp femoral cath now.  Action/Plan:  Return to [facility]. Expected Discharge Date:                  Expected Discharge Plan:  Skilled Nursing Facility  In-House Referral:  Clinical Social Work  Discharge planning Services     Post Acute Care Choice:    Choice offered to:     DME Arranged:    DME Agency:     HH Arranged:    HH Agency:     Status of Service:     Medicare Important Message Given:  Yes Date Medicare IM Given:    Medicare IM give by:    Date Additional Medicare IM Given:  03/05/15 Additional Medicare Important Message give by:   Serena Colonelick Shantele Reller NCM  If discussed at Long Length of Stay Meetings, dates discussed:    Additional Comments:  [name], [name], RN 03/05/2015, 11:55 AM

## 2015-03-05 NOTE — Progress Notes (Signed)
PRE HD   

## 2015-03-05 NOTE — Plan of Care (Signed)
Problem: Phase II Progression Outcomes Goal: Other Phase II Outcomes/Goals Outcome: Not Applicable Date Met:  81/44/39 No additional Phase Outcome/Goals identified at this time.

## 2015-03-05 NOTE — Progress Notes (Signed)
[facility]  Daily Progress Note   Subjective  -   Patient did well with dialysis today. He denies right arm pain.  Objective Filed Vitals:   03/05/15 1730 03/05/15 1748 03/05/15 1800 03/05/15 1829  BP: 148/85 127/112 123/71 153/60  Pulse: 75 77 76 74  Temp:  98.2 F (36.8 C)  98.1 F (36.7 C)  TempSrc:  Axillary  Oral  Resp: 22 32 16 19  Height:      Weight:      SpO2:    100%    Intake/Output Summary (Last 24 hours) at 03/05/15 1841 Last data filed at 03/05/15 1411  Gross per 24 hour  Intake    580 ml  Output   1500 ml  Net   -920 ml    PULM  Normal effort , no use of accessory muscles CV  No JVD, RRR Abd      No distended, nontender VASC  right arm AV graft no thrill no bruit             Thigh catheter intact dressing clean and dry  Laboratory CBC    Component Value Date/Time   WBC 2.9* 03/05/2015 1555   WBC 3.9 02/17/2015 0630   HGB 8.4* 03/05/2015 1555   HGB 8.2* 02/19/2015 0447   HCT 26.4* 03/05/2015 1555   HCT 29.5* 02/17/2015 0630   PLT 121* 03/05/2015 1555   PLT 119* 02/17/2015 0630    BMET    Component Value Date/Time   NA 138 03/05/2015 1555   NA 133* 02/17/2015 0630   K 4.6 03/05/2015 1555   K 5.6* 02/19/2015 0447   CL 98* 03/05/2015 1555   CL 98* 02/17/2015 0630   CO2 31 03/05/2015 1555   CO2 19* 02/17/2015 0630   GLUCOSE 98 03/05/2015 1555   GLUCOSE 69 02/17/2015 0630   BUN 40* 03/05/2015 1555   BUN 90* 02/17/2015 0630   CREATININE 6.10* 03/05/2015 1555   CREATININE 10.06* 02/17/2015 0630   CALCIUM 7.4* 03/05/2015 1555   CALCIUM 8.1* 02/17/2015 0630   GFRNONAA 9* 03/05/2015 1555   GFRNONAA 5* 02/17/2015 0630   GFRAA 10* 03/05/2015 1555   GFRAA 6* 02/17/2015 0630    Assessment/Planning:  Complication of dialysis access: The patient will undergo thrombectomy of his AV graft tomorrow. Pending successful result he will be discharged.  End-stage renal disease on hemodialysis: He has continued his therapy.  Once he has achieved a stable access he will be discharged and continue as an outpatient    Renford DillsSchnier, Rhylee Nunn G  03/05/2015, 6:41 PM

## 2015-03-05 NOTE — Progress Notes (Signed)
POST HD 

## 2015-03-05 NOTE — Progress Notes (Addendum)
Initial Nutrition Assessment  INTERVENTION: Medical Food Supplement Therapy: will recommend  Nepro shake daily  NUTRITION DIAGNOSIS:  Increased nutrient needs related to chronic illness as evidenced by estimated needs as pt on HD  GOAL:  Patient will meet greater than or equal to 90% of their needs  MONITOR:  Energy Intake Electrolyte and renal Profile Glucose Profile Anthropometrics UOP  REASON FOR ASSESSMENT:  RD Screen, Diet Order   ASSESSMENT:  Pt admitted with AV fistula occlusion, temp cath placed yesterday HD performed after placement PMHx: ESRD on HD, hypothyroidism, hyperparathyroidism, HTN, bipolar disorder  PO Intake: Pt ate 100% of breakfast this am including home fries and eggs. Recorded po intake 50-100% of meals yesterday. Pt reports PTA good appetite, eating 3 meals per day.  Medications: Phoslo, vitamin D, Sensipar, Ferrous Fumarate, Folic Acid, Protonix, Rena-Vit  Labs: Electrolyte and Renal Profile:  Recent Labs Lab 03/03/15 1310 03/04/15 0557  BUN 54* 65*  CREATININE 7.47* 8.56*  NA 131* 135  K 5.2* 5.2*  Glucose Profile: No results for input(s): GLUCAP in the last 72 hours. Protein Profile:   Recent Labs Lab 03/03/15 1310  ALBUMIN 3.3*   Pt reports Usual dry weight of 185-190lbs Writer notes after HD weight of 181lbs  Height:  Ht Readings from Last 1 Encounters:  03/03/15 5\' 7"  (1.702 m)   Weight:  Wt Readings from Last 1 Encounters:  03/04/15 181 lb 14.1 oz (82.5 kg)    Wt Readings from Last 10 Encounters:  03/04/15 181 lb 14.1 oz (82.5 kg)    BMI:  Body mass index is 28.48 kg/(m^2).  Estimated Nutritional Needs:  Kcal:  2244-2618kcals, BEE: 1559kcals, (IF 1.2-1.4)(AF 1.2)   Protein:  98-123g protein (1.2-1.5g/kg)  Fluid:  UOP+104800mL  Diet Order:  Diet renal with fluid restriction Fluid restriction:: 1200 mL Fluid; Room service appropriate?: Yes; Fluid consistency:: Thin  EDUCATION NEEDS:  Education needs no  appropriate at this time   Intake/Output Summary (Last 24 hours) at 03/05/15 1423 Last data filed at 03/05/15 1411  Gross per 24 hour  Intake    580 ml  Output   1500 ml  Net   -920 ml   HD removed 1500mL yesterday  Last BM:  5/7  MODERATE Care Level  Leda QuailAllyson Walburga Hudman, RD, LDN Pager [phone]

## 2015-03-05 NOTE — Clinical Social Work Note (Signed)
CSW has left message for patient's brother, Kenton Kingfisherathaniel Buchanan, [phone]. Patient is known to this CSW from previous admissions. [facility] has stated they will be able to take patient back.  York SpanielMonica Luceal Hollibaugh MSW,LCSWA [phone]

## 2015-03-05 NOTE — Progress Notes (Signed)
   03/05/15 0600  Clinical Encounter Type  Visited With Patient  Visit Type Initial  Referral From Nurse  Spiritual Encounters  Spiritual Needs Prayer  Stress Factors  Patient Stress Factors Family relationships  I received a call from the department telling me that the patient was requesting prayers.  Patient asked me to pray for his nursing facility, his family and himself.  I prayed with him and also helped him call a family member at his request.  Pt was calm and satisfied afterwards.  [employer]bury Automotive GroupChaplain Kyle Luppino-pager [phone]

## 2015-03-05 NOTE — Progress Notes (Signed)
[facility]  ROUNDING NOTE   Subjective:   Admitted for hyperkalemia, potassium of 5.2 and clotted access. Patient unable to get dialysis on Saturday. Last treatment was Thursday. Patient seen during dialysis earlier today Declot scheduled for Tuesday, 5/10  Objective:  Vital signs in last 24 hours:  Temp:  [97.3 F (36.3 C)-98.2 F (36.8 C)] 98.1 F (36.7 C) (05/09 1829) Pulse Rate:  [69-139] 74 (05/09 1829) Resp:  [16-38] 19 (05/09 1829) BP: (123-158)/(60-130) 153/60 mmHg (05/09 1829) SpO2:  [90 %-100 %] 100 % (05/09 1829) Weight:  [88.5 kg (195 lb 1.7 oz)] 88.5 kg (195 lb 1.7 oz) (05/09 1800)  Weight change: -0.015 kg (-0.5 oz) Filed Weights   03/04/15 1630 03/04/15 2009 03/05/15 1800  Weight: 83.9 kg (184 lb 15.5 oz) 82.5 kg (181 lb 14.1 oz) 88.5 kg (195 lb 1.7 oz)    Intake/Output: I/O last 3 completed shifts: In: 940 [P.O.:940] Out: 2632 [Other:2632]   Intake/Output this shift:     Physical Exam: General: NAD,   Head: Normocephalic, atraumatic. Moist oral mucosal membranes  Eyes: Anicteric,  Neck: Supple, trachea midline  Lungs:  Clear to auscultation  Heart: Regular rate and rhythm  Abdomen:  Soft, nontender,   Extremities: trace peripheral edema.  Neurologic: Nonfocal, moving all four extremities  Skin: No lesions  Access: Right forearm AVG- clotted. Temp cath in place    Basic Metabolic Panel:  Recent Labs Lab 03/03/15 1310 03/04/15 0557 03/05/15 1555  NA 131* 135 138  K 5.2* 5.2* 4.6  CL 98* 100* 98*  CO2 22 25 31   GLUCOSE 82 70 98  BUN 54* 65* 40*  CREATININE 7.47* 8.56* 6.10*  CALCIUM 7.7* 8.2* 7.4*  PHOS  --   --  3.5    Liver Function Tests:  Recent Labs Lab 03/03/15 1310 03/05/15 1555  AST 16  --   ALT 7*  --   ALKPHOS 290*  --   BILITOT 0.5  --   PROT 6.1*  --   ALBUMIN 3.3* 2.8*   No results for input(s): LIPASE, AMYLASE in the last 168 hours. No results for input(s): AMMONIA in the last 168  hours.  CBC:  Recent Labs Lab 03/03/15 1310 03/04/15 0557 03/05/15 1555  WBC 3.5* 3.1* 2.9*  NEUTROABS 1.8  --   --   HGB 9.0* 8.9* 8.4*  HCT 28.0* 27.5* 26.4*  MCV 98.9 98.8 99.5  PLT 119* 131* 121*    Cardiac Enzymes: No results for input(s): CKTOTAL, CKMB, CKMBINDEX, TROPONINI in the last 168 hours.  BNP: Invalid input(s): POCBNP  CBG: No results for input(s): GLUCAP in the last 168 hours.  Microbiology: Results for orders placed or performed in visit on 02/20/14  Urine culture     Status: None   Collection Time: 02/20/14 10:15 PM  Result Value Ref Range Status   Micro Text Report   Final       SOURCE: SOURCE NOT INDICATED    COMMENT                   MIXED BACTERIAL ORGANISMS   COMMENT                   RESULTS SUGGESTIVE OF CONTAMINATION   ANTIBIOTIC  Coagulation Studies: No results for input(s): LABPROT, INR in the last 72 hours.  Urinalysis: No results for input(s): COLORURINE, LABSPEC, PHURINE, GLUCOSEU, HGBUR, BILIRUBINUR, KETONESUR, PROTEINUR, UROBILINOGEN, NITRITE, LEUKOCYTESUR in the last 72 hours.  Invalid input(s): APPERANCEUR    Imaging: No results found.   Medications:     . aspirin EC  81 mg Oral Daily  . benztropine  1 mg Oral BID  . calcium acetate  1,334 mg Oral TID WC  . [START ON 03/06/2015]  ceFAZolin (ANCEF) IV  1 g Intravenous On Call  . cholecalciferol  3,000 Units Oral Daily  . cinacalcet  90 mg Oral Daily  . citalopram  20 mg Oral Daily  . clonazePAM  0.5 mg Oral BID  . feeding supplement (NEPRO CARB STEADY)  237 mL Oral q morning - 10a  . Ferrous Fumarate  1 tablet Oral Daily  . folic acid  1 mg Oral Daily  . [START ON 03/06/2015] haloperidol  5 mg Oral Q T,Th,Sat-1800  . heparin  5,000 Units Subcutaneous 3 times per day  . levothyroxine  600 mcg Oral QAC breakfast  . metolazone  2.5 mg Oral Daily  . multivitamin  1 tablet Oral Daily  . omega-3 acid ethyl esters   1 g Oral BID  . oxcarbazepine  600 mg Oral BID  . pantoprazole  40 mg Oral Daily  . phenytoin  300 mg Oral QHS  . QUEtiapine  300 mg Oral BID   sodium chloride, sodium chloride, acetaminophen **OR** acetaminophen, albuterol, alteplase, docusate sodium, ethyl chloride, feeding supplement (NEPRO CARB STEADY), heparin, lidocaine (PF), lidocaine-prilocaine, oxyCODONE-acetaminophen, pentafluoroprop-tetrafluoroeth, QUEtiapine, senna  Assessment/ Plan:  64 y.o. black male with Bipolar Didorder, hypertension, Mental retardation, Anemia, SHPTH  Admitted on 03/03/2015 for clotted dialysis access and hyperkalemia  [facility]   1. End Stage Renal Disease: with hyperkalemia.  - extra HD today - AVG declot tomorrow  2.  Anemia of chronic kidney disease:  - epo with HD treatment.   4. Secondary Hyperparathyroidism: - Calcium acetate with meals.    LOS: 2 [name] Wiley 5/9/20169:23 PM

## 2015-03-05 NOTE — Progress Notes (Signed)
[facility] [place] at [facility]   PATIENT NAME: [name] JewettJames Wiley    MR#:  [id number]  DATE OF BIRTH:  11/15/1950  SUBJECTIVE:  CHIEF COMPLAINT:   Chief Complaint  Patient presents with  . Vascular Access Problem    dialy fistual noted clotted with atempted dialysis this am, pt from [facility], states fistula R arm   No complaints.  Had temp. Dialysis cath placed yesterday and had HD yesterday.    REVIEW OF SYSTEMS:    Review of Systems  Constitutional: Negative for fever and chills.  HENT: Negative for tinnitus.   Eyes: Negative for blurred vision and double vision.  Respiratory: Negative for cough and shortness of breath.   Cardiovascular: Negative for chest pain, orthopnea and PND.  Gastrointestinal: Negative for nausea, vomiting, abdominal pain and diarrhea.  Genitourinary: Negative for dysuria and hematuria.  Neurological: Negative for tingling, sensory change and focal weakness.  All other systems reviewed and are negative.   Nutrition: Renal Diet.  Tolerating Diet: Renal Diet     DRUG ALLERGIES:  No Known Allergies  VITALS:  Blood pressure 158/66, pulse 69, temperature 97.4 F (36.3 C), temperature source Oral, resp. rate 20, height 5\' 7"  (1.702 m), weight 82.5 kg (181 lb 14.1 oz), SpO2 100 %.  PHYSICAL EXAMINATION:   Physical Exam  GENERAL:  64 y.o.-year-old patient lying in the bed with no acute distress.  EYES: Pupils equal, round, reactive to light and accommodation. No scleral icterus. Extraocular muscles intact.  HEENT: Head atraumatic, normocephalic. Oropharynx and nasopharynx clear.  NECK:  Supple, no jugular venous distention. No thyroid enlargement, no tenderness.  LUNGS: Normal breath sounds bilaterally, no wheezing, rales, rhonchi. No use of accessory muscles  CARDIOVASCULAR: S1, S2 normal. No murmurs, rubs, or gallops.  ABDOMEN: Soft, nontender, nondistended. Bowel sounds present. No organomegaly or mass.   EXTREMITIES: No cyanosis, clubbing or edema b/l.    NEUROLOGIC: Cranial nerves II through XII are intact. No focal Motor or sensory deficits b/l.   PSYCHIATRIC: Anxious.  SKIN: No obvious rash, lesion, or ulcer.    LABORATORY PANEL:   CBC  Recent Labs Lab 03/04/15 0557  WBC 3.1*  HGB 8.9*  HCT 27.5*  PLT 131*   ------------------------------------------------------------------------------------------------------------------  Chemistries   Recent Labs Lab 03/03/15 1310 03/04/15 0557  NA 131* 135  K 5.2* 5.2*  CL 98* 100*  CO2 22 25  GLUCOSE 82 70  BUN 54* 65*  CREATININE 7.47* 8.56*  CALCIUM 7.7* 8.2*  AST 16  --   ALT 7*  --   ALKPHOS 290*  --   BILITOT 0.5  --    ------------------------------------------------------------------------------------------------------------------  Cardiac Enzymes No results for input(s): TROPONINI in the last 168 hours. ------------------------------------------------------------------------------------------------------------------  RADIOLOGY:  Dg Chest Portable 1 View  03/03/2015   CLINICAL DATA:  Vascular access problems.  EXAM: PORTABLE CHEST - 1 VIEW  COMPARISON:  02/16/2015.  FINDINGS: Dual lead LEFT subclavian pacemaker. Cardiomegaly para pulmonary vascular congestion. No airspace consolidation. No pleural effusion. Extensive vascular stenting in the RIGHT upper extremity knee vessels. LEFT-greater-than-RIGHT glenohumeral osteoarthritis. The bones are diffusely dense compatible with renal osteodystrophy.  IMPRESSION: Cardiomegaly and pulmonary vascular congestion.   Electronically Signed   By: Andreas NewportGeoffrey  Lamke M.D.   On: 03/03/2015 16:59     ASSESSMENT AND PLAN:   [name] Wiley is a 64 y.o. male with a known history of end-stage renal disease on Tuesday Thursday Saturday hemodialysis, hypertension, bipolar disorder and seizure disorder, low intellectual capacity  comes from [facility] home for clotted AV  graft.  * thrombosed hemodialysis graft - plan for declot on Tuesday of this week.  - s/p temp dialysis cath yesterday and s/p HD yesterday.  - discussed w/ Nephro and will have HD again today.   * Hyperkalemia - likely due to ESRD and pt. Had HD yesterday and to have it again today.  - will check potassium tomorrow a.m.   * end-stage renal disease on hemodialysis - on Tuesday Thursday Saturday hemodialysis.  - Nephrology following.  S/p Temp cath and had HD yesterday and will have it today too.   * bipolar order - cont. Trileptal, Seroquel.   * Seizures disorder - no acute seizures.  - cont. Dilantin.   * hypothyroidism - continue Synthroid  * Secondary Hyperparathyroidism - cont. Sensipar.    All the records are reviewed and case discussed with Care Management/Social Workerr. Management plans discussed with the patient, family and they are in agreement.  CODE STATUS: Full  DVT Prophylaxis: Ambulatory, Heparin SQ  TOTAL TIME TAKING CARE OF THIS PATIENT: 30 minutes.   D/c back to SNF tomorrow after AV graft declotted.  Discussed w/ Dr. Gilda CreaseSchnier & Dr. Thedore MinsSingh.    [place] SirenSAINANI,Amyre Segundo J M.D on 03/05/2015 at 11:26 AM  Between 7am to 6pm - Pager - [phone]  After 6pm go to [email or web address] - password [email or web address] [facility]  [facility]  Office  [phone]  CC: Primary care physician; [facility]

## 2015-03-05 NOTE — Progress Notes (Signed)
PT Cancellation Note  Patient Details Name: [name] Wiley MRN: [id number] DOB: 05/22/1951   Cancelled Treatment:    Reason Eval/Treat Not Completed: Patient at procedure or test/unavailable (Patient currently off unit for dialysis; will re-attempt at later time/date as patient available and medically appropriate.)  Ronell Boldin H. Manson PasseyBrown, PT, DPT 03/05/2015, 2:38 PM [phone]

## 2015-03-05 NOTE — Plan of Care (Signed)
Problem: Phase I Progression Outcomes Goal: Other Phase I Outcomes/Goals Outcome: Not Applicable Date Met:  03/05/15 No additional Phase Outcome/Goals identified at this time.     

## 2015-03-05 NOTE — Clinical Documentation Improvement (Signed)
Current notes reflect "Mild CHF by x-ray." Please identify acuity and type of CHF if known,and document in progress notes and discharge summary.  Acuity: Acute Chronic Acute on Chronic  Type: Diastolic Systolic Combined Diastolic/Systolic  Sharyn Creameronna Pernie Grosso, BSN, RN [place] HIM/Clinical Documentation Specialist [email or web address] [phone]

## 2015-03-05 NOTE — Care Management Note (Signed)
Case Management Note  Patient Details  Name: [name] Wiley MRN: [id number] Date of Birth: 06/25/1951  Subjective/Objective:                   Important Message from Medicare given.  Action/Plan:   Expected Discharge Date:                  Expected Discharge Plan:     In-House Referral:     Discharge planning Services     Post Acute Care Choice:    Choice offered to:     DME Arranged:    DME Agency:     HH Arranged:    HH Agency:     Status of Service:     Medicare Important Message Given:  Yes Date Medicare IM Given:    Medicare IM give by:    Date Additional Medicare IM Given:  03/05/15 Additional Medicare Important Message give by:     If discussed at Long Length of Stay Meetings, dates discussed:    Additional Comments:  [name], Tonea Leiphart L, RN 03/05/2015, 10:52 AM

## 2015-03-05 NOTE — Clinical Social Work Note (Signed)
Clinical Social Work Assessment  Patient Details  Name: [name] JewettJames Wiley MRN: [id number] Date of Birth: 04/19/1951  Date of referral:  03/05/15               Reason for consult:  Facility Placement                Permission sought to share information with:  Family Supports Permission granted to share information::  Yes, Verbal Permission Granted  Name::      [name] Barefoot([name] Wiley)  Agency::     Relationship::     Contact Information:     Housing/Transportation Living arrangements for the past 2 months:  [facility] Source of Information:  Other (Comment Required) (sibling) Patient Interpreter Needed:  None Criminal Activity/Legal Involvement Pertinent to Current Situation/Hospitalization:  No - Comment as needed Significant Relationships:    Lives with:  Siblings Do you feel safe going back to the place where you live?  Yes Need for family participation in patient care:  No (Coment)  Care giving concerns:  None noted.   Social Worker assessment / plan:  CSW spoke with patient's brother: [name] Barefootathaniel: [phone] and [name] Barefootathaniel wishes for patient to be able to return to [facility] when time. Patient is a long term resident at that facility. [name] Barefootathaniel will sometimes transport his brother back to [facility] when time. [name] at [facility] states he can accept patient back when time. Patient's baseline is one of yelling out and appearing upset at times.   Employment status:  Disabled (Comment on whether or not currently receiving Disability) Insurance information:  Medicare PT Recommendations:  Not assessed at this time Information / Referral to community resources:  Skilled Nursing Facility  Patient/Family's Response to care:  In agreement with return to [facility] when time.  Patient/Family's Understanding of and Emotional Response to Diagnosis, Current Treatment, and Prognosis:  Patients' brother is very involved with patient's care and stays knowledgeable about what is going on with  patient.   Emotional Assessment Appearance:    Attitude/Demeanor/Rapport:  Angry Affect (typically observed):  Angry Orientation:  Oriented to Self Alcohol / Substance use:  Not Applicable Psych involvement (Current and /or in the community):  No (Comment)  Discharge Needs  Concerns to be addressed:  No discharge needs identified Readmission within the last 30 days:  No Current discharge risk:  None Barriers to Discharge:  No Barriers Identified   York SpanielMonica Astraea Gaughran, LCSW 03/05/2015, 2:23 PM

## 2015-03-05 NOTE — Plan of Care (Signed)
Problem: Phase III Progression Outcomes Goal: Other Phase III Outcomes/Goals Outcome: Not Applicable Date Met:  74/82/70 No additional Phase Outcome/Goals identified at this time.

## 2015-03-05 NOTE — Clinical Documentation Improvement (Signed)
03/03/15 CXR report:  "Dual lead LEFT subclavian pacemaker."  Please confirm in progress notes and discharge summary presence of pacemaker.  Sharyn Creameronna Tiera Mensinger, BSN, RN [place] [facility]/Clinical Documentation Specialist [email or web address] [email or web address] [phone]

## 2015-03-06 ENCOUNTER — Encounter
Admission: EM | Disposition: A | Discharge status: Skilled Nursing Facility | Payer: Medicare Other | Source: Home / Self Care | Attending: Specialist

## 2015-03-06 ENCOUNTER — Encounter: Payer: Self-pay | Admitting: *Deleted

## 2015-03-06 HISTORY — PX: FISTULOGRAM: SHX5832

## 2015-03-06 LAB — POTASSIUM (ARMC VASCULAR LAB ONLY): POTASSIUM (ARMC VASCULAR LAB): 3.8

## 2015-03-06 SURGERY — FISTULOGRAM
Laterality: Right | Wound class: Clean

## 2015-03-06 MED ORDER — CEFAZOLIN SODIUM 1-5 GM-% IV SOLN
1.0000 g | Freq: Once | INTRAVENOUS | Status: AC
  Administered 2015-03-06: 1 g via INTRAVENOUS

## 2015-03-06 MED ORDER — OXYCODONE-ACETAMINOPHEN 5-325 MG PO TABS: 1.0000 | ORAL_TABLET | Freq: Four times a day (QID) | ORAL | Status: DC | PRN

## 2015-03-06 MED ORDER — ALTEPLASE 100 MG IV SOLR
INTRAVENOUS | Status: DC | PRN
  Administered 2015-03-06: 6 mg

## 2015-03-06 MED ORDER — FERROUS FUMARATE 325 (106 FE) MG PO TABS: 1.0000 | ORAL_TABLET | Freq: Every day | ORAL | Status: DC

## 2015-03-06 MED ORDER — HEPARIN SODIUM (PORCINE) 1000 UNIT/ML IJ SOLN
INTRAMUSCULAR | Status: DC | PRN
Start: 2015-03-06 — End: 2015-03-06
  Administered 2015-03-06: 4000 [IU] via INTRAVENOUS

## 2015-03-06 MED ORDER — FENTANYL CITRATE (PF) 100 MCG/2ML IJ SOLN
INTRAMUSCULAR | Status: DC | PRN
  Administered 2015-03-06 (×2): 50 ug via INTRAVENOUS

## 2015-03-06 MED ORDER — MIDAZOLAM HCL 2 MG/2ML IJ SOLN
INTRAMUSCULAR | Status: DC | PRN
  Administered 2015-03-06 (×2): 2 mg via INTRAVENOUS

## 2015-03-06 MED ORDER — SODIUM CHLORIDE 0.9 % IV SOLN: INTRAVENOUS | Status: DC

## 2015-03-06 MED ORDER — LIDOCAINE HCL 1 % IJ SOLN
INTRAMUSCULAR | Status: DC | PRN
  Administered 2015-03-06: 10 mL via INTRADERMAL

## 2015-03-06 MED FILL — Ferrous Fumarate Tab 324 MG (106 MG Elemental Fe): ORAL | Qty: 1 | Status: AC

## 2015-03-06 SURGICAL SUPPLY — 17 items
BALLN DORADO 10X40X80 (BALLOONS) ×3
BALLN ULTRVRSE 8X60X75C (BALLOONS) ×3
BALLOON DORADO 10X40X80 (BALLOONS) ×1 IMPLANT
BALLOON ULTRVRSE 8X60X75C (BALLOONS) ×1 IMPLANT
CATH SLIP KMP 65CM 5FR (CATHETERS) ×3 IMPLANT
DRAPE BRACHIAL (DRAPES) ×3 IMPLANT
GLIDECATH 4FR STR (CATHETERS) ×3 IMPLANT
GLIDEWIRE ADV .035X180CM (WIRE) ×3 IMPLANT
GRAFT STENT FLAIR ENDOVAS 9X50 (Permanent Stent) ×3 IMPLANT
KIT THROMB PERC PTD (MISCELLANEOUS) ×3 IMPLANT
PACK ANGIOGRAPHY (CUSTOM PROCEDURE TRAY) ×3 IMPLANT
SET INTRO CAPELLA COAXIAL (SET/KITS/TRAYS/PACK) ×3 IMPLANT
SHEATH BRITE TIP 6FRX5.5 (SHEATH) ×3 IMPLANT
SHEATH BRITE TIP 7FRX5.5 (SHEATH) ×3 IMPLANT
STENT FLAIR STR 8X70 (Permanent Stent) ×3 IMPLANT
TOWEL OR 17X26 4PK STRL BLUE (TOWEL DISPOSABLE) ×3 IMPLANT
WIRE MAGIC TOR.035 180C (WIRE) ×3 IMPLANT

## 2015-03-06 NOTE — Progress Notes (Signed)
PT Cancellation Note  Patient Details Name: [name] Wiley MRN: [id number] DOB: 08/10/1951   Cancelled Treatment:    Reason Eval/Treat Not Completed: Medical issues which prohibited therapy (Pt post procedure). Pt completely under covers from head to toe. Unable to effectively communicate making only garbled sounds. Spoke with nursing who notes this is not pt's baseline, as is post procedure and still recovering. Nursing also notes pt has a discharge pending bed at this time.   Elsie StainHeidi Elizabeth Bishop 03/06/2015, 2:36 PM

## 2015-03-06 NOTE — Discharge Summary (Signed)
[facility] [place] at [facility]   PATIENT NAME: [name] Wiley    MR#:  [id number]  DATE OF BIRTH:  04/21/1951  DATE OF ADMISSION:  03/03/2015 ADMITTING PHYSICIAN: Enid Baasadhika Kalisetti, MD  DATE OF DISCHARGE: 03/06/2015  PRIMARY CARE PHYSICIAN: [facility]    ADMISSION DIAGNOSIS:  Clotted dialysis shunt, initial encounter [T85.86XA]  DISCHARGE DIAGNOSIS:  Principal Problem:   AV fistula occlusion   SECONDARY DIAGNOSIS:   Past Medical History  Diagnosis Date  . Dialysis patient   . Hypertension   . Intellectual disability   . Seizures   . GERD (gastroesophageal reflux disease)   . Renal disorder   . Hypothyroid   . CAD (coronary artery disease)   . Anxiety     HOSPITAL COURSE:   [name] JewettJames Muckle is a 64 y.o. male with a known history of end-stage renal disease on Tuesday Thursday Saturday hemodialysis, hypertension, bipolar disorder and seizure disorder, low intellectual capacity comes from [facility] for clotted AV graft.  * thrombosed hemodialysis graft - patient was seen by vascular surgery and then underwent a temporary dialysis catheter placement and had dialysis. Patient underwent declot of his thrombosed hemodialysis graft on 03/06/2015 tolerated procedure well with no acute bleeding and now is being discharged back to skilled nursing facility  * Hyperkalemia - likely due to ESRD. Patient underwent dialysis on May 8 and ninth and his potassium postdialysis is improved resolved  * end-stage renal disease on hemodialysis - on Tuesday Thursday Saturday hemodialysis.  - Patient underwent a temporary hemodialysis catheter placement which is not to be removed as his AV fistula has been declotted.  Nephrology did follow the patient and patient was dialyzed twice in the hospital and will resume his normal schedule as mentioned.  * bipolar order - she was stable and use Trileptal and Seroquel  * Seizures disorder - she  had no acute seizures we'll continue his Dilantin as stated  * hypothyroidism - she was making her Synthroid he will resume that  * Secondary Hyperparathyroidism - patient was maintained on a Sensipar will resume that.     DISCHARGE CONDITIONS:   Stable.   CONSULTS OBTAINED:  Treatment Team:  Renford DillsGregory G Schnier, MD Lamont DowdySarath Kolluru, MD  DRUG ALLERGIES:  No Known Allergies  DISCHARGE MEDICATIONS:   Current Discharge Medication List    CONTINUE these medications which have NOT CHANGED   Details  aspirin EC 81 MG tablet Take 81 mg by mouth daily.    benztropine (COGENTIN) 1 MG tablet Take 1 mg by mouth 2 (two) times daily.    calcium acetate (PHOSLO) 667 MG capsule Take 1,334 mg by mouth 3 (three) times daily with meals.    cholecalciferol (VITAMIN D) 1000 UNITS tablet Take 3,000 Units by mouth daily.    cinacalcet (SENSIPAR) 90 MG tablet Take 90 mg by mouth daily.    citalopram (CELEXA) 20 MG tablet Take 20 mg by mouth daily.    clonazePAM (KLONOPIN) 0.5 MG tablet Take 0.5 mg by mouth 2 (two) times daily. *hold for sedation*    Ferrous Fumarate 324 (106 FE) MG TABS Take 1 tablet by mouth daily.    folic acid (FOLVITE) 400 MCG tablet Take 800 mcg by mouth daily.    haloperidol (HALDOL) 5 MG tablet Take 5 mg by mouth 3 (three) times a week. Pt takes on Tuesday, Thursday, and Saturday.    levothyroxine (SYNTHROID, LEVOTHROID) 300 MCG tablet Take 600 mcg by mouth daily before  breakfast.    metolazone (ZAROXOLYN) 2.5 MG tablet Take 2.5 mg by mouth daily.    multivitamin (RENA-VIT) TABS tablet Take 1 tablet by mouth daily.    omega-3 acid ethyl esters (LOVAZA) 1 G capsule Take 1 g by mouth 2 (two) times daily.    omeprazole (PRILOSEC OTC) 20 MG tablet Take 20 mg by mouth daily.    oxcarbazepine (TRILEPTAL) 600 MG tablet Take 600 mg by mouth 2 (two) times daily.    oxyCODONE-acetaminophen (PERCOCET/ROXICET) 5-325 MG per tablet Take 1 tablet by mouth every 6 (six) hours as  needed for moderate pain or severe pain.    phenytoin (DILANTIN) 100 MG ER capsule Take 300 mg by mouth at bedtime.    !! QUEtiapine (SEROQUEL XR) 300 MG 24 hr tablet Take 300 mg by mouth 2 (two) times daily. At 0600 and 1700.    !! QUEtiapine (SEROQUEL XR) 50 MG TB24 24 hr tablet Take 50 mg by mouth every 8 (eight) hours as needed (for psychosis or agitation.).     !! - Potential duplicate medications found. Please discuss with provider.       DISCHARGE INSTRUCTIONS:   DIET:  Renal diet  DISCHARGE CONDITION:  Stable  ACTIVITY:  Activity as tolerated  OXYGEN:  Home Oxygen: No.   Oxygen Delivery: room air  DISCHARGE LOCATION:  nursing home   If you experience worsening of your admission symptoms, develop shortness of breath, life threatening emergency, suicidal or homicidal thoughts you must seek medical attention immediately by calling 911 or calling your MD immediately  if symptoms less severe.  You Must read complete instructions/literature along with all the possible adverse reactions/side effects for all the Medicines you take and that have been prescribed to you. Take any new Medicines after you have completely understood and accpet all the possible adverse reactions/side effects.   Please note  You were cared for by a hospitalist during your hospital stay. If you have any questions about your discharge medications or the care you received while you were in the hospital after you are discharged, you can call the unit and asked to speak with the hospitalist on call if the hospitalist that took care of you is not available. Once you are discharged, your primary care physician will handle any further medical issues. Please note that NO REFILLS for any discharge medications will be authorized once you are discharged, as it is imperative that you return to your primary care physician (or establish a relationship with a primary care physician if you do not have one) for your  aftercare needs so that they can reassess your need for medications and monitor your lab values.     Today    VITAL SIGNS:  Blood pressure 164/83, pulse 70, temperature 97.7 F (36.5 C), temperature source Oral, resp. rate 23, height  (1.702 m), weight 86.32 kg (190 lb 4.8 oz), SpO2 100 %.  I/O:   Intake/Output Summary (Last 24 hours) at 03/06/15 1319 Last data filed at 03/06/15 0745  Gross per 24 hour  Intake    530 ml  Output   1132 ml  Net   -602 ml    PHYSICAL EXAMINATION:  GENERAL:  64 y.o.-year-old patient lying in the bed with no acute distress.  EYES: Pupils equal, round, reactive to light and accommodation. No scleral icterus. Extraocular muscles intact.  HEENT: Head atraumatic, normocephalic. Oropharynx and nasopharynx clear.  NECK:  Supple, no jugular venous distention. No thyroid enlargement, no tenderness.  LUNGS: Normal breath sounds bilaterally, no wheezing, rales,rhonchi or crepitation. No use of accessory muscles of respiration.  CARDIOVASCULAR: S1, S2 normal. No murmurs, rubs, or gallops.  ABDOMEN: Soft, non-tender, non-distended. Bowel sounds present. No organomegaly or mass.  EXTREMITIES: No pedal edema, cyanosis, or clubbing.  NEUROLOGIC: Cranial nerves II through XII are intact. No focal motor or sensory defecits b/l.  PSYCHIATRIC: The patient is alert and oriented x 3.  SKIN: No obvious rash, lesion, or ulcer.   DATA REVIEW:   CBC  Recent Labs Lab 03/05/15 1555  WBC 2.9*  HGB 8.4*  HCT 26.4*  PLT 121*    Chemistries   Recent Labs Lab 03/03/15 1310  03/05/15 1555  NA 131*  < > 138  K 5.2*  < > 4.6  CL 98*  < > 98*  CO2 22  < > 31  GLUCOSE 82  < > 98  BUN 54*  < > 40*  CREATININE 7.47*  < > 6.10*  CALCIUM 7.7*  < > 7.4*  AST 16  --   --   ALT 7*  --   --   ALKPHOS 290*  --   --   BILITOT 0.5  --   --   < > = values in this interval not displayed.  Cardiac Enzymes No results for input(s): TROPONINI in the last 168  hours.  Microbiology Results  Results for orders placed or performed in visit on 02/20/14  Urine culture     Status: None   Collection Time: 02/20/14 10:15 PM  Result Value Ref Range Status   Micro Text Report   Final       SOURCE: SOURCE NOT INDICATED    COMMENT                   MIXED BACTERIAL ORGANISMS   COMMENT                   RESULTS SUGGESTIVE OF CONTAMINATION   ANTIBIOTIC                                                        RADIOLOGY:  No results found.    Management plans discussed with the patient, family and they are in agreement.  CODE STATUS:     Code Status Orders        Start     Ordered   03/03/15 1843  Full code   Continuous     03/03/15 1842    Advance Directive Documentation        Most Recent Value   Type of Advance Directive  Living will   Pre-existing out of facility DNR order (yellow form or pink MOST form)     "MOST" Form in Place?        TOTAL TIME TAKING CARE OF THIS PATIENT: 35 minutes.    [place] SirenSAINANI,VIVEK J M.D on 03/06/2015 at 1:19 PM  Between 7am to 6pm - Pager - [phone]  After 6pm go to [email or web address] - password [email or web address] [facility]  [facility]  Office  [phone]  CC: Primary care physician; [facility]

## 2015-03-06 NOTE — Discharge Instructions (Signed)
°  DIET:  Renal diet  DISCHARGE CONDITION:  Good  ACTIVITY:  Activity as tolerated  OXYGEN:  Home Oxygen: No.   Oxygen Delivery: room air  DISCHARGE LOCATION:  nursing home   If you experience worsening of your admission symptoms, develop shortness of breath, life threatening emergency, suicidal or homicidal thoughts you must seek medical attention immediately by calling 911 or calling your MD immediately  if symptoms less severe.  You Must read complete instructions/literature along with all the possible adverse reactions/side effects for all the Medicines you take and that have been prescribed to you. Take any new Medicines after you have completely understood and accpet all the possible adverse reactions/side effects.   Please note  You were cared for by a hospitalist during your hospital stay. If you have any questions about your discharge medications or the care you received while you were in the hospital after you are discharged, you can call the unit and asked to speak with the hospitalist on call if the hospitalist that took care of you is not available. Once you are discharged, your primary care physician will handle any further medical issues. Please note that NO REFILLS for any discharge medications will be authorized once you are discharged, as it is imperative that you return to your primary care physician (or establish a relationship with a primary care physician if you do not have one) for your aftercare needs so that they can reassess your need for medications and monitor your lab values.

## 2015-03-06 NOTE — Progress Notes (Signed)
Refused mouth care at this time

## 2015-03-06 NOTE — Op Note (Signed)
OPERATIVE NOTE   PROCEDURE: 1. Right brachial axillary arteriovenous graft cannulation under ultrasound guidance 2. right arm graftogram 3. Infusion thrombolysis right arm brachial axillary dialysis graft. 4. Mechanical thrombectomy right arm brachial axillary dialysis graft. 5. Percutaneous transluminal and plasty and stent placement central portion right arm brachial axillary dialysis graft 6. 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  ANESTHESIA: Conscious Sedation   ESTIMATED BLOOD LOSS: minimal  FINDING(S): 1. Thrombus within graft. There is a greater than 80% stenosis of the midportion of AV graft itself in the region that is cannulated on a regular basis. There is a 60-70% stenosis within the subclavian vein and the central portion of the previously placed stent.  SPECIMEN(S):  None  CONTRAST: 60 cc  FLUOROSCOPY TIME: 8.8 minutes  INDICATIONS: [name] Wiley is a 63 y.o. male who  presents with malfunctioning right AV access.  The patient is scheduled for right AV graft him back to me with intervention .  The patient is aware the risks include but are not limited to: bleeding, infection, thrombosis of the cannulated access, and possible anaphylactic reaction to the contrast.  The patient acknowledges if the access can not be salvaged a tunneled catheter will be needed and will be placed during this procedure.  The patient is aware of the risks of the procedure and elects to proceed with the angiogram and intervention.  DESCRIPTION: After full informed written consent was obtained, the patient was brought back to the Special Procedure suite  and placed supine position.  Appropriate cardiopulmonary monitors were placed.  The right arm was prepped and draped in the standard fashion.  Appropriate timeout is called. The right brachial axillary graft  was cannulated with a micropuncture needle using ultrasound guidance.  The microwire was advanced and the needle was exchanged for  a microsheath.  The J-wire was then advanced and a 6 Fr sheath inserted.  Hand injections were completed to image the access from the arterial anastomosis through the entire access.  The central venous structures were also imaged by hand injections.  Based on the images,  thrombus is noted within the graft. Therefore 6 mg of TPA was reconstituted in approximately 10 cc and then laced throughout the graft and extending into the central venous system where clot was identified on the initial imaging. This is allowed to dwell.  Trerotola device was then advanced through the 6 French sheath into the central venous system keeping it within the previously placed stents. The basket was opened and the device is engaged. Multiple passes are made. Follow-up imaging demonstrates a narrowing within the central portion of the graft as well as a narrowing within the mid subclavian region within a previously placed stent. 4000 units of heparin was given prior to initiating mechanical thrombectomy.  Using ultrasound a second sheath was inserted in a retrograde direction at the level of the deltoid. Floppy Glidewire and straight catheter was then  advanced into the arterial system and imaging is obtained demonstrating patency of the brachial artery throughout the visualized field and a small cul-de-sac at the anastomotic site. Having gained this landmark the Trerotola device is then advanced through the retrograde sheath is opened and then pulled into the graft itself and engaged several passes are made. Flow was now reestablished through the graft. The graft remained strongly pulsatile and on  forward imaging there is now near occlusion of the central portion of the graft secondary to the previously noted stenosis in conjunction with a 6 French sheath extending through this area. Given that he recently underwent thrombectomy and angioplasty at the site I elected to place a stent as the and plasty alone did not prove to be durable.  A 7 French sheath is then exchanged for the 6 French antegrade sheath. An 8 x 70 fluency stent is then prepped on the back table the sheath was removed and the fluency is advanced over the wire. It is deployed across the lesion. A second 8 x 50 fluency is then used to complete coverage of the affected area. A 8 x 60 balloon is then advanced across the stents and inflated to 12 atm each inflation is to proximal for approximate 30 seconds. The balloon reaches full profile. Follow-up imaging demonstrates an excellent result with less than 5% residual stenosis.  Attention is then turned to the previously identified stenosis within the subclavian and a 10 x 40 [place] balloon is advanced across this lesion 2 separate inflations are made both to 20 atm for approximately 1 minute. Follow-up imaging through this area also demonstrates an excellent result with less than 10% residual stenosis.  The graft is then imaged with contrast and noted be free of thrombus with rapid flow and minimal residual stenosis. It should be noted that upon deployment of the stents within the graft itself the retrograde sheath was removed and then a purse string suture of 4-0 Monocryl was placed to control the skin puncture site. At this time having demonstrated the graft is widely patent throughout it's course a 4-0 Monocryl was placed around the 7 French antegrade sheath and the sheath was removed and the sutures used to control bleeding hereto.Marland Kitchen    A 4-0 Monocryl purse-string suture was sewn around the sheath.  The sheath was removed and light pressure was applied.  A sterile bandage was applied to  the puncture site.  INTERPRETATION: Initial images demonstrated thrombus throughout the graft as well as the previously placed stents that extended into the central venous system. Following infusion of TPA with mechanical thrombectomy successful elimination of the thrombus throughout the system and is been achieved. 2 high-grade stenoses are uncovered one in the central portion of the graft and this is treated with stent and follow-up angioplasty to 8 mm. The second is within the central venous system at the subclavian level and this is treated with a 10 mm balloon inflation.  Successful salvage of right arm brachial axillary dialysis graft    COMPLICATIONS: None  CONDITION: Carlynn Purl, M.D [facility] Office: [phone]  03/06/2015 12:09 PM

## 2015-03-14 ENCOUNTER — Encounter: Payer: Self-pay | Admitting: Vascular Surgery

## 2016-04-26 ENCOUNTER — Inpatient Hospital Stay
Admission: EM | Admit: 2016-04-26 | Discharge: 2016-05-01 | DRG: 252 | Disposition: A | Discharge status: Skilled Nursing Facility | Payer: Medicare Other | Attending: Internal Medicine | Admitting: Internal Medicine

## 2016-04-26 DIAGNOSIS — Z9889 Other specified postprocedural states: Secondary | ICD-10-CM | POA: Diagnosis not present

## 2016-04-26 DIAGNOSIS — E039 Hypothyroidism, unspecified: Secondary | ICD-10-CM | POA: Diagnosis present

## 2016-04-26 DIAGNOSIS — I120 Hypertensive chronic kidney disease with stage 5 chronic kidney disease or end stage renal disease: Secondary | ICD-10-CM | POA: Diagnosis present

## 2016-04-26 DIAGNOSIS — F419 Anxiety disorder, unspecified: Secondary | ICD-10-CM | POA: Diagnosis present

## 2016-04-26 DIAGNOSIS — K5900 Constipation, unspecified: Secondary | ICD-10-CM | POA: Diagnosis present

## 2016-04-26 DIAGNOSIS — E871 Hypo-osmolality and hyponatremia: Secondary | ICD-10-CM

## 2016-04-26 DIAGNOSIS — D61818 Other pancytopenia: Secondary | ICD-10-CM

## 2016-04-26 DIAGNOSIS — T82590A Other mechanical complication of surgically created arteriovenous fistula, initial encounter: Secondary | ICD-10-CM

## 2016-04-26 DIAGNOSIS — N186 End stage renal disease: Secondary | ICD-10-CM | POA: Diagnosis present

## 2016-04-26 DIAGNOSIS — I2510 Atherosclerotic heart disease of native coronary artery without angina pectoris: Secondary | ICD-10-CM | POA: Diagnosis present

## 2016-04-26 DIAGNOSIS — Z8249 Family history of ischemic heart disease and other diseases of the circulatory system: Secondary | ICD-10-CM

## 2016-04-26 DIAGNOSIS — F79 Unspecified intellectual disabilities: Secondary | ICD-10-CM | POA: Diagnosis present

## 2016-04-26 DIAGNOSIS — Z7982 Long term (current) use of aspirin: Secondary | ICD-10-CM

## 2016-04-26 DIAGNOSIS — N179 Acute kidney failure, unspecified: Secondary | ICD-10-CM

## 2016-04-26 DIAGNOSIS — N189 Chronic kidney disease, unspecified: Secondary | ICD-10-CM

## 2016-04-26 DIAGNOSIS — E8770 Fluid overload, unspecified: Secondary | ICD-10-CM | POA: Diagnosis present

## 2016-04-26 DIAGNOSIS — R569 Unspecified convulsions: Secondary | ICD-10-CM | POA: Diagnosis present

## 2016-04-26 DIAGNOSIS — K219 Gastro-esophageal reflux disease without esophagitis: Secondary | ICD-10-CM | POA: Diagnosis present

## 2016-04-26 DIAGNOSIS — D631 Anemia in chronic kidney disease: Secondary | ICD-10-CM | POA: Diagnosis present

## 2016-04-26 DIAGNOSIS — Z79899 Other long term (current) drug therapy: Secondary | ICD-10-CM

## 2016-04-26 DIAGNOSIS — E875 Hyperkalemia: Secondary | ICD-10-CM | POA: Diagnosis present

## 2016-04-26 DIAGNOSIS — T82868A Thrombosis of vascular prosthetic devices, implants and grafts, initial encounter: Principal | ICD-10-CM

## 2016-04-26 DIAGNOSIS — Y832 Surgical operation with anastomosis, bypass or graft as the cause of abnormal reaction of the patient, or of later complication, without mention of misadventure at the time of the procedure: Secondary | ICD-10-CM | POA: Diagnosis present

## 2016-04-26 DIAGNOSIS — N2581 Secondary hyperparathyroidism of renal origin: Secondary | ICD-10-CM | POA: Diagnosis present

## 2016-04-26 DIAGNOSIS — Z992 Dependence on renal dialysis: Secondary | ICD-10-CM | POA: Diagnosis not present

## 2016-04-26 LAB — COMPREHENSIVE METABOLIC PANEL
ALK PHOS: 195 U/L — AB (ref 38–126)
ALT: 15 U/L — ABNORMAL LOW (ref 17–63)
ANION GAP: 14 (ref 5–15)
AST: 28 U/L (ref 15–41)
Albumin: 3.1 g/dL — ABNORMAL LOW (ref 3.5–5.0)
BILIRUBIN TOTAL: 0.7 mg/dL (ref 0.3–1.2)
BUN: 65 mg/dL — ABNORMAL HIGH (ref 6–20)
CALCIUM: 8.5 mg/dL — AB (ref 8.9–10.3)
CO2: 23 mmol/L (ref 22–32)
Chloride: 96 mmol/L — ABNORMAL LOW (ref 101–111)
Creatinine, Ser: 7.77 mg/dL — ABNORMAL HIGH (ref 0.61–1.24)
GFR calc Af Amer: 7 mL/min — ABNORMAL LOW (ref >60)
GFR, EST NON AFRICAN AMERICAN: 6 mL/min — AB (ref >60)
Glucose, Bld: 70 mg/dL (ref 65–99)
Potassium: 5.8 mmol/L — ABNORMAL HIGH (ref 3.5–5.1)
Sodium: 133 mmol/L — ABNORMAL LOW (ref 135–145)
Total Protein: 6.4 g/dL — ABNORMAL LOW (ref 6.5–8.1)

## 2016-04-26 LAB — CBC
HEMATOCRIT: 37.4 % — AB (ref 40.0–52.0)
HEMOGLOBIN: 12.3 g/dL — AB (ref 13.0–18.0)
MCH: 33.4 pg (ref 26.0–34.0)
MCHC: 33 g/dL (ref 32.0–36.0)
MCV: 101.1 fL — ABNORMAL HIGH (ref 80.0–100.0)
Platelets: 118 10*3/uL — ABNORMAL LOW (ref 150–440)
RBC: 3.7 MIL/uL — ABNORMAL LOW (ref 4.40–5.90)
RDW: 15.4 % — AB (ref 11.5–14.5)
WBC: 2.9 10*3/uL — AB (ref 3.8–10.6)

## 2016-04-26 MED ORDER — HEPARIN SODIUM (PORCINE) 5000 UNIT/ML IJ SOLN
5000.0000 [IU] | Freq: Two times a day (BID) | INTRAMUSCULAR | Status: DC
  Administered 2016-04-26 – 2016-04-30 (×7): 5000 [IU] via SUBCUTANEOUS
  Filled 2016-04-26 (×8): qty 1

## 2016-04-26 MED ORDER — SODIUM CHLORIDE 0.9% FLUSH
3.0000 mL | Freq: Two times a day (BID) | INTRAVENOUS | Status: DC
  Administered 2016-04-27 – 2016-04-30 (×8): 3 mL via INTRAVENOUS

## 2016-04-26 MED ORDER — NEPRO/CARBSTEADY PO LIQD
237.0000 mL | Freq: Every day | ORAL | Status: DC
  Administered 2016-04-27 – 2016-04-29 (×3): 237 mL via ORAL

## 2016-04-26 MED ORDER — PHENYTOIN SODIUM EXTENDED 100 MG PO CAPS
100.0000 mg | ORAL_CAPSULE | ORAL | Status: DC
  Administered 2016-04-27 – 2016-05-01 (×5): 100 mg via ORAL
  Filled 2016-04-26 (×6): qty 1

## 2016-04-26 MED ORDER — SERTRALINE HCL 50 MG PO TABS
50.0000 mg | ORAL_TABLET | Freq: Every day | ORAL | Status: DC
  Administered 2016-04-27 – 2016-04-30 (×4): 50 mg via ORAL
  Filled 2016-04-26 (×4): qty 1

## 2016-04-26 MED ORDER — METOLAZONE 2.5 MG PO TABS
2.5000 mg | ORAL_TABLET | Freq: Every day | ORAL | Status: DC
  Administered 2016-04-27: 2.5 mg via ORAL
  Filled 2016-04-26 (×5): qty 1

## 2016-04-26 MED ORDER — LEVOTHYROXINE SODIUM 200 MCG PO TABS
600.0000 ug | ORAL_TABLET | Freq: Every day | ORAL | Status: DC
  Administered 2016-04-27 – 2016-04-30 (×4): 600 ug via ORAL
  Filled 2016-04-26 (×3): qty 3

## 2016-04-26 MED ORDER — PHENYTOIN SODIUM EXTENDED 100 MG PO CAPS
300.0000 mg | ORAL_CAPSULE | Freq: Every day | ORAL | Status: DC
  Administered 2016-04-26 – 2016-04-30 (×5): 300 mg via ORAL
  Filled 2016-04-26 (×5): qty 3

## 2016-04-26 MED ORDER — ONDANSETRON HCL 4 MG PO TABS: 4.0000 mg | ORAL_TABLET | Freq: Four times a day (QID) | ORAL | Status: DC | PRN

## 2016-04-26 MED ORDER — PANTOPRAZOLE SODIUM 40 MG PO TBEC
40.0000 mg | DELAYED_RELEASE_TABLET | Freq: Every day | ORAL | Status: DC
  Administered 2016-04-27 – 2016-04-30 (×4): 40 mg via ORAL
  Filled 2016-04-26 (×4): qty 1

## 2016-04-26 MED ORDER — CINACALCET HCL 30 MG PO TABS
90.0000 mg | ORAL_TABLET | Freq: Every day | ORAL | Status: DC
  Administered 2016-04-27 – 2016-04-29 (×3): 90 mg via ORAL
  Filled 2016-04-26 (×4): qty 3

## 2016-04-26 MED ORDER — ASPIRIN EC 81 MG PO TBEC
81.0000 mg | DELAYED_RELEASE_TABLET | Freq: Every day | ORAL | Status: DC
  Administered 2016-04-27 – 2016-04-30 (×4): 81 mg via ORAL
  Filled 2016-04-26 (×4): qty 1

## 2016-04-26 MED ORDER — HALOPERIDOL 5 MG PO TABS
5.0000 mg | ORAL_TABLET | ORAL | Status: DC
  Administered 2016-04-28: 5 mg via ORAL
  Filled 2016-04-26 (×3): qty 1

## 2016-04-26 MED ORDER — OMEGA-3-ACID ETHYL ESTERS 1 G PO CAPS
1.0000 g | ORAL_CAPSULE | Freq: Two times a day (BID) | ORAL | Status: DC
  Administered 2016-04-26 – 2016-04-30 (×8): 1 g via ORAL
  Filled 2016-04-26 (×8): qty 1

## 2016-04-26 MED ORDER — FOLIC ACID 1 MG PO TABS
500.0000 ug | ORAL_TABLET | Freq: Every day | ORAL | Status: DC
  Administered 2016-04-27 – 2016-04-30 (×4): 0.5 mg via ORAL
  Filled 2016-04-26 (×6): qty 1

## 2016-04-26 MED ORDER — ONDANSETRON HCL 4 MG/2ML IJ SOLN: 4.0000 mg | Freq: Four times a day (QID) | INTRAMUSCULAR | Status: DC | PRN

## 2016-04-26 MED ORDER — HEPARIN SOD (PORK) LOCK FLUSH 10 UNIT/ML IV SOLN
INTRAVENOUS | Status: AC
  Filled 2016-04-26: qty 3

## 2016-04-26 MED ORDER — BENZTROPINE MESYLATE 0.5 MG PO TABS
1.0000 mg | ORAL_TABLET | Freq: Two times a day (BID) | ORAL | Status: DC
  Administered 2016-04-26 – 2016-04-30 (×8): 1 mg via ORAL
  Filled 2016-04-26 (×8): qty 2

## 2016-04-26 MED ORDER — LIDOCAINE-EPINEPHRINE (PF) 1 %-1:200000 IJ SOLN
INTRAMUSCULAR | Status: AC
  Filled 2016-04-26: qty 30

## 2016-04-26 MED ORDER — HEPARIN SOD (PORK) LOCK FLUSH 10 UNIT/ML IV SOLN
30.0000 [IU] | Freq: Once | INTRAVENOUS | Status: AC
  Administered 2016-04-26: 30 [IU] via INTRAPERITONEAL

## 2016-04-26 MED ORDER — LACTULOSE 10 GM/15ML PO SOLN
20.0000 g | Freq: Once | ORAL | Status: AC
  Administered 2016-04-26: 20 g via ORAL
  Filled 2016-04-26: qty 30

## 2016-04-26 MED ORDER — CALCIUM ACETATE (PHOS BINDER) 667 MG PO CAPS
1334.0000 mg | ORAL_CAPSULE | Freq: Three times a day (TID) | ORAL | Status: DC
  Administered 2016-04-27 – 2016-05-01 (×10): 1334 mg via ORAL
  Filled 2016-04-26 (×9): qty 2

## 2016-04-26 MED ORDER — QUETIAPINE FUMARATE ER 300 MG PO TB24
300.0000 mg | ORAL_TABLET | Freq: Two times a day (BID) | ORAL | Status: DC
  Administered 2016-04-26 – 2016-04-30 (×8): 300 mg via ORAL
  Filled 2016-04-26 (×11): qty 1

## 2016-04-26 MED ORDER — RENA-VITE PO TABS
1.0000 | ORAL_TABLET | Freq: Every day | ORAL | Status: DC
  Administered 2016-04-27 – 2016-04-30 (×4): 1 via ORAL
  Filled 2016-04-26 (×4): qty 1

## 2016-04-26 MED ORDER — NEPRO PO LIQD: 240.0000 mL | Freq: Every day | ORAL | Status: DC

## 2016-04-26 MED ORDER — SODIUM POLYSTYRENE SULFONATE 15 GM/60ML PO SUSP
30.0000 g | Freq: Once | ORAL | Status: AC
  Administered 2016-04-26: 30 g via ORAL
  Filled 2016-04-26: qty 120

## 2016-04-26 MED ORDER — CLONAZEPAM 0.5 MG PO TABS: 0.5000 mg | ORAL_TABLET | Freq: Two times a day (BID) | ORAL | Status: DC

## 2016-04-26 MED ORDER — MORPHINE SULFATE (PF) 4 MG/ML IV SOLN
INTRAVENOUS | Status: AC
  Filled 2016-04-26: qty 1

## 2016-04-26 MED ORDER — CLONAZEPAM 0.5 MG PO TABS
0.5000 mg | ORAL_TABLET | Freq: Two times a day (BID) | ORAL | Status: DC
  Administered 2016-04-26 – 2016-04-30 (×8): 0.5 mg via ORAL
  Filled 2016-04-26 (×8): qty 1

## 2016-04-26 MED ORDER — OMEPRAZOLE MAGNESIUM 20 MG PO TBEC: 20.0000 mg | DELAYED_RELEASE_TABLET | Freq: Every day | ORAL | Status: DC

## 2016-04-26 MED ORDER — FERROUS FUMARATE 324 (106 FE) MG PO TABS
1.0000 | ORAL_TABLET | Freq: Every day | ORAL | Status: DC
  Administered 2016-04-27 – 2016-04-30 (×4): 106 mg via ORAL
  Filled 2016-04-26 (×5): qty 1

## 2016-04-26 MED ORDER — OXCARBAZEPINE 300 MG PO TABS
600.0000 mg | ORAL_TABLET | Freq: Two times a day (BID) | ORAL | Status: DC
  Administered 2016-04-26 – 2016-04-30 (×8): 600 mg via ORAL
  Filled 2016-04-26 (×10): qty 2

## 2016-04-26 MED ORDER — MORPHINE SULFATE (PF) 2 MG/ML IV SOLN
2.0000 mg | Freq: Once | INTRAVENOUS | Status: AC
  Administered 2016-04-26: 2 mg via INTRAVENOUS

## 2016-04-26 NOTE — Progress Notes (Signed)
This note also relates to the following rows which could not be included: Resp - Cannot attach notes to unvalidated device data   Hemodialysis completed.

## 2016-04-26 NOTE — Progress Notes (Signed)
Hemodialysis start 

## 2016-04-26 NOTE — Progress Notes (Signed)
Post hd tx 

## 2016-04-26 NOTE — Progress Notes (Signed)
Pre-hd tx 

## 2016-04-26 NOTE — H&P (Addendum)
[facility] - [place] at [facility]   PATIENT NAME: [name] Wiley    MR#:  [id number]  DATE OF BIRTH:  1951/09/18  DATE OF ADMISSION:  04/26/2016  PRIMARY CARE PHYSICIAN: [facility]   REQUESTING/REFERRING PHYSICIAN:   CHIEF COMPLAINT:   Chief Complaint  Patient presents with  . Vascular Access Problem    HISTORY OF PRESENT ILLNESS: [name] Wiley  is a 65 y.o. male with a known history of Present admission to the hospital in May 2017 for AV fistula occlusion, requiring temporary hemanalysis catheter placement and declotting of AV fistula, history of end-stage renal disease, on dialysis, hypertension, intellectual disability, seizures, coronary artery disease, hypothyroidism, who presents to the hospital with complaints of AV fistula occlusion, inability to perform hemodialysis. Patient otherwise denies any significant discomfort. No pain, no shortness of breath.  PAST MEDICAL HISTORY:   Past Medical History  Diagnosis Date  . Dialysis patient (HCC)   . Hypertension   . Intellectual disability   . Seizures (HCC)   . GERD (gastroesophageal reflux disease)   . Renal disorder   . Hypothyroid   . CAD (coronary artery disease)   . Anxiety     PAST SURGICAL HISTORY: Past Surgical History  Procedure Laterality Date  . Insert / replace / remove pacemaker    . Av fistula placement Right   . Fistulogram Right 03/06/2015    Procedure: Fistulogram;  Surgeon: Renford Dills, MD;  Location: [facility] INVASIVE CV LAB;  Service: Cardiovascular;  Laterality: Right;    SOCIAL HISTORY:  Social History  Substance Use Topics  . Smoking status: Never Smoker   . Smokeless tobacco: Never Used  . Alcohol Use: No    FAMILY HISTORY:  Family History  Problem Relation Age of Onset  . Congestive Heart Failure Father     DRUG ALLERGIES: No Known Allergies  Review of Systems  Unable to perform ROS: dementia  Constitutional: Negative for fever, chills, weight  loss and malaise/fatigue.  HENT: Negative for congestion.   Eyes: Negative for blurred vision and double vision.  Respiratory: Negative for cough, sputum production, shortness of breath and wheezing.   Cardiovascular: Negative for chest pain, palpitations, orthopnea, leg swelling and PND.  Gastrointestinal: Negative for nausea, vomiting, abdominal pain, diarrhea, constipation, blood in stool and melena.  Genitourinary: Negative for dysuria, urgency, frequency and hematuria.  Musculoskeletal: Negative for falls.  Skin: Negative for rash.  Neurological: Negative for dizziness and weakness.  Psychiatric/Behavioral: Negative for depression and memory loss. The patient is not nervous/anxious.     MEDICATIONS AT HOME:  Prior to Admission medications   Medication Sig Start Date End Date Taking? Authorizing Provider  aspirin EC 81 MG tablet Take 81 mg by mouth daily.   Yes Historical Provider, MD  benztropine (COGENTIN) 1 MG tablet Take 1 mg by mouth 2 (two) times daily.   Yes Historical Provider, MD  calcium acetate (PHOSLO) 667 MG capsule Take 1,334 mg by mouth 3 (three) times daily with meals.   Yes Historical Provider, MD  cholecalciferol (VITAMIN D) 1000 units tablet Take 1,000 Units by mouth daily.   Yes Historical Provider, MD  cinacalcet (SENSIPAR) 30 MG tablet Take 90 mg by mouth daily. Take with lunch.   Yes Historical Provider, MD  clonazePAM (KLONOPIN) 0.5 MG tablet Take 0.5 mg by mouth 2 (two) times daily. *hold for sedation*   Yes Historical Provider, MD  Ferrous Fumarate 324 (106 FE) MG TABS Take 1 tablet by mouth daily.  Yes Historical Provider, MD  folic acid (FOLVITE) 400 MCG tablet Take 400 mcg by mouth daily.    Yes Historical Provider, MD  haloperidol (HALDOL) 5 MG tablet Take 5 mg by mouth 3 (three) times a week. Pt takes on Tuesday, Thursday, and Saturday.   Yes Historical Provider, MD  levothyroxine (SYNTHROID, LEVOTHROID) 300 MCG tablet Take 600 mcg by mouth daily before  breakfast.   Yes Historical Provider, MD  metolazone (ZAROXOLYN) 2.5 MG tablet Take 2.5 mg by mouth daily.   Yes Historical Provider, MD  multivitamin (RENA-VIT) TABS tablet Take 1 tablet by mouth daily.   Yes Historical Provider, MD  Nutritional Supplements (NEPRO) LIQD Take 240 mLs by mouth daily.   Yes Historical Provider, MD  omega-3 acid ethyl esters (LOVAZA) 1 G capsule Take 1 g by mouth 2 (two) times daily.   Yes Historical Provider, MD  omeprazole (PRILOSEC OTC) 20 MG tablet Take 20 mg by mouth daily.   Yes Historical Provider, MD  oxcarbazepine (TRILEPTAL) 600 MG tablet Take 600 mg by mouth 2 (two) times daily.   Yes Historical Provider, MD  phenytoin (DILANTIN) 100 MG ER capsule Take 100-300 mg by mouth See admin instructions. Take 1 capsule by mouth at 6 am, and take 3 capsules (300 mg) by mouth every night at bedtime at 8 pm.   Yes Historical Provider, MD  QUEtiapine (SEROQUEL XR) 300 MG 24 hr tablet Take 300 mg by mouth 2 (two) times daily. At 0600 and 1700.   Yes Historical Provider, MD  sertraline (ZOLOFT) 50 MG tablet Take 50 mg by mouth daily.   Yes Historical Provider, MD  oxyCODONE-acetaminophen (PERCOCET/ROXICET) 5-325 MG per tablet Take 1 tablet by mouth every 6 (six) hours as needed for moderate pain or severe pain. 03/06/15   Houston SirenVivek J Sainani, MD      PHYSICAL EXAMINATION:   VITAL SIGNS: Blood pressure 98/68, pulse 74, temperature 97.9 F (36.6 C), temperature source Oral, resp. rate 17, height 5\' 6"  (1.676 m), weight 81.194 kg (179 lb), SpO2 100 %.  GENERAL:  65 y.o.-year-old patient lying in the bed with no acute distress. Speech is slurred. The patient is edentulous.  EYES: Pupils equal, round, reactive to light and accommodation. No scleral icterus. Extraocular muscles intact.  HEENT: Head atraumatic, normocephalic. Oropharynx and nasopharynx clear.  NECK:  Supple, no jugular venous distention. No thyroid enlargement, no tenderness.  LUNGS: Normal breath sounds  bilaterally, no wheezing, rales,rhonchi or crepitation. No use of accessory muscles of respiration.  CARDIOVASCULAR: S1, S2 normal. No murmurs, rubs, or gallops.  ABDOMEN: Soft, nontender, nondistended. Bowel sounds present. No organomegaly or mass.  EXTREMITIES: 1-2+ lower extremity and pedal edema,  No cyanosis, or clubbing. Right upper extremity AV fistula has no bruit or thrill NEUROLOGIC: Cranial nerves II through XII are intact. Muscle strength 5/5 in all extremities. Sensation intact. Gait not checked.  PSYCHIATRIC: The patient is alert and oriented x 3.  SKIN: No obvious rash, lesion, or ulcer.   LABORATORY PANEL:   CBC  Recent Labs Lab 04/26/16 1018  WBC 2.9*  HGB 12.3*  HCT 37.4*  PLT 118*  MCV 101.1*  MCH 33.4  MCHC 33.0  RDW 15.4*   ------------------------------------------------------------------------------------------------------------------  Chemistries   Recent Labs Lab 04/26/16 1018  NA 133*  K 5.8*  CL 96*  CO2 23  GLUCOSE 70  BUN 65*  CREATININE 7.77*  CALCIUM 8.5*  AST 28  ALT 15*  ALKPHOS 195*  BILITOT 0.7   ------------------------------------------------------------------------------------------------------------------  Cardiac Enzymes No results for input(s): TROPONINI in the last 168 hours. ------------------------------------------------------------------------------------------------------------------  RADIOLOGY: No results found.  EKG: Orders placed or performed during the hospital encounter of 04/26/16  . ED EKG  . ED EKG  EKG in the emergency room revealed sinus rhythm at 73 bpm, normal axis, no acute ST-T changes somewhat peaked T waves in V1, V2  IMPRESSION AND PLAN:  Active Problems:   AV fistula thrombosis (HCC)   Hyperkalemia   Hyponatremia   Pancytopenia (HCC)   End stage renal disease (HCC)  #1. AV fistula thrombosis, admit patient to medical floor, vascular surgery consultation is obtained for temporary  dialysis catheter placement, likely hemodialysis later today after dialysis catheter placement, vascular surgery is to declot AV fistula in the next 1-2 days #2. Hyperkalemia, given Kayexalate, follow potassium level later today, dialysis as possible #3. Hyponatremia in the fluid overloaded patient, follow with dialysis #4. Pancytopenia of unclear etiology, questionable consumption due to thrombosis, follow patient's CBC in the morning #5. End-stage renal disease, dialysis fistula is clotted, obtaining temporary dialysis catheter, AV fistula declotting per vascular surgery schedule, continue outpatient medications, get nephrologist involved for further recommendations #6. History of seizures, continue outpatient medications  All the records are reviewed and case discussed with ED provider. Management plans discussed with the patient, family and they are in agreement.  CODE STATUS: Code Status History    Date Active Date Inactive Code Status Order ID Comments User Context   03/03/2015  6:43 PM 03/06/2015  7:33 PM Full Code [id number]  Enid Baasadhika Kalisetti, MD Inpatient    Advance Directive Documentation        Most Recent Value   Type of Advance Directive  Healthcare Power of Attorney   Pre-existing out of facility DNR order (yellow form or pink MOST form)     "MOST" Form in Place?         TOTAL TIME TAKING CARE OF THIS PATIENT: 50 minutes.    Katharina CaperVAICKUTE,Rachelanne Whidby M.D on 04/26/2016 at 12:48 PM  Between 7am to 6pm - Pager - [phone] After 6pm go to [email or web address] - password [email or web address] [email or web address]  [facility]  Office  [phone]  CC: Primary care physician; [facility]

## 2016-04-26 NOTE — Op Note (Signed)
    Patient name: [name] Wiley MRN: [id number] DOB: 08/13/1951 Sex: male   Pre-operative Diagnosis: End-stage renal disease Post-operative diagnosis:  Same Surgeon:  Durene CalBrabham, Wells Procedure:   Ultrasound-guided placement of a right femoral temporary dialysis catheter Anesthesia:  Morphine Blood Loss:  See anesthesia record Specimens:  None  Findings:  Easily compressible right femoral vein  Indications:  The patient presented to the emergency department with a failed right upper arm dialysis graft.  Surfaces were not available for thrombus lysis, and therefore the decision was made to proceed with a temporary catheter.  Procedure:  The procedure was done and room 7 of the ER.  Informed consent was obtained from the patient 2 mg of morphine were administered.  The right groin was prepped and draped in the usual sterile fashion.  Ultrasound was used to evaluate the right common femoral vein which was widely patent and easily compressible.  1% lidocaine was used for local anesthesia.  Under ultrasound guidance, right femoral vein was cannulated with an 18-gauge needle.  A 035 wire was advanced without resistance.  Subcutaneous tract was dilated with sequential dilators.  A 30 cm dual-lumen dialysis catheter was then inserted.  Both ports flushed and aspirated without difficulty.  Catheter was sutured in place.  Sterile dressings were applied.  There were no immediate complications   Disposition:  To PACU in stable condition.   Juleen ChinaV. Wells Brabham, M.D. [facility] Office: [phone] Pager:  [phone]

## 2016-04-26 NOTE — Progress Notes (Signed)
Subjective:   Patient presented with clotted AVF Vascular lab is closed for the holiday weekend, therefore patient will be admitted and dialyzed via temp dialysis cathter K is high at 5.8  Objective:  Vital signs in last 24 hours:  Temp:  [97.9 F (36.6 C)] 97.9 F (36.6 C) (07/01 0942) Pulse Rate:  [70-75] 75 (07/01 1445) Resp:  [14-18] 18 (07/01 1445) BP: (94-119)/(63-87) 119/66 mmHg (07/01 1445) SpO2:  [95 %-100 %] 95 % (07/01 1445) Weight:  [81.194 kg (179 lb)] 81.194 kg (179 lb) (07/01 0942)  Weight change:  Filed Weights   04/26/16 0942  Weight: 81.194 kg (179 lb)    Intake/Output:   No intake or output data in the 24 hours ending 04/26/16 1550   Physical Exam: General: NAD, laying in bed  HEENT Anicteric, moist mucus membranes  Neck supple  Pulm/lungs Clear, breathing comfortably on room air  CVS/Heart Regular, no rub, + s4 gallop  Abdomen:  Soft, NT  Extremities: Trace peripheral edema  Neurologic: Alert, able to answer questions, pressured speech (Baseline)  Skin: No acute rashes  Access: Temp dialysis cathter, AVG clotted       Basic Metabolic Panel:   Recent Labs Lab 04/26/16 1018  NA 133*  K 5.8*  CL 96*  CO2 23  GLUCOSE 70  BUN 65*  CREATININE 7.77*  CALCIUM 8.5*     CBC:  Recent Labs Lab 04/26/16 1018  WBC 2.9*  HGB 12.3*  HCT 37.4*  MCV 101.1*  PLT 118*      Microbiology:  No results found for this or any previous visit (from the past 720 hour(s)).  Coagulation Studies: No results for input(s): LABPROT, INR in the last 72 hours.  Urinalysis: No results for input(s): COLORURINE, LABSPEC, PHURINE, GLUCOSEU, HGBUR, BILIRUBINUR, KETONESUR, PROTEINUR, UROBILINOGEN, NITRITE, LEUKOCYTESUR in the last 72 hours.  Invalid input(s): APPERANCEUR    Imaging: No results found.   Medications:     . aspirin EC  81 mg Oral Daily  . benztropine  1 mg Oral BID  . calcium acetate  1,334 mg Oral TID WC  . [START ON 04/27/2016]  cinacalcet  90 mg Oral QPC breakfast  . clonazePAM  0.5 mg Oral BID  . feeding supplement (NEPRO CARB STEADY)  237 mL Oral Daily  . Ferrous Fumarate  1 tablet Oral Daily  . folic acid  500 mcg Oral Daily  . [START ON 04/28/2016] haloperidol  5 mg Oral Once per day on Mon Wed Fri  . heparin flush      . heparin  5,000 Units Subcutaneous Q12H  . [START ON 04/27/2016] levothyroxine  600 mcg Oral QAC breakfast  . metolazone  2.5 mg Oral Daily  . morphine      . multivitamin  1 tablet Oral Daily  . omega-3 acid ethyl esters  1 g Oral BID  . oxcarbazepine  600 mg Oral BID  . pantoprazole  40 mg Oral Daily  . [START ON 04/27/2016] phenytoin  100 mg Oral BH-q7a  . phenytoin  300 mg Oral QHS  . QUEtiapine  300 mg Oral BID  . sertraline  50 mg Oral Daily  . sodium chloride flush  3 mL Intravenous Q12H   ondansetron **OR** ondansetron (ZOFRAN) IV  Assessment/ Plan:  65 y.o. male with Bipolar Didorder, hypertension, Anemia, SHPTH  Admitted for clotted dialysis access and hyperkalemia  [facility] [address]   1. End Stage Renal Disease: with hyperkalemia.  - urgent HD  today - AVG declot after the holiday weekend  2. Anemia of chronic kidney disease:  - Hold epo  - restart once Hbg <11  3. Secondary Hyperparathyroidism: - Calcium acetate with meals.    LOS: 0 [name] Wiley 7/1/20173:50 PM

## 2016-04-26 NOTE — ED Provider Notes (Signed)
[facility] Emergency Department Provider Note   ____________________________________________  Time seen: Approximately 11am I have reviewed the triage vital signs and the triage nursing note.  HISTORY  Chief Complaint Vascular Access Problem   Historian Limited as patient is a poor historian  Report per EMS and the patient   HPI [name] Wiley is a 65 y.o. male with right arm AV brachial/axillary graft that has been clotted in the past presents from dialysis today with clotted dialysis access.  His last dialysis was on Thursday. He typically dialyzes on Tuesdays Thursdays and Saturdays.  Denies chest pain or trouble breathing. Denies weakness or numbness. Symptoms are mild. No pain in the arm.    Past Medical History  Diagnosis Date  . Dialysis patient (HCC)   . Hypertension   . Intellectual disability   . Seizures (HCC)   . GERD (gastroesophageal reflux disease)   . Renal disorder   . Hypothyroid   . CAD (coronary artery disease)   . Anxiety     Patient Active Problem List   Diagnosis Date Noted  . 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 Hyperkalemia 04/26/2016  . Hyponatremia 04/26/2016  . Pancytopenia (HCC) 04/26/2016  . End stage renal disease (HCC) 04/26/2016  . 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 Insert / replace / remove pacemaker    . Av fistula placement Right   . Fistulogram Right 03/06/2015    Procedure: Fistulogram;  Surgeon: Renford Dills, MD;  Location: [facility] INVASIVE CV LAB;  Service: Cardiovascular;  Laterality: Right;    Current Outpatient Rx  Name  Route  Sig  Dispense  Refill  . aspirin EC 81 MG tablet   Oral   Take 81 mg by mouth daily.         . benztropine (COGENTIN) 1 MG tablet   Oral   Take 1 mg by mouth 2 (two) times daily.         . calcium acetate (PHOSLO) 667 MG capsule   Oral   Take 1,334 mg by mouth 3 (three) times daily with  meals.         . cholecalciferol (VITAMIN D) 1000 units tablet   Oral   Take 1,000 Units by mouth daily.         . cinacalcet (SENSIPAR) 30 MG tablet   Oral   Take 90 mg by mouth daily. Take with lunch.         . clonazePAM (KLONOPIN) 0.5 MG tablet   Oral   Take 0.5 mg by mouth 2 (two) times daily. *hold for sedation*         . Ferrous Fumarate 324 (106 FE) MG TABS   Oral   Take 1 tablet by mouth daily.         . folic acid (FOLVITE) 400 MCG tablet   Oral   Take 400 mcg by mouth daily.          . haloperidol (HALDOL) 5 MG tablet   Oral   Take 5 mg by mouth 3 (three) times a week. Pt takes on Tuesday, Thursday, and Saturday.         . levothyroxine (SYNTHROID, LEVOTHROID) 300 MCG tablet   Oral   Take 600 mcg by mouth daily before breakfast.         . metolazone (ZAROXOLYN) 2.5 MG tablet   Oral   Take 2.5 mg by mouth daily.         Marland Kitchen  multivitamin (RENA-VIT) TABS tablet   Oral   Take 1 tablet by mouth daily.         . Nutritional Supplements (NEPRO) LIQD   Oral   Take 240 mLs by mouth daily.         Marland Kitchen. omega-3 acid ethyl esters (LOVAZA) 1 G capsule   Oral   Take 1 g by mouth 2 (two) times daily.         Marland Kitchen. omeprazole (PRILOSEC OTC) 20 MG tablet   Oral   Take 20 mg by mouth daily.         Marland Kitchen. oxcarbazepine (TRILEPTAL) 600 MG tablet   Oral   Take 600 mg by mouth 2 (two) times daily.         . phenytoin (DILANTIN) 100 MG ER capsule   Oral   Take 100-300 mg by mouth See admin instructions. Take 1 capsule by mouth at 6 am, and take 3 capsules (300 mg) by mouth every night at bedtime at 8 pm.         . QUEtiapine (SEROQUEL XR) 300 MG 24 hr tablet   Oral   Take 300 mg by mouth 2 (two) times daily. At 0600 and 1700.         . sertraline (ZOLOFT) 50 MG tablet   Oral   Take 50 mg by mouth daily.         Marland Kitchen. oxyCODONE-acetaminophen (PERCOCET/ROXICET) 5-325 MG per tablet   Oral   Take 1 tablet by mouth every 6 (six) hours as needed for  moderate pain or severe pain.   30 tablet   0     Allergies Review of patient's allergies indicates no known allergies.  Family History  Problem Relation Age of Onset  . Congestive Heart Failure Father     Social History Social History  Substance Use Topics  . Smoking status: Never Smoker   . Smokeless tobacco: Never Used  . Alcohol Use: No    Review of Systems  Constitutional: Negative for fever. Eyes: Negative for visual changes. ENT: Negative for sore throat. Cardiovascular: Negative for chest pain. Respiratory: Negative for shortness of breath. Gastrointestinal: Negative for abdominal pain, vomiting and diarrhea. Genitourinary: Negative for dysuria. Musculoskeletal: Negative for back pain. Skin: Negative for rash. Neurological: Negative for headache. 10 point Review of Systems otherwise negative ____________________________________________   PHYSICAL EXAM:  VITAL SIGNS: ED Triage Vitals  Enc Vitals Group     BP 04/26/16 0942 103/87 mmHg     Pulse Rate 04/26/16 0942 70     Resp 04/26/16 0942 14     Temp 04/26/16 0942 97.9 F (36.6 C)     Temp Source 04/26/16 0942 Oral     SpO2 04/26/16 0942 98 %     Weight 04/26/16 0942 179 lb (81.194 kg)     Height 04/26/16 0942 5\' 6"  (1.676 m)     Head Cir --      [facility] --      Pain Score --      Pain Loc --      Pain Edu? --      Excl. in GC? --      Constitutional: Alert and Cooperative. Well appearing and in no distress. HEENT   Head: Normocephalic and atraumatic.      Eyes: Conjunctivae are normal. PERRL. Normal extraocular movements.      Ears:         Nose: No congestion/rhinnorhea.   Mouth/Throat: Mucous membranes are  moist.   Neck: No stridor. Cardiovascular/Chest: Normal rate, regular rhythm.  No murmurs, rubs, or gallops. Respiratory: Normal respiratory effort without tachypnea nor retractions. Breath sounds are clear and equal bilaterally. No wheezes/rales/rhonchi. Gastrointestinal:  Soft. No distention, no guarding, no rebound. Nontender.    Genitourinary/rectal:Deferred Musculoskeletal: Right upper arm graft palpable without any thrill, nontender and no acute swelling. Neurologic:  Resting tremor. Poor historian, some words are difficult to understand. No facial droop. Equal strength in 4 extremities. No sensory deficits. Skin:  Skin is warm, dry and intact. No rash noted. Psychiatric: No hallucinations  ____________________________________________   EKG I, Governor Rooksebecca Jarett Dralle, MD, the attending physician have personally viewed and interpreted all ECGs.  73 bpm. Normal sinus rhythm. Nonspecific intraventricular conduction delay. Left axis deviation. Nonspecific ST and T-wave ____________________________________________  LABS (pertinent positives/negatives)  Labs Reviewed  COMPREHENSIVE METABOLIC PANEL - Abnormal; Notable for the following:    Sodium 133 (*)    Potassium 5.8 (*)    Chloride 96 (*)    BUN 65 (*)    Creatinine, Ser 7.77 (*)    Calcium 8.5 (*)    Total Protein 6.4 (*)    Albumin 3.1 (*)    ALT 15 (*)    Alkaline Phosphatase 195 (*)    GFR calc non Af Amer 6 (*)    GFR calc Af Amer 7 (*)    All other components within normal limits  CBC - Abnormal; Notable for the following:    WBC 2.9 (*)    RBC 3.70 (*)    Hemoglobin 12.3 (*)    HCT 37.4 (*)    MCV 101.1 (*)    RDW 15.4 (*)    Platelets 118 (*)    All other components within normal limits    ____________________________________________  RADIOLOGY All Xrays were viewed by me. Imaging interpreted by Radiologist.  None __________________________________________  PROCEDURES  Procedure(s) performed: None  Critical Care performed: None  ____________________________________________   ED COURSE / ASSESSMENT AND PLAN  Pertinent labs & imaging results that were available during my care of the patient were reviewed by me and considered in my medical decision making (see chart for  details).   Patient is dialysis access of right upper arm brachial axillary graft reportedly clotted and not working this morning at dialysis. His A studies indicate elevated BUN and creatinine with elevated potassium of 5.8. No EKG changes, so not sure he needs additional medications for the hyperkalemia at this point time, but he will need inpatient vascular access and inpatient dialysis.I did give him some lactulose.  I spoke with the vascular surgeon who indicated that there is no staffing to declot the graft from now until next Wednesday. Options would be to transfer the patient to [facility] where he could get his graft declotted versus temporary catheter her.  I spoke with Dr. Thedore MinsSingh, with nephrology and he recommended transfer for definitive declot procedure rather than utilizing additional vascular access when declotting will not be available until after Wednesday at the minimum.    CONSULTATIONS: [place] on-call  Vascular surgeon, Dr.Brabham. [place] Nephrologist Dr. Thedore MinsSingh.  [facility] vascular surgeon reported that nephrology takes care of vascular graft occlusions. I spoke with the on-call [facility] nephrologist who stated that they do not do declotting on the weekends. They recommended temporary catheter placement and dialysis here.  I spoke again with our vascular surgeon who will place a temporary catheter. I spoke with Dr. Thedore MinsSingh to arrange dialysis today and hospitalist Dr.  Vaikute for hospitalist admission.   Patient / Family / Caregiver informed of clinical course, medical decision-making process, and agree with plan.   ___________________________________________   FINAL CLINICAL IMPRESSION(S) / ED DIAGNOSES   Final diagnoses:  AV graft malfunction, initial encounter (HCC)  Acute on chronic renal failure (HCC)  Hyperkalemia              Note: This dictation was prepared with Dragon dictation. Any transcriptional errors that result from this process are  unintentional   Governor Rooks, MD 04/26/16 1338

## 2016-04-26 NOTE — ED Notes (Addendum)
Pt came to ED via EMS from dialysis. Pts last dialysis treatment was Thursday. Per dialysis, pt fistula clogged and unable to receive dialysis treatment. Pt denies pain. Pt from [facility].

## 2016-04-27 LAB — BASIC METABOLIC PANEL
Anion gap: 8 (ref 5–15)
BUN: 30 mg/dL — AB (ref 6–20)
CALCIUM: 8.6 mg/dL — AB (ref 8.9–10.3)
CHLORIDE: 103 mmol/L (ref 101–111)
CO2: 31 mmol/L (ref 22–32)
CREATININE: 4.84 mg/dL — AB (ref 0.61–1.24)
GFR calc Af Amer: 13 mL/min — ABNORMAL LOW (ref >60)
GFR calc non Af Amer: 11 mL/min — ABNORMAL LOW (ref >60)
Glucose, Bld: 85 mg/dL (ref 65–99)
Potassium: 4.1 mmol/L (ref 3.5–5.1)
Sodium: 142 mmol/L (ref 135–145)

## 2016-04-27 LAB — HEPATITIS B SURFACE ANTIGEN: HEP B S AG: NEGATIVE

## 2016-04-27 LAB — CBC
HCT: 34.9 % — ABNORMAL LOW (ref 40.0–52.0)
Hemoglobin: 11.5 g/dL — ABNORMAL LOW (ref 13.0–18.0)
MCH: 32.8 pg (ref 26.0–34.0)
MCHC: 32.9 g/dL (ref 32.0–36.0)
MCV: 99.9 fL (ref 80.0–100.0)
PLATELETS: 114 10*3/uL — AB (ref 150–440)
RBC: 3.49 MIL/uL — ABNORMAL LOW (ref 4.40–5.90)
RDW: 15.4 % — AB (ref 11.5–14.5)
WBC: 3.5 10*3/uL — ABNORMAL LOW (ref 3.8–10.6)

## 2016-04-27 LAB — MRSA PCR SCREENING: MRSA by PCR: NEGATIVE

## 2016-04-27 LAB — HEPATITIS B SURFACE ANTIBODY, QUANTITATIVE: Hepatitis B-Post: 49.9 m[IU]/mL (ref >9.9)

## 2016-04-27 MED ORDER — LABETALOL HCL 5 MG/ML IV SOLN
10.0000 mg | INTRAVENOUS | Status: DC | PRN
  Filled 2016-04-27: qty 4

## 2016-04-27 NOTE — Plan of Care (Signed)
Problem: Education: Goal: Knowledge of [facility] Education information/materials will improve Outcome: Not Progressing Intellectual impairment  Problem: Health Behavior/Discharge Planning: Goal: Ability to manage health-related needs will improve Outcome: Not Progressing Pt requires assistance

## 2016-04-27 NOTE — Progress Notes (Signed)
Subjective:   Patient presented with clotted AV access Vascular lab is closed for the holiday weekend, therefore patient was admitted and dialyzed via temp dialysis cathter Initial K was high at 5.8 Corrected after dialysis  Objective:  Vital signs in last 24 hours:  Temp:  [97.7 F (36.5 C)-98.2 F (36.8 C)] 98.2 F (36.8 C) (07/02 0616) Pulse Rate:  [69-78] 69 (07/02 0712) Resp:  [14-26] 16 (07/02 0616) BP: (77-149)/(41-106) 77/41 mmHg (07/02 0712) SpO2:  [89 %-100 %] 97 % (07/02 0643) Weight:  [79 kg (174 lb 2.6 oz)-81.194 kg (179 lb)] 79 kg (174 lb 2.6 oz) (07/01 1930)  Weight change:  Filed Weights   04/26/16 0942 04/26/16 1540 04/26/16 1930  Weight: 81.194 kg (179 lb) 81.194 kg (179 lb) 79 kg (174 lb 2.6 oz)    Intake/Output:    Intake/Output Summary (Last 24 hours) at 04/27/16 1028 Last data filed at 04/27/16 0900  Gross per 24 hour  Intake    520 ml  Output   2000 ml  Net  -1480 ml     Physical Exam: General: NAD, laying in bed  HEENT Anicteric, moist mucus membranes  Neck supple  Pulm/lungs Clear, breathing comfortably on room air  CVS/Heart Regular, no rub, + s4 gallop  Abdomen:  Soft, NT  Extremities: Trace peripheral edema  Neurologic: Alert, able to answer questions, pressured speech (Baseline)  Skin: No acute rashes  Access: Temp dialysis cathter, AVG clotted       Basic Metabolic Panel:   Recent Labs Lab 04/26/16 1018 04/27/16 0536  NA 133* 142  K 5.8* 4.1  CL 96* 103  CO2 23 31  GLUCOSE 70 85  BUN 65* 30*  CREATININE 7.77* 4.84*  CALCIUM 8.5* 8.6*     CBC:  Recent Labs Lab 04/26/16 1018 04/27/16 0536  WBC 2.9* 3.5*  HGB 12.3* 11.5*  HCT 37.4* 34.9*  MCV 101.1* 99.9  PLT 118* 114*      Microbiology:  Recent Results (from the past 720 hour(s))  MRSA PCR Screening     Status: Abnormal   Collection Time: 04/26/16  8:40 PM  Result Value Ref Range Status   MRSA by PCR (A) NEGATIVE Final    INVALID, UNABLE TO  DETERMINE THE PRESENCE OF TARGET DNA DUE TO SPECIMEN INTEGRITY. RECOLLECTION REQUESTED.    Comment: CALLED DAWN SONGSTER AT 0022 ON 04/27/16  TO RECOLLECT AND REORDER RWW  MRSA PCR Screening     Status: Abnormal   Collection Time: 04/27/16  3:00 AM  Result Value Ref Range Status   MRSA by PCR (A) NEGATIVE Final    INVALID, UNABLE TO DETERMINE THE PRESENCE OF TARGET DNA DUE TO SPECIMEN INTEGRITY. RECOLLECTION REQUESTED.    Comment:        The GeneXpert MRSA Assay (FDA approved for NASAL specimens only), is one component of a comprehensive MRSA colonization surveillance program. It is not intended to diagnose MRSA infection nor to guide or monitor treatment for MRSA infections. REQUEST FOR RECOLLECT CALLED TO MICHAEL JACOBS AT 0734 ON 04/27/16.Marland Kitchen.Marland Kitchen.[facility]     Coagulation Studies: No results for input(s): LABPROT, INR in the last 72 hours.  Urinalysis: No results for input(s): COLORURINE, LABSPEC, PHURINE, GLUCOSEU, HGBUR, BILIRUBINUR, KETONESUR, PROTEINUR, UROBILINOGEN, NITRITE, LEUKOCYTESUR in the last 72 hours.  Invalid input(s): APPERANCEUR    Imaging: No results found.   Medications:     . aspirin EC  81 mg Oral Daily  . benztropine  1 mg Oral BID  . calcium  acetate  1,334 mg Oral TID WC  . cinacalcet  90 mg Oral QPC breakfast  . clonazePAM  0.5 mg Oral BID  . feeding supplement (NEPRO CARB STEADY)  237 mL Oral Daily  . Ferrous Fumarate  1 tablet Oral Daily  . folic acid  500 mcg Oral Daily  . [START ON 04/28/2016] haloperidol  5 mg Oral Once per day on Mon Wed Fri  . heparin  5,000 Units Subcutaneous Q12H  . levothyroxine  600 mcg Oral QAC breakfast  . metolazone  2.5 mg Oral Daily  . multivitamin  1 tablet Oral Daily  . omega-3 acid ethyl esters  1 g Oral BID  . oxcarbazepine  600 mg Oral BID  . pantoprazole  40 mg Oral Daily  . phenytoin  100 mg Oral BH-q7a  . phenytoin  300 mg Oral QHS  . QUEtiapine  300 mg Oral BID  . sertraline  50 mg Oral Daily  . sodium  chloride flush  3 mL Intravenous Q12H   ondansetron **OR** ondansetron (ZOFRAN) IV  Assessment/ Plan:  65 y.o. male with Bipolar Didorder, hypertension, Anemia, SHPTH  Admitted for clotted dialysis access and hyperkalemia  [facility] TTS [facility] [address] [address]   1. End Stage Renal Disease: with hyperkalemia.  - HD T/T/S - AVG declot after the holiday weekend. - Last HD was done on saturday  2. Anemia of chronic kidney disease:  - Hold epo  - restart once Hbg <11  3. Secondary Hyperparathyroidism: - Calcium acetate with meals.    LOS: 1 [name] Wiley 7/2/201710:28 AM

## 2016-04-27 NOTE — Progress Notes (Signed)
LCSW met with patient Brother and he reported that his brother has bipolar disorder and became toxic when he was on lithium which damaged his kidneys many years ago.Family is in agreance to have patient return to [facility] when discharged.

## 2016-04-27 NOTE — Care Management Note (Addendum)
Case Management Note  Patient Details  Name: [name] Wiley Lahue MRN: [id number] Date of Birth: 10/31/1950  Subjective/Objective:        64yo Mr [name] Wiley Mccaster was [facility] with a clotted A-V Fistula. ESRD and [facility] hemod[facility] [address]. K=5.8 upon admission, received hemodialysis after placement of a temporary shunt, K=4.1 on 04/27/16. [facility] is following for placement. No case management needs anticipated.              Action/Plan:   Expected Discharge Date:                  Expected Discharge Plan:     In-House Referral:     Discharge planning Services     Post Acute Care Choice:    Choice offered to:     DME Arranged:    DME Agency:     HH Arranged:    HH Agency:     Status of Service:     If discussed at [employer] Length of Stay Meetings, dates discussed:    Additional Comments:  Akilah Cureton A, RN 04/27/2016, 10:36 AM

## 2016-04-27 NOTE — [place] FL2 (Signed)
[place] MEDICAID FL2 LEVEL OF CARE SCREENING TOOL     IDENTIFICATION  Patient Name: [name] Wiley Birthdate: 09/11/1951 Sex: male Admission Date (Current Location): 04/26/2016  [place]   Facility and Address:  [facility], [address], [place], [place] [address]      Provider Number: [id number]  Attending Physician Name and Address:  Enedina FinnerSona Patel, MD  Relative Name and Phone Number:       Current Level of Care: Hospital Recommended Level of Care: Skilled Nursing Facility Prior Approval Number:    Date Approved/Denied:   PASRR Number:  [id number]  Discharge Plan: SNF    Current Diagnoses: Patient Active Problem List   Diagnosis Date Noted  . 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 Hyperkalemia 04/26/2016  . Hyponatremia 04/26/2016  . Pancytopenia (HCC) 04/26/2016  . End stage renal disease (HCC) 04/26/2016  . 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    Orientation RESPIRATION BLADDER Height & Weight     Self, Time, Situation, Place  Normal Continent (May not urinate-hemo dialysis) Weight: 174 lb 2.6 oz (79 kg) Height:  5\' 6"  (167.6 cm)  BEHAVIORAL SYMPTOMS/MOOD NEUROLOGICAL BOWEL NUTRITION STATUS      Continent Diet (Renal)  AMBULATORY STATUS COMMUNICATION OF NEEDS Skin   Limited Assist Verbally Normal                       Personal Care Assistance Level of Assistance  Bathing, Feeding, Dressing, Total care Bathing Assistance: Limited assistance Feeding assistance: Independent Dressing Assistance: Limited assistance Total Care Assistance: Limited assistance   Functional Limitations Info  Sight, Hearing, Speech Sight Info: Adequate Hearing Info: Adequate Speech Info: Impaired    SPECIAL CARE FACTORS FREQUENCY   (Hemo-dialysis Tues/Thur/Sat [facility] [address] 3x week)                    Contractures      Additional Factors Info  Code Status Code Status Info: Full             Current Medications (04/27/2016):  This is the current hospital active medication list Current Facility-Administered Medications  Medication Dose Route Frequency Provider Last Rate Last Dose  . aspirin EC tablet 81 mg  81 mg Oral Daily Katharina Caperima Vaickute, MD   81 mg at 04/26/16 1545  . benztropine (COGENTIN) tablet 1 mg  1 mg Oral BID Katharina Caperima Vaickute, MD   1 mg at 04/26/16 2105  . calcium acetate (PHOSLO) capsule 1,334 mg  1,334 mg Oral TID WC Katharina Caperima Vaickute, MD   1,334 mg at 04/26/16 1543  . cinacalcet (SENSIPAR) tablet 90 mg  90 mg Oral QPC breakfast Katharina Caperima Vaickute, MD      . clonazePAM Scarlette Calico(KLONOPIN) tablet 0.5 mg  0.5 mg Oral BID Katharina Caperima Vaickute, MD   0.5 mg at 04/26/16 2106  . feeding supplement (NEPRO CARB STEADY) liquid 237 mL  237 mL Oral Daily Katharina Caperima Vaickute, MD   237 mL at 04/26/16 1546  . Ferrous Fumarate (HEMOCYTE - 106 mg FE) tablet 106 mg of iron  1 tablet Oral Daily Katharina Caperima Vaickute, MD   106 mg of iron at 04/26/16 1546  . folic acid (FOLVITE) tablet 0.5 mg  500 mcg Oral Daily Katharina Caperima Vaickute, MD   0.5 mg at 04/26/16 1546  . [START ON 04/28/2016] haloperidol (HALDOL) tablet 5 mg  5 mg Oral Once per day on Mon Wed Fri Katharina Caperima Vaickute, MD      .  heparin injection 5,000 Units  5,000 Units Subcutaneous Q12H Katharina Caperima Vaickute, MD   5,000 Units at 04/26/16 2106  . levothyroxine (SYNTHROID, LEVOTHROID) tablet 600 mcg  600 mcg Oral QAC breakfast Katharina Caperima Vaickute, MD      . metolazone (ZAROXOLYN) tablet 2.5 mg  2.5 mg Oral Daily Katharina Caperima Vaickute, MD   2.5 mg at 04/26/16 1546  . multivitamin (RENA-VIT) tablet 1 tablet  1 tablet Oral Daily Katharina Caperima Vaickute, MD   1 tablet at 04/26/16 1547  . omega-3 acid ethyl esters (LOVAZA) capsule 1 g  1 g Oral BID Katharina Caperima Vaickute, MD   1 g at 04/26/16 2107  . ondansetron (ZOFRAN) tablet 4 mg  4 mg Oral Q6H PRN Katharina Caperima Vaickute, MD       Or  . ondansetron (ZOFRAN) injection 4 mg  4 mg Intravenous Q6H PRN Katharina Caperima Vaickute, MD      . Oxcarbazepine (TRILEPTAL) tablet 600 mg  600 mg Oral BID Katharina Caperima  Vaickute, MD   600 mg at 04/26/16 2106  . pantoprazole (PROTONIX) EC tablet 40 mg  40 mg Oral Daily Katharina Caperima Vaickute, MD   40 mg at 04/26/16 1547  . phenytoin (DILANTIN) ER capsule 100 mg  100 mg Oral Reita ChardBH-q7a Rima Vaickute, MD   100 mg at 04/27/16 0625  . phenytoin (DILANTIN) ER capsule 300 mg  300 mg Oral QHS Katharina Caperima Vaickute, MD   300 mg at 04/26/16 2106  . QUEtiapine (SEROQUEL XR) 24 hr tablet 300 mg  300 mg Oral BID Katharina Caperima Vaickute, MD   300 mg at 04/26/16 2107  . sertraline (ZOLOFT) tablet 50 mg  50 mg Oral Daily Katharina Caperima Vaickute, MD   50 mg at 04/26/16 1548  . sodium chloride flush (NS) 0.9 % injection 3 mL  3 mL Intravenous Q12H Katharina Caperima Vaickute, MD   3 mL at 04/26/16 1548     Discharge Medications: Please see discharge summary for a list of discharge medications.  Relevant Imaging Results:  Relevant Lab Results:   Additional Information SSN [id number]  Cheron SchaumannBandi, Claudine M, [place]

## 2016-04-27 NOTE — Progress Notes (Signed)
Pt. BP elevated in the 130' diastolic, MD notified and orders placed.

## 2016-04-27 NOTE — Clinical Social Work Note (Addendum)
Clinical Social Work Assessment  Patient Details  Name: [name] Wiley MRN: [id number] Date of Birth: [birth year]  Date of referral:  04/27/16               Reason for consult:  Facility Placement                Permission sought to share information with:    Permission granted to share information::  Yes, Verbal Permission Granted  Name::   Kayden Hutmacher [phone]  Agency::  [facility]  Relationship::     Contact Information:  [facility]  Housing/Transportation Living arrangements for the past 2 months:  [place] of Information:  Patient Patient Interpreter Needed:  None (patient has marbled speech) Criminal Activity/Legal Involvement Pertinent to Current Situation/Hospitalization:  No - Comment as needed Significant Relationships: Sibling Lives with:  Facility Resident Do you feel safe going back to the place where you live?  Yes Need for family participation in patient care:  No (Coment)  Care giving concerns:  None mentioned by patient   Social Worker assessment / plan: LCSW met with patient and introduced myself and patient agreed to participate in assessment. He gave verbal consent to contact [facility] and reported he lived their for 6 years. Patient is oriented to person, place and situation, time however has  Pressured marbled speech, denies psychiatric issues.Patient reports he uses a walker and receives assistance with showering. He reports he is independent. He is at end stage renal disease and has an arm catheter for hemo-dyialysis and attends [facility] on Calvin, and Saturdays. He stated his arm got a clot. He has a temporary right femor catheter for hemo dialysis. Patient has medicaid/medicare insurance and will return to [facility] when discharged. Good family support Brother Winferd Humphrey [phone] who is is health care power of attorney. Patient in addition has a bipolar diagnosis and sees a psychiatrist  at his facility.  Employment status:  Disabled (Comment on whether or not currently receiving Disability) (End stage Renal) Insurance information:  Medicare, Medicaid In [place] PT Recommendations:  [place] / Referral to community resources:  [place]  Patient/Family's Response to care:  No concerns at this time  Patient/Family's Understanding of and Emotional Response to Diagnosis, Current Treatment, and Prognosis: patient was able to explain that he had a clot and now has a catheter in his leg and this is how he gets hemo-dialysis here. Despite speech issues patient is oriented x3  Emotional Assessment Appearance:  Appears stated age Attitude/Demeanor/Rapport:   (Pleasant, calm) Affect (typically observed):  Calm, Pleasant Orientation:  Oriented to Self, Oriented to Place, Oriented to  Time, Oriented to Situation Alcohol / Substance use:  Never Used Psych involvement (Current and /or in the community):  No (Comment)  Discharge Needs  Concerns to be addressed:  No discharge needs identified Readmission within the last 30 days:   no Current discharge risk:  None Barriers to Discharge:  Continued Medical Work up   [place], [place], LCSW 04/27/2016, 9:11 AM

## 2016-04-27 NOTE — Progress Notes (Signed)
BP 78/44. MD paged. No new orders given. Will continue to monitor.

## 2016-04-27 NOTE — Progress Notes (Addendum)
Patient ID: [name] Wiley, male   DOB: 12/30/1950, 65 y.o.   MRN: [id number] [facility] [place] [facility]   PATIENT NAME: [name] Wiley    MR#:  [id number]  DATE OF BIRTH:  07/09/1951  SUBJECTIVE:  Came in after having issues with HD catheter and found to have elevated K  REVIEW OF SYSTEMS:   Review of Systems  Constitutional: Negative for fever, chills and weight loss.  HENT: Negative for ear discharge, ear pain and nosebleeds.   Eyes: Negative for blurred vision, pain and discharge.  Respiratory: Negative for sputum production, shortness of breath, wheezing and stridor.   Cardiovascular: Negative for chest pain, palpitations, orthopnea and PND.  Gastrointestinal: Negative for nausea, vomiting, abdominal pain and diarrhea.  Genitourinary: Negative for urgency and frequency.  Musculoskeletal: Negative for back pain and joint pain.  Neurological: Negative for sensory change, speech change, focal weakness and weakness.  Psychiatric/Behavioral: Negative for depression and hallucinations. The patient is not nervous/anxious.    Tolerating Diet:yes Tolerating PT: not needed  DRUG ALLERGIES:  No Known Allergies  VITALS:  Blood pressure 77/41, pulse 69, temperature 98.2 F (36.8 C), temperature source Oral, resp. rate 16, height 5\' 6"  (1.676 m), weight 79 kg (174 lb 2.6 oz), SpO2 97 %.  PHYSICAL EXAMINATION:   Physical Exam  GENERAL:  65 y.o.-year-old patient lying in the bed with no acute distress.  EYES: Pupils equal, round, reactive to light and accommodation. No scleral icterus. Extraocular muscles intact.  HEENT: Head atraumatic, normocephalic. Oropharynx and nasopharynx clear.  NECK:  Supple, no jugular venous distention. No thyroid enlargement, no tenderness.  LUNGS: Normal breath sounds bilaterally, no wheezing, rales, rhonchi. No use of accessory muscles of respiration.  CARDIOVASCULAR: S1, S2 normal. No murmurs, rubs, or gallops.  ABDOMEN:  Soft, nontender, nondistended. Bowel sounds present. No organomegaly or mass.  EXTREMITIES: No cyanosis, clubbing or edema b/l.   HD temp cath NEUROLOGIC: Cranial nerves II through XII are intact. No focal Motor or sensory deficits b/l.   PSYCHIATRIC:  patient is alert and oriented x 3.  SKIN: No obvious rash, lesion, or ulcer.   LABORATORY PANEL:  CBC  Recent Labs Lab 04/27/16 0536  WBC 3.5*  HGB 11.5*  HCT 34.9*  PLT 114*    Chemistries   Recent Labs Lab 04/26/16 1018 04/27/16 0536  NA 133* 142  K 5.8* 4.1  CL 96* 103  CO2 23 31  GLUCOSE 70 85  BUN 65* 30*  CREATININE 7.77* 4.84*  CALCIUM 8.5* 8.6*  AST 28  --   ALT 15*  --   ALKPHOS 195*  --   BILITOT 0.7  --     ASSESSMENT AND PLAN:  [name] Wiley is a 65 y.o. male with a known history of Present admission to the hospital in May 2017 for AV fistula occlusion, requiring temporary hemanalysis catheter placement and declotting of AV fistula, history of end-stage renal disease, on dialysis, hypertension, intellectual disability, seizures, coronary artery disease, hypothyroidism, who presents to the hospital with complaints of AV fistula occlusion, inability to perform hemodialysis. Patient otherwise denies any significant discomfort. No pain, no shortness of breath.  #1. AV fistula thrombosis -s/p Temp HD catheter placement and last HD on Saturday -K normalized -pt will get declotting done in 1-2 days  #2. Hyperkalemia, given Kayexalate, follow potassium level later today, dialysis as possible -normal today  #3. Hyponatremia in the fluid overloaded patient, follow with dialysis  #4. Pancytopenia of unclear etiology, questionable  consumption due to thrombosis  #5. End-stage renal disease, dialysis fistula is clotted, obtaining temporary dialysis catheter, AV fistula declotting per vascular surgery schedule, continue outpatient medications, get nephrologist involved for further recommendations  #6. History of  seizures, continue outpatient medications  Case discussed with Care Management/Social Worker. Management plans discussed with the patient  and they are in agreement.  CODE STATUS: full DVT Prophylaxis: heparin  TOTAL TIME TAKING CARE OF THIS PATIENT: 30 minutes.  >50% time spent on counselling and coordination of care  POSSIBLE D/C IN *1-2 DAYS, DEPENDING ON CLINICAL CONDITION.  Note: This dictation was prepared with Dragon dictation along with smaller phrase technology. Any transcriptional errors that result from this process are unintentional.  Alphonsus Doyel M.D on 04/27/2016 at 11:11 AM  Between 7am to 6pm - Pager - [phone]  After 6pm go to [email or web address] - password [email or web address] [facility]  [facility]  Office  [phone]  CC: Primary care physician; GENERAL MEDICAL CLINIC

## 2016-04-28 MED ORDER — HALOPERIDOL 5 MG PO TABS
5.0000 mg | ORAL_TABLET | ORAL | Status: DC
  Administered 2016-04-29 – 2016-05-01 (×2): 5 mg via ORAL
  Filled 2016-04-28 (×2): qty 1

## 2016-04-28 MED ORDER — DOCUSATE SODIUM 100 MG PO CAPS
100.0000 mg | ORAL_CAPSULE | Freq: Two times a day (BID) | ORAL | Status: DC
  Administered 2016-04-28 – 2016-04-30 (×5): 100 mg via ORAL
  Filled 2016-04-28 (×5): qty 1

## 2016-04-28 MED ORDER — SENNOSIDES-DOCUSATE SODIUM 8.6-50 MG PO TABS
1.0000 | ORAL_TABLET | Freq: Every day | ORAL | Status: DC
  Administered 2016-04-28 – 2016-04-30 (×3): 1 via ORAL
  Filled 2016-04-28 (×3): qty 1

## 2016-04-28 NOTE — Progress Notes (Signed)
Patient ID: [name] Wiley, male   DOB: 05/15/1951, 65 y.o.   MRN: [id number] [facility] [place] [facility]   PATIENT NAME: [name] Wiley    MR#:  [id number]  DATE OF BIRTH:  10/28/1950  SUBJECTIVE:  Came in after having issues with HD catheter and found to have elevated K Brother in the room. No new complaints  REVIEW OF SYSTEMS:   Review of Systems  Constitutional: Negative for fever, chills and weight loss.  HENT: Negative for ear discharge, ear pain and nosebleeds.   Eyes: Negative for blurred vision, pain and discharge.  Respiratory: Negative for sputum production, shortness of breath, wheezing and stridor.   Cardiovascular: Negative for chest pain, palpitations, orthopnea and PND.  Gastrointestinal: Negative for nausea, vomiting, abdominal pain and diarrhea.  Genitourinary: Negative for urgency and frequency.  Musculoskeletal: Negative for back pain and joint pain.  Neurological: Negative for sensory change, speech change, focal weakness and weakness.  Psychiatric/Behavioral: Negative for depression and hallucinations. The patient is not nervous/anxious.    Tolerating Diet:yes Tolerating PT: not needed  DRUG ALLERGIES:  No Known Allergies  VITALS:  Blood pressure 85/42, pulse 70, temperature 98.5 F (36.9 C), temperature source Oral, resp. rate 17, height 5\' 6"  (1.676 m), weight 79 kg (174 lb 2.6 oz), SpO2 98 %.  PHYSICAL EXAMINATION:   Physical Exam  GENERAL:  65 y.o.-year-old patient lying in the bed with no acute distress.  EYES: Pupils equal, round, reactive to light and accommodation. No scleral icterus. Extraocular muscles intact.  HEENT: Head atraumatic, normocephalic. Oropharynx and nasopharynx clear.  NECK:  Supple, no jugular venous distention. No thyroid enlargement, no tenderness.  LUNGS: Normal breath sounds bilaterally, no wheezing, rales, rhonchi. No use of accessory muscles of respiration.  CARDIOVASCULAR: S1, S2 normal.  No murmurs, rubs, or gallops.  ABDOMEN: Soft, nontender, nondistended. Bowel sounds present. No organomegaly or mass.  EXTREMITIES: No cyanosis, clubbing or edema b/l.   HD temp cath NEUROLOGIC: Cranial nerves II through XII are intact. No focal Motor or sensory deficits b/l.   PSYCHIATRIC:  patient is alert and oriented x 3.  SKIN: No obvious rash, lesion, or ulcer.   LABORATORY PANEL:  CBC  Recent Labs Lab 04/27/16 0536  WBC 3.5*  HGB 11.5*  HCT 34.9*  PLT 114*    Chemistries   Recent Labs Lab 04/26/16 1018 04/27/16 0536  NA 133* 142  K 5.8* 4.1  CL 96* 103  CO2 23 31  GLUCOSE 70 85  BUN 65* 30*  CREATININE 7.77* 4.84*  CALCIUM 8.5* 8.6*  AST 28  --   ALT 15*  --   ALKPHOS 195*  --   BILITOT 0.7  --     ASSESSMENT AND PLAN:  [name] Wiley is a 65 y.o. male with a known history of Present admission to the hospital in May 2017 for AV fistula occlusion, requiring temporary hemanalysis catheter placement and declotting of AV fistula, history of end-stage renal disease, on dialysis, hypertension, intellectual disability, seizures, coronary artery disease, hypothyroidism, who presents to the hospital with complaints of AV fistula occlusion, inability to perform hemodialysis. Patient otherwise denies any significant discomfort. No pain, no shortness of breath.  #1. AV fistula thrombosis -s/p Temp HD catheter placement and last HD on Saturday -K normalized -pt will get declotting done in 1-2 days  #2. Hyperkalemia, given Kayexalate, follow potassium level later today, dialysis as possible -normal today  #3. Hyponatremia in the fluid overloaded patient, follow with dialysis  #  4. Pancytopenia of unclear etiology, questionable consumption due to thrombosis  #5. End-stage renal disease, dialysis fistula is clotted, obtaining temporary dialysis catheter, AV fistula declotting per vascular surgery schedule, continue outpatient medications, get nephrologist involved for  further recommendations  #6. History of seizures, continue outpatient medications  Case discussed with Care Management/Social Worker. Management plans discussed with the patient  and they are in agreement.  CODE STATUS: full DVT Prophylaxis: heparin  TOTAL TIME TAKING CARE OF THIS PATIENT: 30 minutes.  >50% time spent on counselling and coordination of care  POSSIBLE D/C IN *1-2 DAYS, DEPENDING ON CLINICAL CONDITION.  Note: This dictation was prepared with Dragon dictation along with smaller phrase technology. Any transcriptional errors that result from this process are unintentional.  Ruthellen Tippy M.D on 04/28/2016 at 7:02 PM  Between 7am to 6pm - Pager - [phone]  After 6pm go to [email or web address] - password [email or web address] [facility]  [facility]  Office  [phone]  CC: Primary care physician; GENERAL MEDICAL CLINIC

## 2016-04-28 NOTE — Care Management Important Message (Signed)
Important Message  Patient Details  Name: [name] Wiley MRN: [id number] Date of Birth: 01/13/1951   Medicare Important Message Given:  Yes    Eber HongGreene, Coulson Wehner R, RN 04/28/2016, 1:28 PM

## 2016-04-28 NOTE — Progress Notes (Signed)
Plan for de-clot of AVGG on Wednesday   [name] Wiley

## 2016-04-28 NOTE — Progress Notes (Signed)
Subjective:  Patient resting comfortably in bed. Potassium currently down to 4.1. Patient due for dialysis again tomorrow.    Objective:  Vital signs in last 24 hours:  Temp:  [97.2 F (36.2 C)-98.5 F (36.9 C)] 98.5 F (36.9 C) (07/03 1334) Pulse Rate:  [69-113] 70 (07/03 1334) Resp:  [17] 17 (07/03 1334) BP: (84-151)/(42-138) 85/42 mmHg (07/03 1334) SpO2:  [98 %-99 %] 98 % (07/03 1334)  Weight change:  Filed Weights   04/26/16 0942 04/26/16 1540 04/26/16 1930  Weight: 81.194 kg (179 lb) 81.194 kg (179 lb) 79 kg (174 lb 2.6 oz)    Intake/Output:    Intake/Output Summary (Last 24 hours) at 04/28/16 1524 Last data filed at 04/28/16 1300  Gross per 24 hour  Intake    360 ml  Output      0 ml  Net    360 ml     Physical Exam: General: NAD, laying in bed  HEENT Anicteric, moist mucus membranes  Neck supple  Pulm/lungs Clear, breathing comfortably on room air  CVS/Heart Regular, no rub  Abdomen:  Soft, NT  Extremities: Trace peripheral edema  Neurologic: Alert, able to answer questions  Skin: No acute rashes  Access: Temp dialysis cathter, AVG clotted       Basic Metabolic Panel:   Recent Labs Lab 04/26/16 1018 04/27/16 0536  NA 133* 142  K 5.8* 4.1  CL 96* 103  CO2 23 31  GLUCOSE 70 85  BUN 65* 30*  CREATININE 7.77* 4.84*  CALCIUM 8.5* 8.6*     CBC:  Recent Labs Lab 04/26/16 1018 04/27/16 0536  WBC 2.9* 3.5*  HGB 12.3* 11.5*  HCT 37.4* 34.9*  MCV 101.1* 99.9  PLT 118* 114*      Microbiology:  Recent Results (from the past 720 hour(s))  MRSA PCR Screening     Status: Abnormal   Collection Time: 04/26/16  8:40 PM  Result Value Ref Range Status   MRSA by PCR (A) NEGATIVE Final    INVALID, UNABLE TO DETERMINE THE PRESENCE OF TARGET DNA DUE TO SPECIMEN INTEGRITY. RECOLLECTION REQUESTED.    Comment: CALLED DAWN SONGSTER AT 0022 ON 04/27/16  TO RECOLLECT AND REORDER RWW  MRSA PCR Screening     Status: Abnormal   Collection Time:  04/27/16  3:00 AM  Result Value Ref Range Status   MRSA by PCR (A) NEGATIVE Final    INVALID, UNABLE TO DETERMINE THE PRESENCE OF TARGET DNA DUE TO SPECIMEN INTEGRITY. RECOLLECTION REQUESTED.    Comment:        The GeneXpert MRSA Assay (FDA approved for NASAL specimens only), is one component of a comprehensive MRSA colonization surveillance program. It is not intended to diagnose MRSA infection nor to guide or monitor treatment for MRSA infections. REQUEST FOR RECOLLECT CALLED TO MICHAEL JACOBS AT 0734 ON 04/27/16.Marland Kitchen.Marland Kitchen.[facility]   MRSA PCR Screening     Status: Abnormal   Collection Time: 04/27/16  2:20 PM  Result Value Ref Range Status   MRSA by PCR (A) NEGATIVE Final    INVALID, UNABLE TO DETERMINE THE PRESENCE OF TARGET DNA DUE TO SPECIMEN INTEGRITY. RECOLLECTION REQUESTED.    Comment:        The GeneXpert MRSA Assay (FDA approved for NASAL specimens only), is one component of a comprehensive MRSA colonization surveillance program. It is not intended to diagnose MRSA infection nor to guide or monitor treatment for MRSA infections.   MRSA PCR Screening     Status: None  Collection Time: 04/27/16  4:03 PM  Result Value Ref Range Status   MRSA by PCR NEGATIVE NEGATIVE Final    Comment:        The GeneXpert MRSA Assay (FDA approved for NASAL specimens only), is one component of a comprehensive MRSA colonization surveillance program. It is not intended to diagnose MRSA infection nor to guide or monitor treatment for MRSA infections.     Coagulation Studies: No results for input(s): LABPROT, INR in the last 72 hours.  Urinalysis: No results for input(s): COLORURINE, LABSPEC, PHURINE, GLUCOSEU, HGBUR, BILIRUBINUR, KETONESUR, PROTEINUR, UROBILINOGEN, NITRITE, LEUKOCYTESUR in the last 72 hours.  Invalid input(s): APPERANCEUR    Imaging: No results found.   Medications:     . aspirin EC  81 mg Oral Daily  . benztropine  1 mg Oral BID  . calcium acetate  1,334 mg  Oral TID WC  . cinacalcet  90 mg Oral QPC breakfast  . clonazePAM  0.5 mg Oral BID  . docusate sodium  100 mg Oral BID  . feeding supplement (NEPRO CARB STEADY)  237 mL Oral Daily  . Ferrous Fumarate  1 tablet Oral Daily  . folic acid  500 mcg Oral Daily  . [START ON 04/29/2016] haloperidol  5 mg Oral Once per day on Tue Thu Sat  . heparin  5,000 Units Subcutaneous Q12H  . levothyroxine  600 mcg Oral QAC breakfast  . multivitamin  1 tablet Oral Daily  . omega-3 acid ethyl esters  1 g Oral BID  . oxcarbazepine  600 mg Oral BID  . pantoprazole  40 mg Oral Daily  . phenytoin  100 mg Oral BH-q7a  . phenytoin  300 mg Oral QHS  . QUEtiapine  300 mg Oral BID  . senna-docusate  1 tablet Oral Daily  . sertraline  50 mg Oral Daily  . sodium chloride flush  3 mL Intravenous Q12H   labetalol, ondansetron **OR** ondansetron (ZOFRAN) IV  Assessment/ Plan:  65 y.o. male with Bipolar Didorder, hypertension, Anemia, SHPTH  Admitted for clotted dialysis access and hyperkalemia  [facility] [address] [address]   1. End Stage Renal Disease: with hyperkalemia.  - no urgent indication for dialysis at the moment.  We will plan for hemodialysis again tomorrow.  2. Anemia of chronic kidney disease:  - continue to hold Epogen.  Hemoglobin 11.5 yesterday.  3. Secondary Hyperparathyroidism: - check phosphorus with dialysis tomorrow.  For now continue calcium acetate.   LOS: 2 Samantha Ragen 7/3/20173:24 PM

## 2016-04-29 LAB — GLUCOSE, CAPILLARY: GLUCOSE-CAPILLARY: 80 mg/dL (ref 65–99)

## 2016-04-29 MED ORDER — HALOPERIDOL LACTATE 5 MG/ML IJ SOLN
2.0000 mg | Freq: Once | INTRAMUSCULAR | Status: AC
  Administered 2016-04-29: 2 mg via INTRAVENOUS
  Filled 2016-04-29: qty 1

## 2016-04-29 MED ORDER — HALOPERIDOL LACTATE 5 MG/ML IJ SOLN: 5.0000 mg | Freq: Once | INTRAMUSCULAR | Status: DC

## 2016-04-29 NOTE — Progress Notes (Signed)
Post dialysis 

## 2016-04-29 NOTE — Progress Notes (Signed)
Tx complete  

## 2016-04-29 NOTE — Progress Notes (Signed)
Pt anxious and yelling loudly. Family concerned over patients anxiety level and they are requesting he has something "to calm him down". MD notified and one time order of IV Haldol was given. Will administered and monitor closely

## 2016-04-29 NOTE — Progress Notes (Signed)
Pre Dialysis 

## 2016-04-29 NOTE — Progress Notes (Signed)
Subjective:  Pt seen and evaluated during dialysis.   Tolerating well.    Objective:  Vital signs in last 24 hours:  Temp:  [97.9 F (36.6 C)-98.5 F (36.9 C)] 97.9 F (36.6 C) (07/04 0951) Pulse Rate:  [69-80] 73 (07/04 1200) Resp:  [17-29] 24 (07/04 1200) BP: (85-133)/(42-96) 119/96 mmHg (07/04 1200) SpO2:  [91 %-100 %] 96 % (07/04 1200) Weight:  [77.1 kg (169 lb 15.6 oz)] 77.1 kg (169 lb 15.6 oz) (07/04 0951)  Weight change:  Filed Weights   04/26/16 1540 04/26/16 1930 04/29/16 0951  Weight: 81.194 kg (179 lb) 79 kg (174 lb 2.6 oz) 77.1 kg (169 lb 15.6 oz)    Intake/Output:    Intake/Output Summary (Last 24 hours) at 04/29/16 1221 Last data filed at 04/29/16 0800  Gross per 24 hour  Intake    480 ml  Output      0 ml  Net    480 ml     Physical Exam: General: NAD, laying in bed  HEENT Anicteric, moist mucus membranes  Neck supple  Pulm/lungs CTAB normal effort  CVS/Heart Regular, no rub  Abdomen:  Soft, NTND, BS present  Extremities: Trace peripheral edema  Neurologic: Alert, able to answer questions  Skin: No acute rashes  Access: Temp dialysis cathter, AVG clotted       Basic Metabolic Panel:   Recent Labs Lab 04/26/16 1018 04/27/16 0536  NA 133* 142  K 5.8* 4.1  CL 96* 103  CO2 23 31  GLUCOSE 70 85  BUN 65* 30*  CREATININE 7.77* 4.84*  CALCIUM 8.5* 8.6*     CBC:  Recent Labs Lab 04/26/16 1018 04/27/16 0536  WBC 2.9* 3.5*  HGB 12.3* 11.5*  HCT 37.4* 34.9*  MCV 101.1* 99.9  PLT 118* 114*      Microbiology:  Recent Results (from the past 720 hour(s))  MRSA PCR Screening     Status: Abnormal   Collection Time: 04/26/16  8:40 PM  Result Value Ref Range Status   MRSA by PCR (A) NEGATIVE Final    INVALID, UNABLE TO DETERMINE THE PRESENCE OF TARGET DNA DUE TO SPECIMEN INTEGRITY. RECOLLECTION REQUESTED.    Comment: CALLED DAWN SONGSTER AT 0022 ON 04/27/16  TO RECOLLECT AND REORDER RWW  MRSA PCR Screening     Status: Abnormal   Collection Time: 04/27/16  3:00 AM  Result Value Ref Range Status   MRSA by PCR (A) NEGATIVE Final    INVALID, UNABLE TO DETERMINE THE PRESENCE OF TARGET DNA DUE TO SPECIMEN INTEGRITY. RECOLLECTION REQUESTED.    Comment:        The GeneXpert MRSA Assay (FDA approved for NASAL specimens only), is one component of a comprehensive MRSA colonization surveillance program. It is not intended to diagnose MRSA infection nor to guide or monitor treatment for MRSA infections. REQUEST FOR RECOLLECT CALLED TO MICHAEL JACOBS AT 0734 ON 04/27/16.Marland Kitchen.Marland Kitchen.[facility]   MRSA PCR Screening     Status: Abnormal   Collection Time: 04/27/16  2:20 PM  Result Value Ref Range Status   MRSA by PCR (A) NEGATIVE Final    INVALID, UNABLE TO DETERMINE THE PRESENCE OF TARGET DNA DUE TO SPECIMEN INTEGRITY. RECOLLECTION REQUESTED.    Comment:        The GeneXpert MRSA Assay (FDA approved for NASAL specimens only), is one component of a comprehensive MRSA colonization surveillance program. It is not intended to diagnose MRSA infection nor to guide or monitor treatment for MRSA infections.   MRSA  PCR Screening     Status: None   Collection Time: 04/27/16  4:03 PM  Result Value Ref Range Status   MRSA by PCR NEGATIVE NEGATIVE Final    Comment:        The GeneXpert MRSA Assay (FDA approved for NASAL specimens only), is one component of a comprehensive MRSA colonization surveillance program. It is not intended to diagnose MRSA infection nor to guide or monitor treatment for MRSA infections.     Coagulation Studies: No results for input(s): LABPROT, INR in the last 72 hours.  Urinalysis: No results for input(s): COLORURINE, LABSPEC, PHURINE, GLUCOSEU, HGBUR, BILIRUBINUR, KETONESUR, PROTEINUR, UROBILINOGEN, NITRITE, LEUKOCYTESUR in the last 72 hours.  Invalid input(s): APPERANCEUR    Imaging: No results found.   Medications:     . aspirin EC  81 mg Oral Daily  . benztropine  1 mg Oral BID  . calcium  acetate  1,334 mg Oral TID WC  . cinacalcet  90 mg Oral QPC breakfast  . clonazePAM  0.5 mg Oral BID  . docusate sodium  100 mg Oral BID  . feeding supplement (NEPRO CARB STEADY)  237 mL Oral Daily  . Ferrous Fumarate  1 tablet Oral Daily  . folic acid  500 mcg Oral Daily  . haloperidol  5 mg Oral Once per day on Tue Thu Sat  . heparin  5,000 Units Subcutaneous Q12H  . levothyroxine  600 mcg Oral QAC breakfast  . multivitamin  1 tablet Oral Daily  . omega-3 acid ethyl esters  1 g Oral BID  . oxcarbazepine  600 mg Oral BID  . pantoprazole  40 mg Oral Daily  . phenytoin  100 mg Oral BH-q7a  . phenytoin  300 mg Oral QHS  . QUEtiapine  300 mg Oral BID  . senna-docusate  1 tablet Oral Daily  . sertraline  50 mg Oral Daily  . sodium chloride flush  3 mL Intravenous Q12H   labetalol, ondansetron **OR** ondansetron (ZOFRAN) IV  Assessment/ Plan:  65 y.o. male with Bipolar Didorder, hypertension, Anemia, SHPTH, ESRD on HD TTHS, clotted dialysis access  Admitted for clotted dialysis access and hyperkalemia  [facility]   1. End Stage Renal Disease: with hyperkalemia.  - Pt seen and evaluated during HD, tolerating well, declot of access on Wednesday.  Most recent K downt o 4.1.    2. Anemia of chronic kidney disease:  - Epogen remains on hold as most recent hgb was 11.5.   3. Secondary Hyperparathyroidism: - await phos today, continue calcium acetate 2 tabs po tid/wm and sensipar.     LOS: 3 Jaquanda Wickersham 7/4/201712:21 PM

## 2016-04-29 NOTE — Clinical Social Work Note (Signed)
CSW followed up with [facility] regarding pt's return. Per admissions coordinator, pt is able to return when medically stable as he is LTC. CSW will continue to follow.   Dede QuerySarah Marquize Seib, MSW, LCSW  Clinical Social Worker  [phone]

## 2016-04-29 NOTE — Progress Notes (Signed)
Patient ID: [name] Wiley, male   DOB: 05/06/1951, 65 y.o.   MRN: [id number] [facility] [place] [facility]   PATIENT NAME: [name] Wiley    MR#:  [id number]  DATE OF BIRTH:  11/26/1950  SUBJECTIVE:  Came in after having issues with HD catheter and found to have elevated K Brother in the room. No new complaints  REVIEW OF SYSTEMS:   Review of Systems  Constitutional: Negative for fever, chills and weight loss.  HENT: Negative for ear discharge, ear pain and nosebleeds.   Eyes: Negative for blurred vision, pain and discharge.  Respiratory: Negative for sputum production, shortness of breath, wheezing and stridor.   Cardiovascular: Negative for chest pain, palpitations, orthopnea and PND.  Gastrointestinal: Negative for nausea, vomiting, abdominal pain and diarrhea.  Genitourinary: Negative for urgency and frequency.  Musculoskeletal: Negative for back pain and joint pain.  Neurological: Negative for sensory change, speech change, focal weakness and weakness.  Psychiatric/Behavioral: Negative for depression and hallucinations. The patient is not nervous/anxious.    Tolerating Diet:yes Tolerating PT: not needed  DRUG ALLERGIES:  No Known Allergies  VITALS:  Blood pressure 104/88, pulse 74, temperature 98.2 F (36.8 C), temperature source Oral, resp. rate 20, height 5\' 6"  (1.676 m), weight 79 kg (174 lb 2.6 oz), SpO2 99 %.  PHYSICAL EXAMINATION:   Physical Exam  GENERAL:  65 y.o.-year-old patient lying in the bed with no acute distress.  EYES: Pupils equal, round, reactive to light and accommodation. No scleral icterus. Extraocular muscles intact.  HEENT: Head atraumatic, normocephalic. Oropharynx and nasopharynx clear.  NECK:  Supple, no jugular venous distention. No thyroid enlargement, no tenderness.  LUNGS: Normal breath sounds bilaterally, no wheezing, rales, rhonchi. No use of accessory muscles of respiration.  CARDIOVASCULAR: S1, S2 normal.  No murmurs, rubs, or gallops.  ABDOMEN: Soft, nontender, nondistended. Bowel sounds present. No organomegaly or mass.  EXTREMITIES: No cyanosis, clubbing or edema b/l.   HD temp cath NEUROLOGIC: Cranial nerves II through XII are intact. No focal Motor or sensory deficits b/l.   PSYCHIATRIC:  patient is alert and oriented x 3.  SKIN: No obvious rash, lesion, or ulcer.   LABORATORY PANEL:  CBC  Recent Labs Lab 04/27/16 0536  WBC 3.5*  HGB 11.5*  HCT 34.9*  PLT 114*    Chemistries   Recent Labs Lab 04/26/16 1018 04/27/16 0536  NA 133* 142  K 5.8* 4.1  CL 96* 103  CO2 23 31  GLUCOSE 70 85  BUN 65* 30*  CREATININE 7.77* 4.84*  CALCIUM 8.5* 8.6*  AST 28  --   ALT 15*  --   ALKPHOS 195*  --   BILITOT 0.7  --     ASSESSMENT AND PLAN:  [name] Wiley is a 65 y.o. male with a known history of Present admission to the hospital in May 2017 for AV fistula occlusion, requiring temporary hemanalysis catheter placement and declotting of AV fistula, history of end-stage renal disease, on dialysis, hypertension, intellectual disability, seizures, coronary artery disease, hypothyroidism, who presents to the hospital with complaints of AV fistula occlusion, inability to perform hemodialysis. Patient otherwise denies any significant discomfort. No pain, no shortness of breath.  #1. AV fistula thrombosis -s/p Temp HD catheter placement and last HD on Saturday -K normalized -pt will get declotting done on Wednesday  #2. Hyperkalemia, given Kayexalate, follow potassium level later today, dialysis as possible -normal today  #3. Hyponatremia in the fluid overloaded patient - follow with dialysis  #  4.Pancytopenia of unclear etiology, questionable consumption due to thrombosis  #5. End-stage renal disease, dialysis fistula is clotted, obtaining temporary dialysis catheter, AV fistula declotting per vascular surgery schedule, continue outpatient medications  #6. History of seizures,  continue outpatient medications  Case discussed with Care Management/Social Worker. Management plans discussed with the patient  and they are in agreement.  CODE STATUS: full DVT Prophylaxis: heparin  TOTAL TIME TAKING CARE OF THIS PATIENT: 30 minutes.  >50% time spent on counselling and coordination of care  POSSIBLE D/C IN 1-2 DAYS, DEPENDING ON CLINICAL CONDITION.  Note: This dictation was prepared with Dragon dictation along with smaller phrase technology. Any transcriptional errors that result from this process are unintentional.  Natina Wiginton M.D on 04/29/2016 at 7:57 AM  Between 7am to 6pm - Pager - [phone]  After 6pm go to [email or web address] - password [email or web address] [facility]  [facility]  Office  [phone]  CC: Primary care physician; [facility]

## 2016-04-29 NOTE — Progress Notes (Signed)
Pre dialysis  

## 2016-04-29 NOTE — Progress Notes (Signed)
Dialysis started 

## 2016-04-30 LAB — GLUCOSE, CAPILLARY: Glucose-Capillary: 82 mg/dL (ref 65–99)

## 2016-04-30 LAB — PROTIME-INR
INR: 1.27
PROTHROMBIN TIME: 16 s — AB (ref 11.4–15.0)

## 2016-04-30 MED ORDER — LORAZEPAM 2 MG/ML IJ SOLN
INTRAMUSCULAR | Status: AC
  Filled 2016-04-30: qty 1

## 2016-04-30 MED ORDER — CHLORHEXIDINE GLUCONATE CLOTH 2 % EX PADS
6.0000 | MEDICATED_PAD | Freq: Once | CUTANEOUS | Status: AC
  Administered 2016-05-01: 6 via TOPICAL

## 2016-04-30 MED ORDER — LORAZEPAM 2 MG/ML IJ SOLN
1.0000 mg | Freq: Once | INTRAMUSCULAR | Status: AC
  Administered 2016-04-30: 1 mg via INTRAVENOUS
  Filled 2016-04-30: qty 1

## 2016-04-30 MED ORDER — HALOPERIDOL LACTATE 5 MG/ML IJ SOLN
1.0000 mg | Freq: Four times a day (QID) | INTRAMUSCULAR | Status: DC | PRN
  Administered 2016-04-30: 1 mg via INTRAVENOUS
  Filled 2016-04-30 (×2): qty 1

## 2016-04-30 MED ORDER — LORAZEPAM 2 MG/ML IJ SOLN
1.0000 mg | INTRAMUSCULAR | Status: DC | PRN
  Administered 2016-04-30: 1 mg via INTRAVENOUS

## 2016-04-30 NOTE — Progress Notes (Signed)
Patient family, Gardiner Barefootathaniel was updated of procedure time scheduled on Thursday 04/30/16. Patient family expressed concerns on scheduling and requested to speak with upper management, regarding concerns. Notified nurse supervisor, about patient concern. Patient spoke with nurse supervisor about situation. Pt responded well to ativan order. Pt resting in bed continue to assess.

## 2016-04-30 NOTE — Progress Notes (Signed)
Notified Kenton Kingfisherathaniel Barfuss of patient status and possible procedure time. Per patient family request, contacted OR and Vascular to see if patient is to have procedure today. Per Dr. Wyn Quakerew, the plan is to aim for surgery on 05/01/16. Pt resting in bed,  [facility]	[place] updated.

## 2016-04-30 NOTE — Progress Notes (Signed)
Subjective:  Patient had hemodialysis yesterday. Inquiring as to when he can go home today. Declot of his access to be performed tomorrow.    Objective:  Vital signs in last 24 hours:  Temp:  [98.3 F (36.8 C)-98.6 F (37 C)] 98.3 F (36.8 C) (07/05 1229) Pulse Rate:  [70-72] 70 (07/05 1229) Resp:  [18-19] 18 (07/05 1229) BP: (82-122)/(39-62) 89/62 mmHg (07/05 1229) SpO2:  [99 %-100 %] 99 % (07/05 1229)  Weight change:  Filed Weights   04/26/16 1930 04/29/16 0951 04/29/16 1327  Weight: 79 kg (174 lb 2.6 oz) 77.1 kg (169 lb 15.6 oz) 75 kg (165 lb 5.5 oz)    Intake/Output:    Intake/Output Summary (Last 24 hours) at 04/30/16 1605 Last data filed at 04/30/16 1300  Gross per 24 hour  Intake    460 ml  Output      0 ml  Net    460 ml     Physical Exam: General: NAD, laying in bed  HEENT Anicteric, moist mucus membranes  Neck supple  Pulm/lungs CTAB normal effort  CVS/Heart Regular, no rub  Abdomen:  Soft, NTND, BS present  Extremities: Trace peripheral edema  Neurologic: Alert, able to answer questions  Skin: No acute rashes  Access: Temp dialysis cathter, AVG clotted       Basic Metabolic Panel:   Recent Labs Lab 04/26/16 1018 04/27/16 0536  NA 133* 142  K 5.8* 4.1  CL 96* 103  CO2 23 31  GLUCOSE 70 85  BUN 65* 30*  CREATININE 7.77* 4.84*  CALCIUM 8.5* 8.6*     CBC:  Recent Labs Lab 04/26/16 1018 04/27/16 0536  WBC 2.9* 3.5*  HGB 12.3* 11.5*  HCT 37.4* 34.9*  MCV 101.1* 99.9  PLT 118* 114*      Microbiology:  Recent Results (from the past 720 hour(s))  MRSA PCR Screening     Status: Abnormal   Collection Time: 04/26/16  8:40 PM  Result Value Ref Range Status   MRSA by PCR (A) NEGATIVE Final    INVALID, UNABLE TO DETERMINE THE PRESENCE OF TARGET DNA DUE TO SPECIMEN INTEGRITY. RECOLLECTION REQUESTED.    Comment: CALLED DAWN SONGSTER AT 0022 ON 04/27/16  TO RECOLLECT AND REORDER RWW  MRSA PCR Screening     Status: Abnormal   Collection Time: 04/27/16  3:00 AM  Result Value Ref Range Status   MRSA by PCR (A) NEGATIVE Final    INVALID, UNABLE TO DETERMINE THE PRESENCE OF TARGET DNA DUE TO SPECIMEN INTEGRITY. RECOLLECTION REQUESTED.    Comment:        The GeneXpert MRSA Assay (FDA approved for NASAL specimens only), is one component of a comprehensive MRSA colonization surveillance program. It is not intended to diagnose MRSA infection nor to guide or monitor treatment for MRSA infections. REQUEST FOR RECOLLECT CALLED TO MICHAEL JACOBS AT 0734 ON 04/27/16.Marland Kitchen.Marland Kitchen.[facility]   MRSA PCR Screening     Status: Abnormal   Collection Time: 04/27/16  2:20 PM  Result Value Ref Range Status   MRSA by PCR (A) NEGATIVE Final    INVALID, UNABLE TO DETERMINE THE PRESENCE OF TARGET DNA DUE TO SPECIMEN INTEGRITY. RECOLLECTION REQUESTED.    Comment:        The GeneXpert MRSA Assay (FDA approved for NASAL specimens only), is one component of a comprehensive MRSA colonization surveillance program. It is not intended to diagnose MRSA infection nor to guide or monitor treatment for MRSA infections.   MRSA PCR Screening  Status: None   Collection Time: 04/27/16  4:03 PM  Result Value Ref Range Status   MRSA by PCR NEGATIVE NEGATIVE Final    Comment:        The GeneXpert MRSA Assay (FDA approved for NASAL specimens only), is one component of a comprehensive MRSA colonization surveillance program. It is not intended to diagnose MRSA infection nor to guide or monitor treatment for MRSA infections.     Coagulation Studies:  Recent Labs  04/30/16 0346  LABPROT 16.0*  INR 1.27    Urinalysis: No results for input(s): COLORURINE, LABSPEC, PHURINE, GLUCOSEU, HGBUR, BILIRUBINUR, KETONESUR, PROTEINUR, UROBILINOGEN, NITRITE, LEUKOCYTESUR in the last 72 hours.  Invalid input(s): APPERANCEUR    Imaging: No results found.   Medications:     . aspirin EC  81 mg Oral Daily  . benztropine  1 mg Oral BID  . calcium  acetate  1,334 mg Oral TID WC  . Chlorhexidine Gluconate Cloth  6 each Topical Once  . cinacalcet  90 mg Oral QPC breakfast  . clonazePAM  0.5 mg Oral BID  . docusate sodium  100 mg Oral BID  . feeding supplement (NEPRO CARB STEADY)  237 mL Oral Daily  . Ferrous Fumarate  1 tablet Oral Daily  . folic acid  500 mcg Oral Daily  . haloperidol  5 mg Oral Once per day on Tue Thu Sat  . heparin  5,000 Units Subcutaneous Q12H  . levothyroxine  600 mcg Oral QAC breakfast  . multivitamin  1 tablet Oral Daily  . omega-3 acid ethyl esters  1 g Oral BID  . oxcarbazepine  600 mg Oral BID  . pantoprazole  40 mg Oral Daily  . phenytoin  100 mg Oral BH-q7a  . phenytoin  300 mg Oral QHS  . QUEtiapine  300 mg Oral BID  . senna-docusate  1 tablet Oral Daily  . sertraline  50 mg Oral Daily  . sodium chloride flush  3 mL Intravenous Q12H   haloperidol lactate, labetalol, ondansetron **OR** ondansetron (ZOFRAN) IV  Assessment/ Plan:  65 y.o. male with Bipolar Didorder, hypertension, Anemia, SHPTH, ESRD on HD TTHS, clotted dialysis access  Admitted for clotted dialysis access and hyperkalemia  [facility] [address]   1. End Stage Renal Disease: with hyperkalemia.  - Patient completed dialysis yesterday. Next dialysis to be scheduled for tomorrow. Declot to be scheduled for tomorrow as well.    2. Anemia of chronic kidney disease:  - Continue to hold Epogen for now. Monitor hemoglobin.  3. Secondary Hyperparathyroidism: - continue calcium acetate 2 tabs po tid/wm and sensipar.     LOS: 4 Myrtis Maille 7/5/20174:05 PM

## 2016-04-30 NOTE — Progress Notes (Signed)
[facility] [place] [facility]   PATIENT NAME: [name] JewettJames Wiley    MR#:  [id number]  DATE OF BIRTH:  04/13/1951  SUBJECTIVE:  CHIEF COMPLAINT:   Chief Complaint  Patient presents with  . Vascular Access Problem   - Patient has underlying mental illness, has been very irritable, frustrated and agitated as his AV fistula declotting procedure is not being done today - sister-in-law at bedside  REVIEW OF SYSTEMS:  Review of Systems  Unable to perform ROS: psychiatric disorder    DRUG ALLERGIES:  No Known Allergies  VITALS:  Blood pressure 89/62, pulse 70, temperature 98.3 F (36.8 C), temperature source Oral, resp. rate 18, height 5\' 6"  (1.676 m), weight 75 kg (165 lb 5.5 oz), SpO2 99 %.  PHYSICAL EXAMINATION:  Physical Exam  GENERAL:  65 y.o.-year-old patient lying in the bed, very agitated, shouting and irritable.  EYES: Pupils equal, round, reactive to light and accommodation. No scleral icterus. Extraocular muscles intact.  HEENT: Head atraumatic, normocephalic. Oropharynx and nasopharynx clear.  NECK:  Supple, no jugular venous distention. No thyroid enlargement, no tenderness.  LUNGS: Normal breath sounds bilaterally, no wheezing, rales,rhonchi or crepitation. No use of accessory muscles of respiration.  CARDIOVASCULAR: S1, S2 normal. No murmurs, rubs, or gallops.  ABDOMEN: Soft, nontender, nondistended. Bowel sounds present. No organomegaly or mass.  EXTREMITIES: No pedal edema, cyanosis, or clubbing.  NEUROLOGIC: did not cooperate for a complete exam, moving all extremities in bed, speech is not clear PSYCHIATRIC: The patient is alert. Not oriented  SKIN: No obvious rash, lesion, or ulcer.    LABORATORY PANEL:   CBC  Recent Labs Lab 04/27/16 0536  WBC 3.5*  HGB 11.5*  HCT 34.9*  PLT 114*   ------------------------------------------------------------------------------------------------------------------  Chemistries   Recent  Labs Lab 04/26/16 1018 04/27/16 0536  NA 133* 142  K 5.8* 4.1  CL 96* 103  CO2 23 31  GLUCOSE 70 85  BUN 65* 30*  CREATININE 7.77* 4.84*  CALCIUM 8.5* 8.6*  AST 28  --   ALT 15*  --   ALKPHOS 195*  --   BILITOT 0.7  --    ------------------------------------------------------------------------------------------------------------------  Cardiac Enzymes No results for input(s): TROPONINI in the last 168 hours. ------------------------------------------------------------------------------------------------------------------  RADIOLOGY:  No results found.  EKG:   Orders placed or performed during the hospital encounter of 04/26/16  . ED EKG  . ED EKG    ASSESSMENT AND PLAN:   [name] Wiley is a 65 y.o. male with a known history of Intellectual disability, ESRD on HD, HTN presents to the hospital for AV fistula occlusion, requiring temporary hemanalysis catheter placement  #1. AV fistula thrombosis -s/p Temp HD catheter placement and last HD yesterday -K normalized -Appreciate vascular consult. For fistula declotting procedure tomorrow  #2. Hyperkalemia- improved after dialysis.  -normal today  #3. Anxiety and agitation-added risperidone twice a day. Continue Haldol when necessary. Also on Klonopin twice a day. Continue Seroquel, Trileptal and Zoloft  #4.Pancytopenia of unclear etiology, questionable consumption due to thrombosis  #5. End-stage renal disease, dialysis fistula is clotted, has temporary dialysis catheter, AV fistula declotting per vascular surgery schedule- C tomorrow. If not successful, we'll get a permacath. - continue outpatient medications  #6. History of seizures, continue outpatient medications-on phenytoin  #7 Hypothyroidism-on significantly higher doses of Synthroid Check TSH  #8 DVT prophylaxis-on subcutaneous heparin  Physical Therapy when able to. Ambulates with a walker at baseline    All the records are reviewed and  case  discussed with Care Management/Social Workerr. Management plans discussed with the patient, family and they are in agreement.  CODE STATUS: Full Code  TOTAL TIME TAKING CARE OF THIS PATIENT: 37 minutes.   POSSIBLE D/C IN 2 DAYS, DEPENDING ON CLINICAL CONDITION.   Enid BaasKALISETTI,Nohemi Nicklaus M.D on 04/30/2016 at 3:01 PM  Between 7am to 6pm - Pager - [phone]  After 6pm go to [email or web address] - password [email or web address] [facility]  [facility] [facility]  Office  [phone]  CC: Primary care physician; [facility]AL [facility] CLINIC

## 2016-04-30 NOTE — Care Management Important Message (Signed)
Important Message  Patient Details  Name: [name] Wiley MRN: [id number] Date of Birth: 01/22/1951   Medicare Important Message Given:  Yes    Olegario MessierKathy A Kevron Patella 04/30/2016, 2:16 PM

## 2016-04-30 NOTE — Progress Notes (Signed)
[name] Wiley and Vascular Surgery  Daily Progress Note   Subjective  - * No surgery found *  Patient talking to himself incoherently when I entered the room.  Unable to understand what he is saying.  Unable to have a conversation with him.  Objective Filed Vitals:   04/30/16 0100 04/30/16 0330 04/30/16 0450 04/30/16 0621  BP: 88/50 94/47 98/50  122/48  Pulse:    70  Temp:    98.6 F (37 C)  TempSrc:    Oral  Resp:    19  Height:      Weight:      SpO2:    100%    Intake/Output Summary (Last 24 hours) at 04/30/16 1156 Last data filed at 04/30/16 1048  Gross per 24 hour  Intake    460 ml  Output   2000 ml  Net  -1540 ml    PULM  CTAB CV  RRR VASC  No bruit or thrill in access  Laboratory CBC    Component Value Date/Time   WBC 3.5* 04/27/2016 0536   WBC 3.9 02/17/2015 0630   HGB 11.5* 04/27/2016 0536   HGB 8.2* 02/19/2015 0447   HCT 34.9* 04/27/2016 0536   HCT 29.5* 02/17/2015 0630   PLT 114* 04/27/2016 0536   PLT 119* 02/17/2015 0630    BMET    Component Value Date/Time   NA 142 04/27/2016 0536   NA 133* 02/17/2015 0630   K 4.1 04/27/2016 0536   K 5.6* 02/19/2015 0447   CL 103 04/27/2016 0536   CL 98* 02/17/2015 0630   CO2 31 04/27/2016 0536   CO2 19* 02/17/2015 0630   GLUCOSE 85 04/27/2016 0536   GLUCOSE 69 02/17/2015 0630   BUN 30* 04/27/2016 0536   BUN 90* 02/17/2015 0630   CREATININE 4.84* 04/27/2016 0536   CREATININE 10.06* 02/17/2015 0630   CALCIUM 8.6* 04/27/2016 0536   CALCIUM 8.1* 02/17/2015 0630   GFRNONAA 11* 04/27/2016 0536   GFRNONAA 5* 02/17/2015 0630   GFRNONAA 8* 01/08/2012 0440   GFRAA 13* 04/27/2016 0536   GFRAA 6* 02/17/2015 0630   GFRAA 9* 01/08/2012 0440    Assessment/Planning: ESRD, clotted access   No room availability for today to perform declot, even though my schedule would allow  Will add on for tomorrow for declot, permcath if access can not be opened.    [name] Wiley  04/30/2016, 11:56 AM

## 2016-04-30 NOTE — Progress Notes (Signed)
Patient increased in agitation, notified MD received new order for prn medication. Patient responded effectively with Ativan 1mg . Patient did not respond as well with haldol. Pt resting in bed. Continue to assess.

## 2016-05-01 ENCOUNTER — Encounter
Admission: EM | Disposition: A | Discharge status: Skilled Nursing Facility | Payer: Self-pay | Source: Home / Self Care | Attending: Internal Medicine

## 2016-05-01 HISTORY — PX: PERIPHERAL VASCULAR CATHETERIZATION: SHX172C

## 2016-05-01 LAB — BASIC METABOLIC PANEL
ANION GAP: 12 (ref 5–15)
BUN: 74 mg/dL — ABNORMAL HIGH (ref 6–20)
CALCIUM: 8.9 mg/dL (ref 8.9–10.3)
CO2: 29 mmol/L (ref 22–32)
CREATININE: 8.55 mg/dL — AB (ref 0.61–1.24)
Chloride: 97 mmol/L — ABNORMAL LOW (ref 101–111)
GFR, EST AFRICAN AMERICAN: 7 mL/min — AB (ref >60)
GFR, EST NON AFRICAN AMERICAN: 6 mL/min — AB (ref >60)
Glucose, Bld: 84 mg/dL (ref 65–99)
Potassium: 4.8 mmol/L (ref 3.5–5.1)
Sodium: 138 mmol/L (ref 135–145)

## 2016-05-01 LAB — CBC
HCT: 36.7 % — ABNORMAL LOW (ref 40.0–52.0)
HEMOGLOBIN: 12.1 g/dL — AB (ref 13.0–18.0)
MCH: 33.6 pg (ref 26.0–34.0)
MCHC: 32.9 g/dL (ref 32.0–36.0)
MCV: 102.1 fL — ABNORMAL HIGH (ref 80.0–100.0)
PLATELETS: 111 10*3/uL — AB (ref 150–440)
RBC: 3.6 MIL/uL — AB (ref 4.40–5.90)
RDW: 15.1 % — ABNORMAL HIGH (ref 11.5–14.5)
WBC: 2.8 10*3/uL — ABNORMAL LOW (ref 3.8–10.6)

## 2016-05-01 LAB — TSH: TSH: 0.038 u[IU]/mL — AB (ref 0.350–4.500)

## 2016-05-01 LAB — T4, FREE: FREE T4: 0.53 ng/dL — AB (ref 0.61–1.12)

## 2016-05-01 SURGERY — A/V SHUNT INTERVENTION
Anesthesia: Moderate Sedation

## 2016-05-01 MED ORDER — NEPRO/CARBSTEADY PO LIQD: 237.0000 mL | Freq: Every day | ORAL | Status: AC

## 2016-05-01 MED ORDER — FENTANYL CITRATE (PF) 100 MCG/2ML IJ SOLN
INTRAMUSCULAR | Status: DC | PRN
  Administered 2016-05-01 (×2): 50 ug via INTRAVENOUS

## 2016-05-01 MED ORDER — DEXTROSE 5 % IV SOLN
INTRAVENOUS | Status: AC
  Filled 2016-05-01: qty 1.5

## 2016-05-01 MED ORDER — DOCUSATE SODIUM 100 MG PO CAPS: 100.0000 mg | ORAL_CAPSULE | Freq: Two times a day (BID) | ORAL | Status: AC

## 2016-05-01 MED ORDER — ALTEPLASE 2 MG IJ SOLR
INTRAMUSCULAR | Status: AC
  Filled 2016-05-01: qty 4

## 2016-05-01 MED ORDER — HEPARIN (PORCINE) IN NACL 2-0.9 UNIT/ML-% IJ SOLN
INTRAMUSCULAR | Status: AC
  Filled 2016-05-01: qty 1000

## 2016-05-01 MED ORDER — LACTULOSE 10 GM/15ML PO SOLN
20.0000 g | Freq: Two times a day (BID) | ORAL | Status: DC | PRN
  Administered 2016-05-01: 20 g via ORAL
  Filled 2016-05-01: qty 30

## 2016-05-01 MED ORDER — MIDAZOLAM HCL 2 MG/2ML IJ SOLN
INTRAMUSCULAR | Status: AC
  Filled 2016-05-01: qty 4

## 2016-05-01 MED ORDER — DEXTROSE 5 % IV SOLN: 1.5000 g | Freq: Once | INTRAVENOUS | Status: DC

## 2016-05-01 MED ORDER — HALOPERIDOL 5 MG PO TABS: 5.0000 mg | ORAL_TABLET | ORAL | Status: AC

## 2016-05-01 MED ORDER — CLONAZEPAM 0.5 MG PO TABS: 0.5000 mg | ORAL_TABLET | Freq: Two times a day (BID) | ORAL | Status: AC

## 2016-05-01 MED ORDER — FENTANYL CITRATE (PF) 100 MCG/2ML IJ SOLN
INTRAMUSCULAR | Status: AC
  Filled 2016-05-01: qty 2

## 2016-05-01 MED ORDER — HEPARIN SODIUM (PORCINE) 1000 UNIT/ML IJ SOLN
INTRAMUSCULAR | Status: AC
  Filled 2016-05-01: qty 1

## 2016-05-01 MED ORDER — LIDOCAINE-EPINEPHRINE (PF) 1 %-1:200000 IJ SOLN
INTRAMUSCULAR | Status: AC
  Filled 2016-05-01: qty 30

## 2016-05-01 MED ORDER — BISACODYL 10 MG RE SUPP
10.0000 mg | Freq: Every day | RECTAL | Status: DC | PRN
  Administered 2016-05-01: 10 mg via RECTAL
  Filled 2016-05-01: qty 1

## 2016-05-01 MED ORDER — SENNOSIDES-DOCUSATE SODIUM 8.6-50 MG PO TABS: 1.0000 | ORAL_TABLET | Freq: Every day | ORAL | Status: AC

## 2016-05-01 MED ORDER — MIDAZOLAM HCL 2 MG/2ML IJ SOLN
INTRAMUSCULAR | Status: DC | PRN
  Administered 2016-05-01: 2 mg via INTRAVENOUS

## 2016-05-01 MED ORDER — OXYCODONE-ACETAMINOPHEN 5-325 MG PO TABS: 1.0000 | ORAL_TABLET | Freq: Four times a day (QID) | ORAL | Status: AC | PRN

## 2016-05-01 MED ORDER — IOPAMIDOL (ISOVUE-300) INJECTION 61%
INTRAVENOUS | Status: DC | PRN
  Administered 2016-05-01: 30 mL via INTRAVENOUS

## 2016-05-01 MED ORDER — POLYETHYLENE GLYCOL 3350 17 G PO PACK
17.0000 g | PACK | Freq: Every day | ORAL | Status: DC
  Administered 2016-05-01: 17 g via ORAL
  Filled 2016-05-01: qty 1

## 2016-05-01 MED ORDER — CEFUROXIME SODIUM 1.5 G IJ SOLR
1.5000 g | Freq: Three times a day (TID) | INTRAMUSCULAR | Status: DC
  Administered 2016-05-01: 1.5 g via INTRAVENOUS

## 2016-05-01 MED ORDER — SODIUM CHLORIDE 0.9 % IV SOLN
INTRAVENOUS | Status: DC
  Administered 2016-05-01: 10:00:00 via INTRAVENOUS

## 2016-05-01 MED ORDER — HEPARIN SODIUM (PORCINE) 1000 UNIT/ML IJ SOLN
INTRAMUSCULAR | Status: DC | PRN
  Administered 2016-05-01: 3000 [IU] via INTRAVENOUS

## 2016-05-01 MED ORDER — ALTEPLASE 2 MG IJ SOLR
INTRAMUSCULAR | Status: DC | PRN
  Administered 2016-05-01: 4 mg

## 2016-05-01 MED ORDER — OXYCODONE-ACETAMINOPHEN 5-325 MG PO TABS: 1.0000 | ORAL_TABLET | Freq: Four times a day (QID) | ORAL | Status: DC | PRN

## 2016-05-01 SURGICAL SUPPLY — 17 items
BALLN DORADO 10X80X80 (BALLOONS) ×3
BALLN DORADO 7X80X80 (BALLOONS) ×3
BALLOON DORADO 10X80X80 (BALLOONS) ×1 IMPLANT
BALLOON DORADO 7X80X80 (BALLOONS) ×1 IMPLANT
CANISTER PENUMBRA MAX (MISCELLANEOUS) ×3 IMPLANT
CANNULA 5F STIFF (CANNULA) ×3 IMPLANT
CATH EMBOLECTOMY 5FR (BALLOONS) ×3 IMPLANT
CATH INDIGO D 50CM (CATHETERS) ×3 IMPLANT
CATH TORCON 5FR 0.38 (CATHETERS) ×3 IMPLANT
DEVICE PRESTO INFLATION (MISCELLANEOUS) ×6 IMPLANT
DRAPE BRACHIAL (DRAPES) ×3 IMPLANT
PACK ANGIOGRAPHY (CUSTOM PROCEDURE TRAY) ×3 IMPLANT
SHEATH BRITE TIP 6FRX5.5 (SHEATH) ×6 IMPLANT
SHEATH BRITE TIP 8FRX11 (SHEATH) ×3 IMPLANT
SHIELD RADPAD DADD DRAPE 4X9 (MISCELLANEOUS) ×3 IMPLANT
TOWEL OR 17X26 4PK STRL BLUE (TOWEL DISPOSABLE) ×3 IMPLANT
WIRE MAGIC TOR.035 180C (WIRE) ×6 IMPLANT

## 2016-05-01 NOTE — Discharge Summary (Signed)
[facility] Physicians - [place] at [facility]   PATIENT NAME: [name] Wiley    MR#:  [id number]  DATE OF BIRTH:  1951/09/22  DATE OF ADMISSION:  04/26/2016 ADMITTING PHYSICIAN: Katharina Caper, MD  DATE OF DISCHARGE: 05/01/2016  PRIMARY CARE PHYSICIAN: [facility]    ADMISSION DIAGNOSIS:  Hyperkalemia [E87.5] Acute on chronic renal failure (HCC) [N17.9, N18.9] AV graft malfunction, initial encounter (HCC) [T82.510A]  DISCHARGE DIAGNOSIS:  Active Problems:   AV fistula thrombosis (HCC)   Hyperkalemia   Hyponatremia   Pancytopenia (HCC)   End stage renal disease (HCC)   SECONDARY DIAGNOSIS:   Past Medical History  Diagnosis Date  . Dialysis patient (HCC)   . Hypertension   . Intellectual disability   . Seizures (HCC)   . GERD (gastroesophageal reflux disease)   . Renal disorder   . Hypothyroid   . CAD (coronary artery disease)   . Anxiety     HOSPITAL COURSE:   [name] Wiley is a 65 y.o. male with a known history of Intellectual disability, ESRD on HD, HTN presents to the hospital for AV fistula occlusion, requiring temporary hemanalysis catheter placement  #1. AV fistula thrombosis -s/p Temp HD catheter placement, AV fistula declotting done today -K+ normalized -Appreciate vascular consult.   #2. Hyperkalemia- improved after dialysis.  -normal now  #3. Anxiety and agitation, Bipolar diosrder-Continue Haldol when necessary. Also on Klonopin twice a day. Continue Seroquel, Trileptal and Zoloft  #4.Pancytopenia of unclear etiology, questionable consumption due to thrombosis stable  #5. End-stage renal disease, dialysis fistula is clotted, s/p declotting today- discontinue temporary dialysis catheter,- continue outpatient medications - for dialysis today through the declotted fistula and it worked fine  #6. History of seizures, continue outpatient medications-on phenytoin  #7 Hypothyroidism-on significantly higher doses of  Synthroid TSH low- free T4 on the lower side as well- follow-up with PCP as outpatient. No overt symptoms of hypothyroidism Biotin can interfere with free T4 levels. Hold multivitamin as outpatient and repeat T4 in few weeks  #8 constipation-medications added  Physical Therapy. Ambulates with a walker at baseline  DISCHARGE CONDITIONS:   Guarded  CONSULTS OBTAINED:  Treatment Team:  Renford Dills, MD  DRUG ALLERGIES:  No Known Allergies  DISCHARGE MEDICATIONS:   Current Discharge Medication List    START taking these medications   Details  docusate sodium (COLACE) 100 MG capsule Take 1 capsule (100 mg total) by mouth 2 (two) times daily. Qty: 10 capsule, Refills: 0    !! Nutritional Supplements (FEEDING SUPPLEMENT, NEPRO CARB STEADY,) LIQD Take 237 mLs by mouth daily. Qty: 1000 mL, Refills: 0    senna-docusate (SENOKOT-S) 8.6-50 MG tablet Take 1 tablet by mouth daily. Qty: 60 tablet, Refills: 0     !! - Potential duplicate medications found. Please discuss with provider.    CONTINUE these medications which have CHANGED   Details  clonazePAM (KLONOPIN) 0.5 MG tablet Take 1 tablet (0.5 mg total) by mouth 2 (two) times daily. *hold for sedation* Qty: 20 tablet, Refills: 0    haloperidol (HALDOL) 5 MG tablet Take 1 tablet (5 mg total) by mouth 3 (three) times a week. Pt takes on Tuesday, Thursday, and Saturday. Qty: 20 tablet, Refills: 0    oxyCODONE-acetaminophen (PERCOCET/ROXICET) 5-325 MG tablet Take 1 tablet by mouth every 6 (six) hours as needed for moderate pain or severe pain. Qty: 30 tablet, Refills: 0      CONTINUE these medications which have NOT CHANGED   Details  aspirin EC 81 MG tablet Take 81 mg by mouth daily.    benztropine (COGENTIN) 1 MG tablet Take 1 mg by mouth 2 (two) times daily.    calcium acetate (PHOSLO) 667 MG capsule Take 1,334 mg by mouth 3 (three) times daily with meals.    cholecalciferol (VITAMIN D) 1000 units tablet Take 1,000  Units by mouth daily.    cinacalcet (SENSIPAR) 30 MG tablet Take 90 mg by mouth daily. Take with lunch.    Ferrous Fumarate 324 (106 FE) MG TABS Take 1 tablet by mouth daily.    folic acid (FOLVITE) 400 MCG tablet Take 400 mcg by mouth daily.     levothyroxine (SYNTHROID, LEVOTHROID) 300 MCG tablet Take 600 mcg by mouth daily before breakfast.    metolazone (ZAROXOLYN) 2.5 MG tablet Take 2.5 mg by mouth daily.    multivitamin (RENA-VIT) TABS tablet Take 1 tablet by mouth daily.    !! Nutritional Supplements (NEPRO) LIQD Take 240 mLs by mouth daily.    omega-3 acid ethyl esters (LOVAZA) 1 G capsule Take 1 g by mouth 2 (two) times daily.    omeprazole (PRILOSEC OTC) 20 MG tablet Take 20 mg by mouth daily.    oxcarbazepine (TRILEPTAL) 600 MG tablet Take 600 mg by mouth 2 (two) times daily.    phenytoin (DILANTIN) 100 MG ER capsule Take 100-300 mg by mouth See admin instructions. Take 1 capsule by mouth at 6 am, and take 3 capsules (300 mg) by mouth every night at bedtime at 8 pm.    QUEtiapine (SEROQUEL XR) 300 MG 24 hr tablet Take 300 mg by mouth 2 (two) times daily. At 0600 and 1700.    sertraline (ZOLOFT) 50 MG tablet Take 50 mg by mouth daily.     !! - Potential duplicate medications found. Please discuss with provider.       DISCHARGE INSTRUCTIONS:   1. For dialysis in 2 days per schedule 2. PCP follow-up in 1 week   If you experience worsening of your admission symptoms, develop shortness of breath, life threatening emergency, suicidal or homicidal thoughts you must seek medical attention immediately by calling 911 or calling your MD immediately  if symptoms less severe.  You Must read complete instructions/literature along with all the possible adverse reactions/side effects for all the Medicines you take and that have been prescribed to you. Take any new Medicines after you have completely understood and accept all the possible adverse reactions/side effects.   Please  note  You were cared for by a hospitalist during your hospital stay. If you have any questions about your discharge medications or the care you received while you were in the hospital after you are discharged, you can call the unit and asked to speak with the hospitalist on call if the hospitalist that took care of you is not available. Once you are discharged, your primary care physician will handle any further medical issues. Please note that NO REFILLS for any discharge medications will be authorized once you are discharged, as it is imperative that you return to your primary care physician (or establish a relationship with a primary care physician if you do not have one) for your aftercare needs so that they can reassess your need for medications and monitor your lab values.    Today   CHIEF COMPLAINT:   Chief Complaint  Patient presents with  . Vascular Access Problem    VITAL SIGNS:  Blood pressure 102/52, pulse 78, temperature 97.5 F (36.4  C), temperature source Oral, resp. rate 22, height 5\' 6"  (1.676 m), weight 78.5 kg (173 lb 1 oz), SpO2 99 %.  I/O:   Intake/Output Summary (Last 24 hours) at 05/01/16 1534 Last data filed at 05/01/16 1512  Gross per 24 hour  Intake    340 ml  Output   1500 ml  Net  -1160 ml    PHYSICAL EXAMINATION:   Physical Exam  GENERAL: 65 y.o.-year-old patient lying in the bed, calm today, not in distress.  EYES: Pupils equal, round, reactive to light and accommodation. No scleral icterus. Extraocular muscles intact.  HEENT: Head atraumatic, normocephalic. Oropharynx and nasopharynx clear.  NECK: Supple, no jugular venous distention. No thyroid enlargement, no tenderness.  LUNGS: Normal breath sounds bilaterally, no wheezing, rales,rhonchi or crepitation. No use of accessory muscles of respiration.  CARDIOVASCULAR: S1, S2 normal. No murmurs, rubs, or gallops.  ABDOMEN: Soft, nontender, nondistended. Bowel sounds present. No organomegaly  or mass.  EXTREMITIES: No pedal edema, cyanosis, or clubbing. Right groin temp catheter for dialysis present NEUROLOGIC: Not following commands, moving all extremities in bed, speech is not clear PSYCHIATRIC: The patient is alert. Unable to test orientation  SKIN: No obvious rash, lesion, or ulcer.    DATA REVIEW:   CBC  Recent Labs Lab 05/01/16 0358  WBC 2.8*  HGB 12.1*  HCT 36.7*  PLT 111*    Chemistries   Recent Labs Lab 04/26/16 1018  05/01/16 0358  NA 133*  < > 138  K 5.8*  < > 4.8  CL 96*  < > 97*  CO2 23  < > 29  GLUCOSE 70  < > 84  BUN 65*  < > 74*  CREATININE 7.77*  < > 8.55*  CALCIUM 8.5*  < > 8.9  AST 28  --   --   ALT 15*  --   --   ALKPHOS 195*  --   --   BILITOT 0.7  --   --   < > = values in this interval not displayed.  Cardiac Enzymes No results for input(s): TROPONINI in the last 168 hours.  Microbiology Results  Results for orders placed or performed during the hospital encounter of 04/26/16  MRSA PCR Screening     Status: Abnormal   Collection Time: 04/26/16  8:40 PM  Result Value Ref Range Status   MRSA by PCR (A) NEGATIVE Final    INVALID, UNABLE TO DETERMINE THE PRESENCE OF TARGET DNA DUE TO SPECIMEN INTEGRITY. RECOLLECTION REQUESTED.    Comment: CALLED DAWN SONGSTER AT 0022 ON 04/27/16  TO RECOLLECT AND REORDER RWW  MRSA PCR Screening     Status: Abnormal   Collection Time: 04/27/16  3:00 AM  Result Value Ref Range Status   MRSA by PCR (A) NEGATIVE Final    INVALID, UNABLE TO DETERMINE THE PRESENCE OF TARGET DNA DUE TO SPECIMEN INTEGRITY. RECOLLECTION REQUESTED.    Comment:        The GeneXpert MRSA Assay (FDA approved for NASAL specimens only), is one component of a comprehensive MRSA colonization surveillance program. It is not intended to diagnose MRSA infection nor to guide or monitor treatment for MRSA infections. REQUEST FOR RECOLLECT CALLED TO MICHAEL JACOBS AT 0734 ON 04/27/16.[facility]   MRSA PCR Screening     Status:  Abnormal   Collection Time: 04/27/16  2:20 PM  Result Value Ref Range Status   MRSA by PCR (A) NEGATIVE Final    INVALID, UNABLE TO DETERMINE THE PRESENCE OF  TARGET DNA DUE TO SPECIMEN INTEGRITY. RECOLLECTION REQUESTED.    Comment:        The GeneXpert MRSA Assay (FDA approved for NASAL specimens only), is one component of a comprehensive MRSA colonization surveillance program. It is not intended to diagnose MRSA infection nor to guide or monitor treatment for MRSA infections.   MRSA PCR Screening     Status: None   Collection Time: 04/27/16  4:03 PM  Result Value Ref Range Status   MRSA by PCR NEGATIVE NEGATIVE Final    Comment:        The GeneXpert MRSA Assay (FDA approved for NASAL specimens only), is one component of a comprehensive MRSA colonization surveillance program. It is not intended to diagnose MRSA infection nor to guide or monitor treatment for MRSA infections.     RADIOLOGY:  No results found.  EKG:   Orders placed or performed during the hospital encounter of 04/26/16  . ED EKG  . ED EKG      Management plans discussed with the patient, family and they are in agreement.  CODE STATUS:     Code Status Orders        Start     Ordered   04/26/16 1448  Full code   Continuous     04/26/16 1447    Code Status History    Date Active Date Inactive Code Status Order ID Comments User Context   03/03/2015  6:43 PM 03/06/2015  7:33 PM Full Code [id number]  Enid Baasadhika Eydan Chianese, MD Inpatient    Advance Directive Documentation        Most Recent Value   Type of Advance Directive  Healthcare Power of Attorney   Pre-existing out of facility DNR order (yellow form or pink MOST form)     "MOST" Form in Place?        TOTAL TIME TAKING CARE OF THIS PATIENT: 37 minutes.    Addalyn Speedy M.D on 05/01/2016 at 3:34 PM  Between 7am to 6pm - Pager - [phone]  After 6pm go to [email or web address] - password [email or web address] [facility]  [facility]  Office   [phone]  CC: Primary care physician; [facility]

## 2016-05-01 NOTE — Progress Notes (Signed)
Pt clinically stable post procedure, eating lunch, vss, called dialysis nurse/unit with report given, to go there after recovery, also called nurse on floor with plan reveiwed,

## 2016-05-01 NOTE — H&P (Signed)
  [facility] History & Physical Update  The patient was interviewed and re-examined.  The patient's previous History and Physical has been reviewed and is unchanged.  There is no change in the plan of care. We plan to proceed with the scheduled procedure.  Nicki Gracy, MD  05/01/2016, 9:54 AM

## 2016-05-01 NOTE — Progress Notes (Signed)
Alert and oriented. Vital signs stable . No signs of acute distress. Discharge instructions given. Patient verbalizes understanding. Report given to Jasmine DecemberSharon, [facility]. Temporary dialysis access to right femoral was removed. Site dry and intact. No bleeding noted.  No other issues noted at this time.

## 2016-05-01 NOTE — Progress Notes (Signed)
Pre dialysis  

## 2016-05-01 NOTE — Progress Notes (Signed)
[facility] Physicians - [place] at [facility]   PATIENT NAME: [name] Wiley    MR#:  [id number]  DATE OF BIRTH:  08/19/1951  SUBJECTIVE:  CHIEF COMPLAINT:   Chief Complaint  Patient presents with  . Vascular Access Problem   - Patient has underlying mental illness, for AV fistula declotting today - better mood today, not agitated  REVIEW OF SYSTEMS:  Review of Systems  Unable to perform ROS: psychiatric disorder    DRUG ALLERGIES:  No Known Allergies  VITALS:  Blood pressure 100/56, pulse 83, temperature 97.4 F (36.3 C), temperature source Oral, resp. rate 22, height 5\' 6"  (1.676 m), weight 78.5 kg (173 lb 1 oz), SpO2 100 %.  PHYSICAL EXAMINATION:  Physical Exam  GENERAL:  65 y.o.-year-old patient lying in the bed, calm today, not in distress.  EYES: Pupils equal, round, reactive to light and accommodation. No scleral icterus. Extraocular muscles intact.  HEENT: Head atraumatic, normocephalic. Oropharynx and nasopharynx clear.  NECK:  Supple, no jugular venous distention. No thyroid enlargement, no tenderness.  LUNGS: Normal breath sounds bilaterally, no wheezing, rales,rhonchi or crepitation. No use of accessory muscles of respiration.  CARDIOVASCULAR: S1, S2 normal. No murmurs, rubs, or gallops.  ABDOMEN: Soft, nontender, nondistended. Bowel sounds present. No organomegaly or mass.  EXTREMITIES: No pedal edema, cyanosis, or clubbing. Right groin temp catheter for dialysis present NEUROLOGIC: Not following commands,  moving all extremities in bed, speech is not clear PSYCHIATRIC: The patient is alert. Unable to test orientation  SKIN: No obvious rash, lesion, or ulcer.    LABORATORY PANEL:   CBC  Recent Labs Lab 05/01/16 0358  WBC 2.8*  HGB 12.1*  HCT 36.7*  PLT 111*   ------------------------------------------------------------------------------------------------------------------  Chemistries   Recent Labs Lab 04/26/16 1018   05/01/16 0358  NA 133*  < > 138  K 5.8*  < > 4.8  CL 96*  < > 97*  CO2 23  < > 29  GLUCOSE 70  < > 84  BUN 65*  < > 74*  CREATININE 7.77*  < > 8.55*  CALCIUM 8.5*  < > 8.9  AST 28  --   --   ALT 15*  --   --   ALKPHOS 195*  --   --   BILITOT 0.7  --   --   < > = values in this interval not displayed. ------------------------------------------------------------------------------------------------------------------  Cardiac Enzymes No results for input(s): TROPONINI in the last 168 hours. ------------------------------------------------------------------------------------------------------------------  RADIOLOGY:  No results found.  EKG:   Orders placed or performed during the hospital encounter of 04/26/16  . ED EKG  . ED EKG    ASSESSMENT AND PLAN:   [name] Wiley is a 65 y.o. male with a known history of Intellectual disability, ESRD on HD, HTN presents to the hospital for AV fistula occlusion, requiring temporary hemanalysis catheter placement  #1. AV fistula thrombosis -s/p Temp HD catheter placement, AV fistula declotting today -K+ normalized -Appreciate vascular consult.   #2. Hyperkalemia- improved after dialysis.  -normal now  #3. Anxiety and agitation, Bipolar diosrder-Continue Haldol when necessary. Also on Klonopin twice a day. Continue Seroquel, Trileptal and Zoloft  #4.Pancytopenia of unclear etiology, questionable consumption due to thrombosis stable  #5. End-stage renal disease, dialysis fistula is clotted, s/p declotting today- discontinue temporary dialysis catheter,- continue outpatient medications - for dialysis today  #6. History of seizures, continue outpatient medications-on phenytoin  #7 Hypothyroidism-on significantly higher doses of Synthroid TSH low- free T4 on the  lower side as well- similar as outpatient too  #8 DVT prophylaxis-on subcutaneous heparin  Physical Therapy when able to. Ambulates with a walker at baseline    All  the records are reviewed and case discussed with Care Management/Social Workerr. Management plans discussed with the patient, family and they are in agreement.  CODE STATUS: Full Code  TOTAL TIME TAKING CARE OF THIS PATIENT: 37 minutes.   POSSIBLE D/C TOMORROW, DEPENDING ON CLINICAL CONDITION.   Enid BaasKALISETTI,Wolfe Camarena M.D on 05/01/2016 at 2:09 PM  Between 7am to 6pm - Pager - [phone]  After 6pm go to [email or web address] - password [email or web address] [facility]  [facility]  Office  [phone]  CC: Primary care physician; [facility]

## 2016-05-01 NOTE — [place] FL2 (Signed)
[place] MEDICAID FL2 LEVEL OF CARE SCREENING TOOL     IDENTIFICATION  Patient Name: [name] Wiley Birthdate: 08/09/1951 Sex: male Admission Date (Current Location): 04/26/2016  [facility] [place]   Facility and Address:  [facility], [address], [place], [place] [address]      Provider Number: [id number]  Attending Physician Name and Address:  Enid Baasadhika Kalisetti, MD  Relative Name and Phone Number:       Current Level of Care: Hospital Recommended Level of Care: Skilled Nursing Facility Prior Approval Number:    Date Approved/Denied:   PASRR Number:    Discharge Plan: SNF    Current Diagnoses: Patient Active Problem List   Diagnosis Date Noted  . 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 Hyperkalemia 04/26/2016  . Hyponatremia 04/26/2016  . Pancytopenia (HCC) 04/26/2016  . End stage renal disease (HCC) 04/26/2016  . 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    Orientation RESPIRATION BLADDER Height & Weight     Self, Situation, Place  Normal Continent Weight: 173 lb 1 oz (78.5 kg) Height:  5\' 6"  (167.6 cm)  BEHAVIORAL SYMPTOMS/MOOD NEUROLOGICAL BOWEL NUTRITION STATUS   (none)  (none) Continent Diet (renal)  AMBULATORY STATUS COMMUNICATION OF NEEDS Skin   Limited Assist Verbally Normal                       Personal Care Assistance Level of Assistance  Bathing, Dressing, Feeding Bathing Assistance: Limited assistance Feeding assistance: Limited assistance Dressing Assistance: Limited assistance Total Care Assistance: Limited assistance   Functional Limitations Info  Sight, Hearing, Speech Sight Info: Adequate Hearing Info: Adequate Speech Info: Impaired    SPECIAL CARE FACTORS FREQUENCY   (Hemo-dialysis Tues/Thur/Sat [address] 3x week)                    Contractures Contractures Info: Not present    Additional Factors Info  Code Status Code Status Info: Full              Current Medications (05/01/2016):  This is the current hospital active medication list Current Facility-Administered Medications  Medication Dose Route Frequency Provider Last Rate Last Dose  . aspirin EC tablet 81 mg  81 mg Oral Daily Katharina Caperima Vaickute, MD   81 mg at 04/30/16 1043  . benztropine (COGENTIN) tablet 1 mg  1 mg Oral BID Katharina Caperima Vaickute, MD   1 mg at 04/30/16 2046  . bisacodyl (DULCOLAX) suppository 10 mg  10 mg Rectal Daily PRN Enid Baasadhika Kalisetti, MD      . calcium acetate (PHOSLO) capsule 1,334 mg  1,334 mg Oral TID WC Katharina Caperima Vaickute, MD   1,334 mg at 04/30/16 1044  . cefUROXime (ZINACEF) 1.5 g in dextrose 5 % 50 mL IVPB  1.5 g Intravenous Once Annice NeedyJason S Dew, MD      . cinacalcet (SENSIPAR) tablet 90 mg  90 mg Oral QPC breakfast Katharina Caperima Vaickute, MD   90 mg at 04/29/16 0838  . clonazePAM (KLONOPIN) tablet 0.5 mg  0.5 mg Oral BID Katharina Caperima Vaickute, MD   0.5 mg at 04/30/16 2046  . docusate sodium (COLACE) capsule 100 mg  100 mg Oral BID Enedina FinnerSona Patel, MD   100 mg at 04/30/16 2046  . feeding supplement (NEPRO CARB STEADY) liquid 237 mL  237 mL Oral Daily Katharina Caperima Vaickute, MD   237 mL at 04/29/16 0847  . Ferrous Fumarate (HEMOCYTE - 106 mg FE) tablet 106 mg of iron  1 tablet Oral Daily Katharina Caperima Vaickute, MD   106 mg of iron at 04/30/16 1045  . folic acid (FOLVITE) tablet 0.5 mg  500 mcg Oral Daily Cindi CarbonMary M Swayne, RPH   0.5 mg at 04/30/16 1045  . haloperidol (HALDOL) tablet 5 mg  5 mg Oral Once per day on Tue Thu Sat Katharina Caperima Vaickute, MD   5 mg at 04/29/16 [id number]  . haloperidol lactate (HALDOL) injection 1 mg  1 mg Intravenous Q6H PRN Enid Baasadhika Kalisetti, MD   1 mg at 04/30/16 1636  . heparin injection 5,000 Units  5,000 Units Subcutaneous Q12H Katharina Caperima Vaickute, MD   5,000 Units at 04/30/16 2047  . labetalol (NORMODYNE,TRANDATE) injection 10 mg  10 mg Intravenous Q2H PRN Oralia Manisavid Willis, MD      . lactulose (CHRONULAC) 10 GM/15ML solution 20 g  20 g Oral BID PRN Enid Baasadhika Kalisetti, MD      . levothyroxine (SYNTHROID,  LEVOTHROID) tablet 600 mcg  600 mcg Oral QAC breakfast Katharina Caperima Vaickute, MD   600 mcg at 04/30/16 1044  . LORazepam (ATIVAN) injection 1 mg  1 mg Intravenous Q4H PRN Enid Baasadhika Kalisetti, MD   1 mg at 04/30/16 1759  . multivitamin (RENA-VIT) tablet 1 tablet  1 tablet Oral Daily Katharina Caperima Vaickute, MD   1 tablet at 04/30/16 1045  . omega-3 acid ethyl esters (LOVAZA) capsule 1 g  1 g Oral BID Katharina Caperima Vaickute, MD   1 g at 04/30/16 2046  . ondansetron (ZOFRAN) tablet 4 mg  4 mg Oral Q6H PRN Katharina Caperima Vaickute, MD       Or  . ondansetron (ZOFRAN) injection 4 mg  4 mg Intravenous Q6H PRN Katharina Caperima Vaickute, MD      . Oxcarbazepine (TRILEPTAL) tablet 600 mg  600 mg Oral BID Katharina Caperima Vaickute, MD   600 mg at 04/30/16 2046  . pantoprazole (PROTONIX) EC tablet 40 mg  40 mg Oral Daily Katharina Caperima Vaickute, MD   40 mg at 04/30/16 1046  . phenytoin (DILANTIN) ER capsule 100 mg  100 mg Oral Reita ChardBH-q7a Rima Vaickute, MD   100 mg at 05/01/16 0453  . phenytoin (DILANTIN) ER capsule 300 mg  300 mg Oral QHS Katharina Caperima Vaickute, MD   300 mg at 04/30/16 2038  . polyethylene glycol (MIRALAX / GLYCOLAX) packet 17 g  17 g Oral Daily Enid Baasadhika Kalisetti, MD      . QUEtiapine (SEROQUEL XR) 24 hr tablet 300 mg  300 mg Oral BID Katharina Caperima Vaickute, MD   300 mg at 04/30/16 2045  . senna-docusate (Senokot-S) tablet 1 tablet  1 tablet Oral Daily Enedina FinnerSona Patel, MD   1 tablet at 04/30/16 1040  . sertraline (ZOLOFT) tablet 50 mg  50 mg Oral Daily Katharina Caperima Vaickute, MD   50 mg at 04/30/16 1040  . sodium chloride flush (NS) 0.9 % injection 3 mL  3 mL Intravenous Q12H Katharina Caperima Vaickute, MD   3 mL at 04/30/16 2047     Discharge Medications: Please see discharge summary for a list of discharge medications.  Relevant Imaging Results:  Relevant Lab Results:   Additional Information SSN [id number]  York SpanielMonica Roshawn Lacina, LCSW

## 2016-05-01 NOTE — Progress Notes (Signed)
tx initiation

## 2016-05-01 NOTE — Clinical Social Work Note (Signed)
Physician to discharge patient to return to [facility] today. Physician is waiting for patient to have a bowel movement then he can discharge. Discharge information sent to [facility]. Patient's sister in law yesterday stated she would transport patient when time more than likely. CSW attempted to reach patient's brother: Gardiner Barefootathaniel at [phone] but there was no answer. Patient's nurse to try and reach family for transport. York SpanielMonica Immanuel Fedak MSW,LCSW [phone]

## 2016-05-01 NOTE — Progress Notes (Signed)
Pt did calm down this shift but became angry and agitated for a little bit when we were unable to tell him the time of his procedure in this AM. He did go back to sleep. Pt noted that he is done with this hospital and [facility].

## 2016-05-01 NOTE — Progress Notes (Signed)
Pre Dialysis 

## 2016-05-01 NOTE — Clinical Social Work Note (Signed)
CSW spoke with patient's nurse and she was able to reach patient's sister to notify of discharge. York SpanielMonica Damiano Stamper MSW,LCSW [phone]

## 2016-05-01 NOTE — Op Note (Signed)
[facility]    OPERATIVE NOTE   PROCEDURE: 1.  Right brachial artery to axillary vein arteriovenous graft cannulation under ultrasound guidance in both a retrograde and then antegrade fashion crossing 2.  Right arm shuntogram and central venogram 3.  Catheter directed thrombolysis with 4 mg of TPA  4.  Mechanical thrombectomy to the AV graft, axillary vein, and subclavian vein with the Penumbra CAT D catheter 5.  Fogarty embolectomy for residual arterial plug 6.  Percutaneous transluminal angioplasty of arterial anastomosis with 7 mm diameter high pressure angioplasty balloon 7.  Percutaneous transluminal angioplasty of the axillary vein, subclavian vein, and innominate vein, with 10 mm diameter high pressure angioplasty balloon  PRE-OPERATIVE DIAGNOSIS: 1. ESRD 2.  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 As above  SPECIMEN(S):  None  CONTRAST: 30 cc  FLUORO TIME:  10.7 minutes  INDICATIONS: Patient is a 65 y.o.male who presents with a thrombosed right brachial artery to axillary vein arteriovenous graft.  The patient is scheduled for an attempted declot and shuntogram.  The patient is aware the risks include but are not limited to: bleeding, infection, thrombosis of the cannulated access, and possible anaphylactic reaction to the contrast.  The patient is aware of the risks of the procedure and elects to proceed forward.  DESCRIPTION: After full informed written consent was obtained, the patient was brought back to the angiography suite and placed supine upon the angiography table.  The patient was connected to  monitoring equipment. Moderate conscious sedation was administered with a face to face encounter with the patient throughout the procedure with my supervision of the RN administering medicines and monitoring the patient's vital signs, pulse oximetry, telemetry and mental status throughout from the start of the procedure until the patient was taken to the recovery room. The right arm was prepped and draped in the standard fashion for a percutaneous access intervention.  Under ultrasound guidance, the right arteriovenous graft was cannulated with a micropuncture needle under direct ultrasound guidance due to the pulseless nature of the graft in both an antegrade and a retrograde fashion crossing, and permanent images were performed.  The microwire was advanced and the needle was exchanged for the a microsheath.  I then upsized to a 6 Fr Sheath and imaging was performed.  Hand injections were completed to image the access including the central venous system. This demonstrated no flow within the AV graft.  Based on the images, this patient will need extensive treatment to salvage the graft. I then gave the patient 3000 units of intravenous heparin.  I then placed a Magic torque wire into the brachial artery from the retrograde sheath and into the subclavian vein from the antegrade sheath. The patient had stents from his mid graft all the way to the subclavian vein. 4 mg of TPA were deployed throughout the entirety of the graft through the sheaths and a Kumpe catheter. This was allowed to dwell for 10-15 minutes.  This uncovered a thrombosed graft with still minimal flow.  A residual arterial plug was also seen at the arterial anastomosis. An attempt to clear the arterial plug was done with 3 passes of the Fogarty embolectomy  balloon. Flow-limiting arterial plug remained, and I elected to treat this lesion with a 7 mm diameter by 8 cm length high pressure angioplasty balloon. This was inflated to 16 atm for 1 minute.  This resulted in resolution of the arterial plug, and clearance of the arterial side of the graft with no significant residual stenosis. The arterial outflow was seen to be intact as well on these images. The retrograde sheath was removed. I then turned my attention to the thrombus in the distal graft and the axillary and subclavian vein. Mechanical thrombectomy was performed after upsizing to an 8 French sheath and I elected to use the penumbra CAT D device. This resulted in resolution of the thrombus throughout the graft and in the axillary and subclavian veins, but in-stent stenosis was present in the axillary and subclavian veins which was reasonably high-grade in the 75-80% range.  I then elected to treat this with 2 inflations of a high pressure 10 mm diameter by 8 cm length angioplasty balloon first in the axillary vein and then into the subclavian vein with the most proximal tip just into the innominate vein. The first inflation was to 20 atm and the second inflation was to 16 atm. Completion angiogram following these inflations showed less than 20% residual stenosis in the axillary and subclavian veins.    Based on the completion imaging, no further intervention is necessary.  The wire and balloon were removed from the sheath.  A 4-0 Monocryl purse-string suture was sewn around the sheath.  The sheath was removed while tying down the suture.  A sterile bandage was applied to the puncture site.  COMPLICATIONS: None  CONDITION: Stable   Jacobb Alen 05/01/2016 11:11 AM

## 2016-05-01 NOTE — Progress Notes (Signed)
Post hd 

## 2016-05-01 NOTE — Progress Notes (Signed)
PT Cancellation Note  Patient Details Name: [name] Wiley MRN: [id number] DOB: 04/05/1951   Cancelled Treatment:    Reason Eval/Treat Not Completed: Patient at procedure or test/unavailable.  Will try later as time and pt allow.   Ivar DrapeStout, Vaughan Garfinkle E 05/01/2016, 9:45 AM  Samul Dadauth Celestine Bougie, PT MS Acute Rehab Dept. [facility]: [facility] [phone] and [facility] [phone]

## 2016-05-02 ENCOUNTER — Encounter: Payer: Self-pay | Admitting: Vascular Surgery

## 2016-05-17 ENCOUNTER — Emergency Department
Admission: EM | Admit: 2016-05-17 | Discharge: 2016-05-17 | Disposition: A | Discharge status: Home / Self Care | Payer: Medicare Other | Attending: Emergency Medicine | Admitting: Emergency Medicine

## 2016-05-17 ENCOUNTER — Emergency Department: Payer: Medicare Other

## 2016-05-17 DIAGNOSIS — I120 Hypertensive chronic kidney disease with stage 5 chronic kidney disease or end stage renal disease: Secondary | ICD-10-CM | POA: Insufficient documentation

## 2016-05-17 DIAGNOSIS — Z0001 Encounter for general adult medical examination with abnormal findings: Secondary | ICD-10-CM | POA: Insufficient documentation

## 2016-05-17 DIAGNOSIS — Z992 Dependence on renal dialysis: Secondary | ICD-10-CM | POA: Insufficient documentation

## 2016-05-17 DIAGNOSIS — Y828 Other medical devices associated with adverse incidents: Secondary | ICD-10-CM | POA: Diagnosis not present

## 2016-05-17 DIAGNOSIS — Z7982 Long term (current) use of aspirin: Secondary | ICD-10-CM | POA: Insufficient documentation

## 2016-05-17 DIAGNOSIS — N186 End stage renal disease: Secondary | ICD-10-CM | POA: Diagnosis not present

## 2016-05-17 DIAGNOSIS — T829XXA Unspecified complication of cardiac and vascular prosthetic device, implant and graft, initial encounter: Secondary | ICD-10-CM | POA: Diagnosis not present

## 2016-05-17 DIAGNOSIS — M7989 Other specified soft tissue disorders: Secondary | ICD-10-CM | POA: Diagnosis present

## 2016-05-17 DIAGNOSIS — Z0000 Encounter for general adult medical examination without abnormal findings: Secondary | ICD-10-CM

## 2016-05-17 DIAGNOSIS — E039 Hypothyroidism, unspecified: Secondary | ICD-10-CM | POA: Insufficient documentation

## 2016-05-17 DIAGNOSIS — I2510 Atherosclerotic heart disease of native coronary artery without angina pectoris: Secondary | ICD-10-CM | POA: Insufficient documentation

## 2016-05-17 DIAGNOSIS — R52 Pain, unspecified: Secondary | ICD-10-CM | POA: Insufficient documentation

## 2016-05-17 LAB — CBC WITH DIFFERENTIAL/PLATELET
Basophils Absolute: 0 10*3/uL (ref 0–0.1)
Basophils Relative: 1 %
EOS ABS: 0.1 10*3/uL (ref 0–0.7)
EOS PCT: 3 %
HCT: 29.8 % — ABNORMAL LOW (ref 40.0–52.0)
Hemoglobin: 9.7 g/dL — ABNORMAL LOW (ref 13.0–18.0)
LYMPHS ABS: 0.7 10*3/uL — AB (ref 1.0–3.6)
LYMPHS PCT: 22 %
MCH: 32.3 pg (ref 26.0–34.0)
MCHC: 32.6 g/dL (ref 32.0–36.0)
MCV: 99.2 fL (ref 80.0–100.0)
MONO ABS: 0.5 10*3/uL (ref 0.2–1.0)
Monocytes Relative: 15 %
Neutro Abs: 1.9 10*3/uL (ref 1.4–6.5)
Neutrophils Relative %: 59 %
PLATELETS: 112 10*3/uL — AB (ref 150–440)
RBC: 3 MIL/uL — AB (ref 4.40–5.90)
RDW: 15.3 % — AB (ref 11.5–14.5)
WBC: 3.3 10*3/uL — AB (ref 3.8–10.6)

## 2016-05-17 LAB — COMPREHENSIVE METABOLIC PANEL
ALT: 13 U/L — AB (ref 17–63)
ANION GAP: 12 (ref 5–15)
AST: 20 U/L (ref 15–41)
Albumin: 3 g/dL — ABNORMAL LOW (ref 3.5–5.0)
Alkaline Phosphatase: 190 U/L — ABNORMAL HIGH (ref 38–126)
BUN: 52 mg/dL — AB (ref 6–20)
CO2: 24 mmol/L (ref 22–32)
CREATININE: 6.85 mg/dL — AB (ref 0.61–1.24)
Calcium: 8.3 mg/dL — ABNORMAL LOW (ref 8.9–10.3)
Chloride: 95 mmol/L — ABNORMAL LOW (ref 101–111)
GFR calc non Af Amer: 7 mL/min — ABNORMAL LOW (ref >60)
GFR, EST AFRICAN AMERICAN: 9 mL/min — AB (ref >60)
GLUCOSE: 63 mg/dL — AB (ref 65–99)
Potassium: 4.5 mmol/L (ref 3.5–5.1)
SODIUM: 131 mmol/L — AB (ref 135–145)
Total Bilirubin: 0.2 mg/dL — ABNORMAL LOW (ref 0.3–1.2)
Total Protein: 6.1 g/dL — ABNORMAL LOW (ref 6.5–8.1)

## 2016-05-17 LAB — PHENYTOIN LEVEL, TOTAL: Phenytoin Lvl: 4.6 ug/mL — ABNORMAL LOW (ref 10.0–20.0)

## 2016-05-17 NOTE — ED Notes (Signed)
Assessed fistula.  Bruit good, but thrill decreased to palpation.  R arm is more swollen than L.

## 2016-05-17 NOTE — ED Notes (Signed)
Pt returning to [facility] via EMS.  Attempted for 30 mins was made to speak to a nursing supervisor. Unable to reach any staff member at [facility] who could verify availability of alternate  transport. Pt's nurse at [facility] is aware that pt will need his dialysis appt rescheduled.

## 2016-05-17 NOTE — ED Notes (Signed)
Pt is a resident at [facility], comes to ER from [facility] dialysis.  Pt's R arm has fistula, but arm is swollen in comparison with left.  [facility] attempted to access fistual, but was unsuccessful in flow.  Pt in NAD at this time.  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  Pt having no labored breathing.  Pt does appear to have generalized edema.

## 2016-05-17 NOTE — Discharge Instructions (Signed)
The ultrasound looks normal blood work is normal to except for his more anemic than he was. This probably a side effect of the dialysis. I spoke to Dr. Wyn Quaker who feels everything should be okay. I will let him return to dialysis please return for any further problems

## 2016-05-17 NOTE — ED Notes (Signed)
[facility] arrived to transport pt back to [facility]. [facility] called to verify pt has space available and staff reported all of the dialysis techs have finished their other patients and have all already left for the day. EDP notified, stated pt is medically stable to return to dialysis on Monday instead of Tuesday. [facility] staff report they are already in contact with nursing supervisor at [facility] to reschedule appointment.

## 2016-05-17 NOTE — ED Notes (Signed)
Called to room by pt's family. Pt noted to have pulled out IV, small amount of bleeding noted down pt's arm and on his shirt/sheets. Pt's arm cleaned and dirty linens removed. MD at bedside.

## 2016-05-17 NOTE — ED Notes (Signed)

## 2016-05-17 NOTE — ED Provider Notes (Addendum)
[facility] Emergency Department Provider Note   ____________________________________________  Time seen: Approximately 7:45 AM  I have reviewed the triage vital signs and the nursing notes.   HISTORY  Chief Complaint Vascular Access Problem History limited by intellectual disability   HPI [name] Wiley is a 65 y.o. male patient with a history of intellectual disability who has a shunt in the right arm to receive dialysis was sent here from dialysis because of swelling in the right arm and decreased thrill in the arm. Patient has a speech impediment is very difficult to understand but does not appear to be in any pain or having any particular problems.  Past Medical History  Diagnosis Date  . Dialysis patient (HCC)   . Hypertension   . Intellectual disability   . Seizures (HCC)   . GERD (gastroesophageal reflux disease)   . Renal disorder   . Hypothyroid   . CAD (coronary artery disease)   . Anxiety     Patient Active Problem List   Diagnosis Date Noted  . 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 Hyperkalemia 04/26/2016  . Hyponatremia 04/26/2016  . Pancytopenia (HCC) 04/26/2016  . End stage renal disease (HCC) 04/26/2016  . 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 Insert / replace / remove pacemaker    . Av fistula placement Right   . Fistulogram Right 03/06/2015    Procedure: Fistulogram;  Surgeon: Renford Dills, MD;  Location: [facility] INVASIVE CV LAB;  Service: Cardiovascular;  Laterality: Right;  . Peripheral vascular catheterization N/A 05/01/2016    Procedure: A/V Shunt Intervention/declot, possible permcath placement;  Surgeon: Annice Needy, MD;  Location: [facility] INVASIVE CV LAB;  Service: Cardiovascular;  Laterality: N/A;    Current Outpatient Rx  Name  Route  Sig  Dispense  Refill  . aspirin EC 81 MG tablet   Oral   Take 81 mg by mouth daily.         . benztropine (COGENTIN)  1 MG tablet   Oral   Take 1 mg by mouth 2 (two) times daily.         . calcium acetate (PHOSLO) 667 MG capsule   Oral   Take 1,334 mg by mouth 3 (three) times daily with meals.         . cholecalciferol (VITAMIN D) 1000 units tablet   Oral   Take 1,000 Units by mouth daily.         . cinacalcet (SENSIPAR) 30 MG tablet   Oral   Take 90 mg by mouth daily. Take with lunch.         . clonazePAM (KLONOPIN) 0.5 MG tablet   Oral   Take 1 tablet (0.5 mg total) by mouth 2 (two) times daily. *hold for sedation*   20 tablet   0   . docusate sodium (COLACE) 100 MG capsule   Oral   Take 1 capsule (100 mg total) by mouth 2 (two) times daily.   10 capsule   0   . Ferrous Fumarate 324 (106 FE) MG TABS   Oral   Take 1 tablet by mouth daily.         . folic acid (FOLVITE) 400 MCG tablet   Oral   Take 400 mcg by mouth daily.          . haloperidol (HALDOL) 5 MG tablet   Oral   Take 1 tablet (5 mg total)  by mouth 3 (three) times a week. Pt takes on Tuesday, Thursday, and Saturday.   20 tablet   0   . levothyroxine (SYNTHROID, LEVOTHROID) 300 MCG tablet   Oral   Take 600 mcg by mouth daily before breakfast.         . metolazone (ZAROXOLYN) 2.5 MG tablet   Oral   Take 2.5 mg by mouth daily.         . multivitamin (RENA-VIT) TABS tablet   Oral   Take 1 tablet by mouth daily.         . Nutritional Supplements (FEEDING SUPPLEMENT, NEPRO CARB STEADY,) LIQD   Oral   Take 237 mLs by mouth daily.   1000 mL   0   . Nutritional Supplements (NEPRO) LIQD   Oral   Take 240 mLs by mouth daily.         Marland Kitchen omega-3 acid ethyl esters (LOVAZA) 1 G capsule   Oral   Take 1 g by mouth 2 (two) times daily.         Marland Kitchen omeprazole (PRILOSEC OTC) 20 MG tablet   Oral   Take 20 mg by mouth daily.         Marland Kitchen oxcarbazepine (TRILEPTAL) 600 MG tablet   Oral   Take 600 mg by mouth 2 (two) times daily.         Marland Kitchen oxyCODONE-acetaminophen (PERCOCET/ROXICET) 5-325 MG tablet    Oral   Take 1 tablet by mouth every 6 (six) hours as needed for moderate pain or severe pain.   30 tablet   0   . phenytoin (DILANTIN) 100 MG ER capsule   Oral   Take 100-300 mg by mouth See admin instructions. Take 1 capsule by mouth at 6 am, and take 3 capsules (300 mg) by mouth every night at bedtime at 8 pm.         . QUEtiapine (SEROQUEL XR) 300 MG 24 hr tablet   Oral   Take 300 mg by mouth 2 (two) times daily. At 0600 and 1700.         . senna-docusate (SENOKOT-S) 8.6-50 MG tablet   Oral   Take 1 tablet by mouth daily.   60 tablet   0   . sertraline (ZOLOFT) 50 MG tablet   Oral   Take 50 mg by mouth daily.           Allergies Review of patient's allergies indicates no known allergies.  Family History  Problem Relation Age of Onset  . Congestive Heart Failure Father     Social History Social History  Substance Use Topics  . Smoking status: Never Smoker   . Smokeless tobacco: Never Used  . Alcohol Use: No    Review of Systems Constitutional: No fever/chills Unable to obtain.  ____________________________________________   PHYSICAL EXAM:  VITAL SIGNS: ED Triage Vitals  Enc Vitals Group     BP 05/17/16 0655 113/52 mmHg     Pulse --      Resp 05/17/16 0655 16     Temp 05/17/16 0655 97 F (36.1 C)     Temp Source 05/17/16 0655 Axillary     SpO2 05/17/16 0655 99 %     Weight --      Height --      Head Cir --      [facility] Flow --      Pain Score --      Pain Loc --  Pain Edu? --      Excl. in GC? --     Constitutional: Alert and oriented. Well appearing and in no acute distress. Eyes: Conjunctivae are normal. PERRL. EOMI. Head: Atraumatic. Nose: No congestion/rhinnorhea. Mouth/Throat: Mucous membranes are moist.   Neck: No stridor.  Cardiovascular: Normal rate, regular rhythm. Grossly normal heart sounds.  Good peripheral circulation. Respiratory: Normal respiratory effort.  No retractions. Lungs occasional scattered  wheezes Gastrointestinal: Soft and nontender. No distention. No abdominal bruits. No CVA tenderness. Musculoskeletal: No lower extremity tenderness nor edema.  No joint effusions. There is no thrill in the right arm where the shunt is. Neurologic:  Normal speech and language. No gross focal neurologic deficits are appreciated. No gait instability. Skin:  Skin is warm, dry and intact. No rash noted. Psychiatric: Mood and affect are normal. Speech and behavior are normal.  ____________________________________________   LABS (all labs ordered are listed, but only abnormal results are displayed)  Labs Reviewed  COMPREHENSIVE METABOLIC PANEL - Abnormal; Notable for the following:    Sodium 131 (*)    Chloride 95 (*)    Glucose, Bld 63 (*)    BUN 52 (*)    Creatinine, Ser 6.85 (*)    Calcium 8.3 (*)    Total Protein 6.1 (*)    Albumin 3.0 (*)    ALT 13 (*)    Alkaline Phosphatase 190 (*)    Total Bilirubin 0.2 (*)    GFR calc non Af Amer 7 (*)    GFR calc Af Amer 9 (*)    All other components within normal limits  CBC WITH DIFFERENTIAL/PLATELET - Abnormal; Notable for the following:    WBC 3.3 (*)    RBC 3.00 (*)    Hemoglobin 9.7 (*)    HCT 29.8 (*)    RDW 15.3 (*)    Platelets 112 (*)    Lymphs Abs 0.7 (*)    All other components within normal limits  PHENYTOIN LEVEL, TOTAL - Abnormal; Notable for the following:    Phenytoin Lvl 4.6 (*)    All other components within normal limits   ____________________________________________  EKG   ____________________________________________  RADIOLOGY   ____________________________________________   PROCEDURES    Procedures    ____________________________________________   INITIAL IMPRESSION / ASSESSMENT AND PLAN / ED COURSE  Pertinent labs & imaging results that were available during my care of the patient were reviewed by me and considered in my medical decision making (see chart for  details).   ____________________________________________   FINAL CLINICAL IMPRESSION(S) / ED DIAGNOSES  Final diagnoses:  Pain  Normal physical exam    I need to add that the patient actually did not have any pain but I could not find evaluate shunt in the reasons for the ultrasound so I put that in. NEW MEDICATIONS STARTED DURING THIS VISIT:  New Prescriptions   No medications on file     Note:  This document was prepared using Dragon voice recognition software and may include unintentional dictation errors.    Arnaldo Natal, MD 05/17/16 1510  Arnaldo Natal, MD 05/17/16 [phone]

## 2016-05-17 NOTE — ED Notes (Signed)
Spoke to [facility] from [facility]. Per dialysis center, space is available for pt to return to center to finish his appt. Center staff aware that Dr. Wyn Quaker (Vascular surgery) has been in communication with EDP on pt status.

## 2017-09-23 ENCOUNTER — Inpatient Hospital Stay
Admission: EM | Admit: 2017-09-23 | Discharge: 2017-09-25 | DRG: 640 | Disposition: A | Discharge status: Skilled Nursing Facility | Payer: Medicare Other | Attending: Internal Medicine | Admitting: Internal Medicine

## 2017-09-23 ENCOUNTER — Encounter: Payer: Self-pay | Admitting: Emergency Medicine

## 2017-09-23 ENCOUNTER — Emergency Department: Payer: Medicare Other

## 2017-09-23 ENCOUNTER — Other Ambulatory Visit: Payer: Self-pay

## 2017-09-23 DIAGNOSIS — I2510 Atherosclerotic heart disease of native coronary artery without angina pectoris: Secondary | ICD-10-CM | POA: Diagnosis present

## 2017-09-23 DIAGNOSIS — G9340 Encephalopathy, unspecified: Secondary | ICD-10-CM

## 2017-09-23 DIAGNOSIS — D631 Anemia in chronic kidney disease: Secondary | ICD-10-CM | POA: Diagnosis present

## 2017-09-23 DIAGNOSIS — Z9115 Patient's noncompliance with renal dialysis: Secondary | ICD-10-CM

## 2017-09-23 DIAGNOSIS — E162 Hypoglycemia, unspecified: Secondary | ICD-10-CM | POA: Diagnosis present

## 2017-09-23 DIAGNOSIS — G9341 Metabolic encephalopathy: Secondary | ICD-10-CM | POA: Diagnosis present

## 2017-09-23 DIAGNOSIS — F319 Bipolar disorder, unspecified: Secondary | ICD-10-CM | POA: Diagnosis present

## 2017-09-23 DIAGNOSIS — E875 Hyperkalemia: Secondary | ICD-10-CM | POA: Diagnosis present

## 2017-09-23 DIAGNOSIS — F419 Anxiety disorder, unspecified: Secondary | ICD-10-CM | POA: Diagnosis present

## 2017-09-23 DIAGNOSIS — Z992 Dependence on renal dialysis: Secondary | ICD-10-CM | POA: Diagnosis not present

## 2017-09-23 DIAGNOSIS — K219 Gastro-esophageal reflux disease without esophagitis: Secondary | ICD-10-CM | POA: Diagnosis present

## 2017-09-23 DIAGNOSIS — I120 Hypertensive chronic kidney disease with stage 5 chronic kidney disease or end stage renal disease: Secondary | ICD-10-CM | POA: Diagnosis present

## 2017-09-23 DIAGNOSIS — N186 End stage renal disease: Secondary | ICD-10-CM | POA: Diagnosis present

## 2017-09-23 DIAGNOSIS — Z79899 Other long term (current) drug therapy: Secondary | ICD-10-CM

## 2017-09-23 DIAGNOSIS — N2581 Secondary hyperparathyroidism of renal origin: Secondary | ICD-10-CM | POA: Diagnosis present

## 2017-09-23 DIAGNOSIS — F79 Unspecified intellectual disabilities: Secondary | ICD-10-CM | POA: Diagnosis present

## 2017-09-23 DIAGNOSIS — E039 Hypothyroidism, unspecified: Secondary | ICD-10-CM | POA: Diagnosis present

## 2017-09-23 DIAGNOSIS — G40909 Epilepsy, unspecified, not intractable, without status epilepticus: Secondary | ICD-10-CM | POA: Diagnosis present

## 2017-09-23 DIAGNOSIS — N19 Unspecified kidney failure: Secondary | ICD-10-CM

## 2017-09-23 LAB — CBC WITH DIFFERENTIAL/PLATELET
Basophils Absolute: 0 10*3/uL (ref 0–0.1)
Basophils Relative: 1 %
EOS ABS: 0 10*3/uL (ref 0–0.7)
EOS PCT: 1 %
HCT: 34.2 % — ABNORMAL LOW (ref 40.0–52.0)
Hemoglobin: 11.2 g/dL — ABNORMAL LOW (ref 13.0–18.0)
LYMPHS ABS: 0.6 10*3/uL — AB (ref 1.0–3.6)
Lymphocytes Relative: 14 %
MCH: 33.2 pg (ref 26.0–34.0)
MCHC: 32.7 g/dL (ref 32.0–36.0)
MCV: 101.8 fL — ABNORMAL HIGH (ref 80.0–100.0)
Monocytes Absolute: 0.5 10*3/uL (ref 0.2–1.0)
Monocytes Relative: 12 %
Neutro Abs: 3 10*3/uL (ref 1.4–6.5)
Neutrophils Relative %: 72 %
PLATELETS: 137 10*3/uL — AB (ref 150–440)
RBC: 3.36 MIL/uL — ABNORMAL LOW (ref 4.40–5.90)
RDW: 16.7 % — ABNORMAL HIGH (ref 11.5–14.5)
WBC: 4.1 10*3/uL (ref 3.8–10.6)

## 2017-09-23 LAB — COMPREHENSIVE METABOLIC PANEL
ALBUMIN: 3.4 g/dL — AB (ref 3.5–5.0)
ALT: 29 U/L (ref 17–63)
ANION GAP: 22 — AB (ref 5–15)
AST: 39 U/L (ref 15–41)
Alkaline Phosphatase: 181 U/L — ABNORMAL HIGH (ref 38–126)
BILIRUBIN TOTAL: 1 mg/dL (ref 0.3–1.2)
BUN: 161 mg/dL — ABNORMAL HIGH (ref 6–20)
CALCIUM: 8.7 mg/dL — AB (ref 8.9–10.3)
CO2: 20 mmol/L — AB (ref 22–32)
Chloride: 93 mmol/L — ABNORMAL LOW (ref 101–111)
Creatinine, Ser: 15.86 mg/dL — ABNORMAL HIGH (ref 0.61–1.24)
GFR calc Af Amer: 3 mL/min — ABNORMAL LOW (ref >60)
GFR calc non Af Amer: 3 mL/min — ABNORMAL LOW (ref >60)
GLUCOSE: 100 mg/dL — AB (ref 65–99)
POTASSIUM: 6.7 mmol/L — AB (ref 3.5–5.1)
SODIUM: 135 mmol/L (ref 135–145)
Total Protein: 7.1 g/dL (ref 6.5–8.1)

## 2017-09-23 LAB — TROPONIN I: TROPONIN I: 0.15 ng/mL — AB (ref <0.03)

## 2017-09-23 MED ORDER — DEXTROSE 50 % IV SOLN
2.0000 | Freq: Once | INTRAVENOUS | Status: AC
  Administered 2017-09-23: 100 mL via INTRAVENOUS
  Filled 2017-09-23 (×2): qty 100

## 2017-09-23 MED ORDER — INSULIN ASPART 100 UNIT/ML ~~LOC~~ SOLN
SUBCUTANEOUS | Status: AC
  Administered 2017-09-23: 10 [IU] via INTRAVENOUS
  Filled 2017-09-23: qty 1

## 2017-09-23 MED ORDER — CALCIUM GLUCONATE 10 % IV SOLN
1.0000 g | Freq: Once | INTRAVENOUS | Status: AC
  Administered 2017-09-23: 1 g via INTRAVENOUS
  Filled 2017-09-23: qty 10

## 2017-09-23 MED ORDER — SODIUM BICARBONATE 8.4 % IV SOLN
50.0000 meq | Freq: Once | INTRAVENOUS | Status: AC
  Administered 2017-09-23: 50 meq via INTRAVENOUS
  Filled 2017-09-23: qty 50

## 2017-09-23 MED ORDER — INSULIN ASPART 100 UNIT/ML IV SOLN
10.0000 [IU] | Freq: Once | INTRAVENOUS | Status: AC
  Administered 2017-09-23: 10 [IU] via INTRAVENOUS
  Filled 2017-09-23: qty 0.1

## 2017-09-23 NOTE — H&P (Signed)
[employer] - [place] at [facility]   PATIENT NAME: [name] Wiley    MR#:  [id number]  DATE OF BIRTH:  1950-12-05  DATE OF ADMISSION:  09/23/2017  PRIMARY CARE PHYSICIAN: Clinic, [facility]   REQUESTING/REFERRING PHYSICIAN: Dr. Alfonse Flavors  CHIEF COMPLAINT:   Chief Complaint  Patient presents with  . Weakness    HISTORY OF PRESENT ILLNESS:  [name] Wiley  is a 66 y.o. male with a known history of mental retardation, bipolar disorder, history of seizures, ESRD on hemodialysis, GERD, hypertension, secondary hyperparathyroidism, anxiety who presents to the hospital from a skilled nursing facility due to patient having missed his hemodialysis over the past week or so. He is now noted to be acutely hyperkalemic. Patient himself is a very poor historian given his bipolar disorder and mental retardation therefore most of the history obtained from the chart. Patient resides at a skilled nursing facility apparently has been refusing to go to dialysis over the past week. He was sent to the ER for further evaluation today was noted to be acutely hyperkalemic and therefore hospitalist services were contacted further treatment and evaluation.  PAST MEDICAL HISTORY:   Past Medical History:  Diagnosis Date  . Anxiety   . CAD (coronary artery disease)   . Dialysis patient (HCC)   . GERD (gastroesophageal reflux disease)   . Hypertension   . Hypothyroid   . Intellectual disability   . Renal disorder   . Seizures (HCC)     PAST SURGICAL HISTORY:   Past Surgical History:  Procedure Laterality Date  . AV FISTULA PLACEMENT Right   . FISTULOGRAM Right 03/06/2015   Procedure: Fistulogram;  Surgeon: Renford Dills, MD;  Location: [facility] INVASIVE CV LAB;  Service: Cardiovascular;  Laterality: Right;  . INSERT / REPLACE / REMOVE PACEMAKER    . PERIPHERAL VASCULAR CATHETERIZATION N/A 05/01/2016   Procedure: A/V Shunt Intervention/declot, possible permcath placement;   Surgeon: Annice Needy, MD;  Location: [facility] INVASIVE CV LAB;  Service: Cardiovascular;  Laterality: N/A;    SOCIAL HISTORY:   Social History   Tobacco Use  . Smoking status: Never Smoker  . Smokeless tobacco: Never Used  Substance Use Topics  . Alcohol use: No    FAMILY HISTORY:   Family History  Problem Relation Age of Onset  . Congestive Heart Failure Father     DRUG ALLERGIES:  No Known Allergies  REVIEW OF SYSTEMS:   Review of Systems  Unable to perform ROS: Mental acuity    MEDICATIONS AT HOME:   Prior to Admission medications   Medication Sig Start Date End Date Taking? Authorizing Provider  calcium acetate (PHOSLO) 667 MG capsule Take 1,334 mg by mouth 3 (three) times daily with meals.   Yes [provider]  cholecalciferol (VITAMIN D) 1000 units tablet Take 1,000 Units by mouth daily.   Yes [provider]  cinacalcet (SENSIPAR) 60 MG tablet Take 120 mg by mouth daily. Take with lunch.    Yes [provider]  clonazePAM (KLONOPIN) 0.5 MG tablet Take 1 tablet (0.5 mg total) by mouth 2 (two) times daily. *hold for sedation* 05/01/16  Yes Enid Baas, MD  docusate sodium (COLACE) 100 MG capsule Take 1 capsule (100 mg total) by mouth 2 (two) times daily. 05/01/16  Yes Enid Baas, MD  Ferrous Fumarate 324 (106 FE) MG TABS Take 1 tablet by mouth daily.   Yes [provider]  folic acid (FOLVITE) 400 MCG tablet Take 400 mcg by  mouth daily.    Yes [provider]  haloperidol (HALDOL) 5 MG tablet Take 1 tablet (5 mg total) by mouth 3 (three) times a week. Pt takes on Tuesday, Thursday, and Saturday. 05/01/16  Yes Enid BaasKalisetti, Radhika, MD  levothyroxine (SYNTHROID, LEVOTHROID) 300 MCG tablet Take 600 mcg by mouth daily before breakfast.   Yes [provider]  metolazone (ZAROXOLYN) 2.5 MG tablet Take 2.5 mg by mouth daily.   Yes [provider]  multivitamin (RENA-VIT) TABS tablet Take 1 tablet by mouth  daily.   Yes [provider]  omega-3 acid ethyl esters (LOVAZA) 1 G capsule Take 1 g by mouth 2 (two) times daily.   Yes [provider]  omeprazole (PRILOSEC OTC) 20 MG tablet Take 20 mg by mouth daily.   Yes [provider]  oxcarbazepine (TRILEPTAL) 600 MG tablet Take 600 mg by mouth 2 (two) times daily.   Yes [provider]  phenytoin (DILANTIN) 100 MG ER capsule Take 100-300 mg by mouth See admin instructions. Take 1 capsule by mouth at 6 am, and take 3 capsules (300 mg) by mouth every night at bedtime at 8 pm.   Yes [provider]  QUEtiapine (SEROQUEL XR) 300 MG 24 hr tablet Take 300 mg by mouth 2 (two) times daily. At 0600 and 1700.   Yes [provider]  senna-docusate (SENOKOT-S) 8.6-50 MG tablet Take 1 tablet by mouth daily. 05/01/16  Yes Enid BaasKalisetti, Radhika, MD  urea (CARMOL) 40 % CREA Apply 1 application topically daily.   Yes [provider]  Nutritional Supplements (FEEDING SUPPLEMENT, NEPRO CARB STEADY,) LIQD Take 237 mLs by mouth daily. 05/01/16   Enid BaasKalisetti, Radhika, MD  Nutritional Supplements (NEPRO) LIQD Take 240 mLs by mouth daily.    [provider]  oxyCODONE-acetaminophen (PERCOCET/ROXICET) 5-325 MG tablet Take 1 tablet by mouth every 6 (six) hours as needed for moderate pain or severe pain. 05/01/16   Enid BaasKalisetti, Radhika, MD      VITAL SIGNS:  Blood pressure (!) 182/104, pulse 69, temperature 98.1 F (36.7 C), temperature source Oral, resp. rate (!) 21, weight 101.2 kg (223 lb), SpO2 99 %.  PHYSICAL EXAMINATION:  Physical Exam  GENERAL:  66 y.o.-year-old patient lying in the bed confused but in NAD.   EYES: Pupils equal, round, reactive to light and accommodation. No scleral icterus. Extraocular muscles intact.  HEENT: Head atraumatic, normocephalic. Oropharynx and nasopharynx clear. No oropharyngeal erythema, moist oral mucosa  NECK:  Supple, no jugular venous distention. No thyroid enlargement, no  tenderness.  LUNGS: Normal breath sounds bilaterally, no wheezing, rales, rhonchi. No use of accessory muscles of respiration.  CARDIOVASCULAR: S1, S2 RRR. No murmurs, rubs, gallops, clicks.  ABDOMEN: Soft, nontender, nondistended. Bowel sounds present. No organomegaly or mass.  EXTREMITIES: No pedal edema, cyanosis, or clubbing. + 2 pedal & radial pulses b/l.   NEUROLOGIC: Cranial nerves II through XII are intact. No focal Motor or sensory deficits appreciated b/l PSYCHIATRIC: The patient is alert and oriented x 1.   SKIN: No obvious rash, lesion, or ulcer.   Right upper Ext AV fistula with good bruit & thrill.   LABORATORY PANEL:   CBC Recent Labs  Lab 09/23/17 1625  WBC 4.1  HGB 11.2*  HCT 34.2*  PLT 137*   ------------------------------------------------------------------------------------------------------------------  Chemistries  Recent Labs  Lab 09/23/17 1625  NA 135  K 6.7*  CL 93*  CO2 20*  GLUCOSE 100*  BUN 161*  CREATININE 15.86*  CALCIUM 8.7*  AST 39  ALT 29  ALKPHOS 181*  BILITOT 1.0   ------------------------------------------------------------------------------------------------------------------  Cardiac Enzymes Recent Labs  Lab 09/23/17 1625  TROPONINI 0.15*   ------------------------------------------------------------------------------------------------------------------  RADIOLOGY:  Dg Chest Portable 1 View  Result Date: 09/23/2017 CLINICAL DATA:  Missed dialysis EXAM: PORTABLE CHEST 1 VIEW COMPARISON:  05/17/2016 FINDINGS: Cardiomegaly is stable. Low lung volumes. Vascular congestion. No Kerley B lines to suggest interstitial edema. No pneumothorax or pleural effusion. Stable left subclavian pacemaker device. IMPRESSION: Stable cardiomegaly and vascular congestion. Electronically Signed   By: Jolaine ClickArthur  Hoss M.D.   On: 09/23/2017 17:30     IMPRESSION AND PLAN:   66 year old male with past medical history of end-stage renal disease on  hemodialysis, seizures, bipolar disorder, mental retardation, secondary hyperparathyroidism, essential hypertension who presents to the hospital due to acute hyperkalemia.  1. Acute hyperkalemia-secondary to patient missing hemodialysis for the past week. -Patient will get urgent hemodialysis presently. Will follow potassium.  2. End-stage renal disease on hemodialysis-patient normally gets dialysis on Monday Wednesday and Friday. He has missed his dialysis for over a week. Nephrology has been consulted and plan for urgent hemodialysis today due to the acute hyperkalemia.  3. History of bipolar disorder-continue Trileptal, Seroquel, Haldol  4. History of seizures-no acute seizures presently. Continue Dilantin.  5. Essential hypertension-patient's blood pressure is somewhat uncontrolled due to patient missing hemodialysis for over a week. -Patient will get hemodialysis today and will follow hemodynamics. If needed will add some oral antihypertensives.  6. Secondary hyperparathyroidism-continue Sensipar, PhosLo.  7. Anxiety-continue Klonopin.  8. GERD-continue Protonix.   All the records are reviewed and case discussed with ED provider. Management plans discussed with the patient, family and they are in agreement.  CODE STATUS: Full code  TOTAL TIME TAKING CARE OF THIS PATIENT: 45 minutes.    Houston SirenSAINANI,Azreal Stthomas J M.D on 09/23/2017 at 6:55 PM  Between 7am to 6pm - Pager - [phone]  After 6pm go to [email or web address] - password [email or web address] [facility]  [facility]  Office  [phone]  CC: Primary care physician; Clinic, [facility]

## 2017-09-23 NOTE — Progress Notes (Signed)
Pre HD assessment  

## 2017-09-23 NOTE — Progress Notes (Signed)
HD tx start 

## 2017-09-23 NOTE — ED Notes (Signed)
Pt transported to dialysis

## 2017-09-23 NOTE — ED Triage Notes (Signed)
Pt ems from [facility]. Pt has been refusing dialysis since last Wednesday. Weak and nauseated today and decided to have dialysis. Dialysis center refused until checked out. Pt with MOST form

## 2017-09-23 NOTE — Progress Notes (Signed)
[facility]  ROUNDING NOTE   Subjective:   [name] admitted to [facility] on 09/23/2017 for Weakness  Patient has missed three outpatient hemodialysis treatments. He was then [facility] ED where he was found to be uremic with hyperkalemia.   Objective:  Vital signs in last 24 hours:  Temp:  [98.1 F (36.7 C)] 98.1 F (36.7 C) (11/28 1617) Pulse Rate:  [67-70] 69 (11/28 1930) Resp:  [19-36] 24 (11/28 1930) BP: (124-182)/(53-104) 124/98 (11/28 1930) SpO2:  [92 %-99 %] 92 % (11/28 1930) Weight:  [101.2 kg (223 lb)] 101.2 kg (223 lb) (11/28 1620)  Weight change:  Filed Weights   09/23/17 1620  Weight: 101.2 kg (223 lb)    Intake/Output: No intake/output data recorded.   Intake/Output this shift:  No intake/output data recorded.  Physical Exam: General: NAD,   Head: Normocephalic, atraumatic. Moist oral mucosal membranes  Eyes: Anicteric, PERRL  Neck: Supple, trachea midline  Lungs:  Clear to auscultation  Heart: Regular rate and rhythm  Abdomen:  Soft, nontender, obese  Extremities: trace peripheral edema.  Neurologic: Follows commands, unable to answer questions  Skin: No lesions  Access: Right AVG    Basic Metabolic Panel: Recent Labs  Lab 09/23/17 1625  NA 135  K 6.7*  CL 93*  CO2 20*  GLUCOSE 100*  BUN 161*  CREATININE 15.86*  CALCIUM 8.7*    Liver Function Tests: Recent Labs  Lab 09/23/17 1625  AST 39  ALT 29  ALKPHOS 181*  BILITOT 1.0  PROT 7.1  ALBUMIN 3.4*   No results for input(s): LIPASE, AMYLASE in the last 168 hours. No results for input(s): AMMONIA in the last 168 hours.  CBC: Recent Labs  Lab 09/23/17 1625  WBC 4.1  NEUTROABS 3.0  HGB 11.2*  HCT 34.2*  MCV 101.8*  PLT 137*    Cardiac Enzymes: Recent Labs  Lab 09/23/17 1625  TROPONINI 0.15*    BNP: Invalid input(s): POCBNP  CBG: No results for input(s): GLUCAP in the last 168 hours.  Microbiology: Results for orders placed or performed  during the hospital encounter of 04/26/16  MRSA PCR Screening     Status: Abnormal   Collection Time: 04/26/16  8:40 PM  Result Value Ref Range Status   MRSA by PCR (A) NEGATIVE Final    INVALID, UNABLE TO DETERMINE THE PRESENCE OF TARGET DNA DUE TO SPECIMEN INTEGRITY. RECOLLECTION REQUESTED.    Comment: CALLED DAWN SONGSTER AT 0022 ON 04/27/16  TO RECOLLECT AND REORDER RWW  MRSA PCR Screening     Status: Abnormal   Collection Time: 04/27/16  3:00 AM  Result Value Ref Range Status   MRSA by PCR (A) NEGATIVE Final    INVALID, UNABLE TO DETERMINE THE PRESENCE OF TARGET DNA DUE TO SPECIMEN INTEGRITY. RECOLLECTION REQUESTED.    Comment:        The GeneXpert MRSA Assay (FDA approved for NASAL specimens only), is one component of a comprehensive MRSA colonization surveillance program. It is not intended to diagnose MRSA infection nor to guide or monitor treatment for MRSA infections. REQUEST FOR RECOLLECT CALLED TO MICHAEL JACOBS AT 0734 ON 04/27/16.Marland Kitchen.Marland Kitchen.[facility]   MRSA PCR Screening     Status: Abnormal   Collection Time: 04/27/16  2:20 PM  Result Value Ref Range Status   MRSA by PCR (A) NEGATIVE Final    INVALID, UNABLE TO DETERMINE THE PRESENCE OF TARGET DNA DUE TO SPECIMEN INTEGRITY. RECOLLECTION REQUESTED.    Comment:  The GeneXpert MRSA Assay (FDA approved for NASAL specimens only), is one component of a comprehensive MRSA colonization surveillance program. It is not intended to diagnose MRSA infection nor to guide or monitor treatment for MRSA infections.   MRSA PCR Screening     Status: None   Collection Time: 04/27/16  4:03 PM  Result Value Ref Range Status   MRSA by PCR NEGATIVE NEGATIVE Final    Comment:        The GeneXpert MRSA Assay (FDA approved for NASAL specimens only), is one component of a comprehensive MRSA colonization surveillance program. It is not intended to diagnose MRSA infection nor to guide or monitor treatment for MRSA infections.      Coagulation Studies: No results for input(s): LABPROT, INR in the last 72 hours.  Urinalysis: No results for input(s): COLORURINE, LABSPEC, PHURINE, GLUCOSEU, HGBUR, BILIRUBINUR, KETONESUR, PROTEINUR, UROBILINOGEN, NITRITE, LEUKOCYTESUR in the last 72 hours.  Invalid input(s): APPERANCEUR    Imaging: Dg Chest Portable 1 View  Result Date: 09/23/2017 CLINICAL DATA:  Missed dialysis EXAM: PORTABLE CHEST 1 VIEW COMPARISON:  05/17/2016 FINDINGS: Cardiomegaly is stable. Low lung volumes. Vascular congestion. No Kerley B lines to suggest interstitial edema. No pneumothorax or pleural effusion. Stable left subclavian pacemaker device. IMPRESSION: Stable cardiomegaly and vascular congestion. Electronically Signed   By: Jolaine ClickArthur  Hoss M.D.   On: 09/23/2017 17:30     Medications:       Assessment/ Plan:  [name] is a 66 y.o. black male with end stage renal disease on hemodialysis, hypertension, mental retardation, seizure disorder, hypothyroidism.   [facility] [address] Right AVG EDW 93.5kg  1. End Stage Renal Disease: with hyperkalemia and uremia. Urgent dialysis tonight. Orders prepared. 1K bath. BFR 300 DFR 600 treatment time 2.5 hours. Patient significantly above his estimated dry weight. UF goal of 2.5-3 liters tonight.  Resume TTS schedule tomorrow.   2. Hypertension: blood pressure volume driven.  Currently does not take any bp medications at home.   3. Anemia of chronic kidney disease: hemoglobin 11.2 - Resume EPO on TTS schedule.   4. Secondary Hyperparathyroidism: outpatient labs on 11/13: PTH 760 - elevated, phosphorus 4.9.  Calcium at goal during this admission - Continue calcium acetate 2 tabs with each meal.    LOS: 0 [name] 11/28/20188:41 PM

## 2017-09-23 NOTE — ED Notes (Addendum)
Dr Scotty Courtstafford notified in person of troponin 0.15

## 2017-09-23 NOTE — ED Notes (Signed)
Pt's family left the ED.

## 2017-09-23 NOTE — ED Notes (Signed)
Pt went to Dialysis

## 2017-09-23 NOTE — ED Notes (Signed)
Gardiner Barefootathaniel (brother) [phone]

## 2017-09-23 NOTE — Progress Notes (Signed)
Post HD assessment  

## 2017-09-23 NOTE — ED Provider Notes (Signed)
[facility] Emergency Department Provider Note  ____________________________________________  Time seen: Approximately 5:49 PM  I have reviewed the triage vital signs and the nursing notes.   HISTORY  Chief Complaint Weakness  Level 5 Caveat: Portions of the History and Physical are unable to be obtained due to patient being a poor historian   HPI [name] Wiley is a 66 y.o. male sent to the ED for evaluation due to missing his dialysis over the past week. Reportedly patient has been refusing dialysis and so hasn't gone since November 19. He denies chest pain or shortness of breath or dizziness. He had agreed to go to dialysis today, but his Center requested he be evaluated in the ED first due to the likelihood of severe electrolyte abnormalities.     Past Medical History:  Diagnosis Date  . Anxiety   . CAD (coronary artery disease)   . Dialysis patient (HCC)   . GERD (gastroesophageal reflux disease)   . Hypertension   . Hypothyroid   . Intellectual disability   . Renal disorder   . Seizures [facility](HCC)      Patient Active Problem List   Diagnosis Date Noted  . 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 Hyperkalemia 04/26/2016  . Hyponatremia 04/26/2016  . Pancytopenia (HCC) 04/26/2016  . End stage renal disease (HCC) 04/26/2016  . 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 AV FISTULA PLACEMENT Right   . FISTULOGRAM Right 03/06/2015   Procedure: Fistulogram;  Surgeon: Renford DillsGregory G Schnier, MD;  Location: [facility] INVASIVE CV LAB;  Service: Cardiovascular;  Laterality: Right;  . INSERT / REPLACE / REMOVE PACEMAKER    . PERIPHERAL VASCULAR CATHETERIZATION N/A 05/01/2016   Procedure: A/V Shunt Intervention/declot, possible permcath placement;  Surgeon: Annice NeedyJason S Dew, MD;  Location: [facility] INVASIVE CV LAB;  Service: Cardiovascular;  Laterality: N/A;     Prior to Admission medications   Medication Sig Start  Date End Date Taking? Authorizing Provider  aspirin EC 81 MG tablet Take 81 mg by mouth daily.    [provider]  benztropine (COGENTIN) 1 MG tablet Take 1 mg by mouth 2 (two) times daily.    [provider]  calcium acetate (PHOSLO) 667 MG capsule Take 1,334 mg by mouth 3 (three) times daily with meals.    [provider]  cholecalciferol (VITAMIN D) 1000 units tablet Take 1,000 Units by mouth daily.    [provider]  cinacalcet (SENSIPAR) 30 MG tablet Take 90 mg by mouth daily. Take with lunch.    [provider]  clonazePAM (KLONOPIN) 0.5 MG tablet Take 1 tablet (0.5 mg total) by mouth 2 (two) times daily. *hold for sedation* 05/01/16   Enid BaasKalisetti, Radhika, MD  docusate sodium (COLACE) 100 MG capsule Take 1 capsule (100 mg total) by mouth 2 (two) times daily. 05/01/16   Enid BaasKalisetti, Radhika, MD  Ferrous Fumarate 324 (106 FE) MG TABS Take 1 tablet by mouth daily.    [provider]  folic acid (FOLVITE) 400 MCG tablet Take 400 mcg by mouth daily.     [provider]  haloperidol (HALDOL) 5 MG tablet Take 1 tablet (5 mg total) by mouth 3 (three) times a week. Pt takes on Tuesday, Thursday, and Saturday. 05/01/16   Enid BaasKalisetti, Radhika, MD  levothyroxine (SYNTHROID, LEVOTHROID) 300 MCG tablet Take 600 mcg by mouth daily before breakfast.    [provider]  metolazone (ZAROXOLYN) 2.5 MG tablet  Take 2.5 mg by mouth daily.    [provider]  multivitamin (RENA-VIT) TABS tablet Take 1 tablet by mouth daily.    [provider]  Nutritional Supplements (FEEDING SUPPLEMENT, NEPRO CARB STEADY,) LIQD Take 237 mLs by mouth daily. 05/01/16   Enid BaasKalisetti, Radhika, MD  Nutritional Supplements (NEPRO) LIQD Take 240 mLs by mouth daily.    [provider]  omega-3 acid ethyl esters (LOVAZA) 1 G capsule Take 1 g by mouth 2 (two) times daily.    [provider]  omeprazole (PRILOSEC OTC) 20 MG tablet Take 20 mg by mouth  daily.    [provider]  oxcarbazepine (TRILEPTAL) 600 MG tablet Take 600 mg by mouth 2 (two) times daily.    [provider]  oxyCODONE-acetaminophen (PERCOCET/ROXICET) 5-325 MG tablet Take 1 tablet by mouth every 6 (six) hours as needed for moderate pain or severe pain. 05/01/16   Enid BaasKalisetti, Radhika, MD  phenytoin (DILANTIN) 100 MG ER capsule Take 100-300 mg by mouth See admin instructions. Take 1 capsule by mouth at 6 am, and take 3 capsules (300 mg) by mouth every night at bedtime at 8 pm.    [provider]  QUEtiapine (SEROQUEL XR) 300 MG 24 hr tablet Take 300 mg by mouth 2 (two) times daily. At 0600 and 1700.    [provider]  senna-docusate (SENOKOT-S) 8.6-50 MG tablet Take 1 tablet by mouth daily. 05/01/16   Enid BaasKalisetti, Radhika, MD  sertraline (ZOLOFT) 50 MG tablet Take 50 mg by mouth daily.    [provider]     Allergies Patient has no known allergies.   Family History  Problem Relation Age of Onset  . Congestive Heart Failure Father     Social History Social History   Tobacco Use  . Smoking status: Never Smoker  . Smokeless tobacco: Never Used  Substance Use Topics  . Alcohol use: No  . Drug use: No    Review of Systems  Constitutional:   No fever or chills.   Cardiovascular:   No chest pain or syncope. Respiratory:   No dyspnea or cough. Gastrointestinal:   Negative for abdominal pain, vomiting and diarrhea.  Musculoskeletal:   Negative for focal pain or swelling All other systems reviewed and are negative except as documented above in ROS and HPI.  ____________________________________________   PHYSICAL EXAM:  VITAL SIGNS: ED Triage Vitals  Enc Vitals Group     BP 09/23/17 1617 (!) 165/88     Pulse Rate 09/23/17 1617 70     Resp --      Temp 09/23/17 1617 98.1 F (36.7 C)     Temp Source 09/23/17 1617 Oral     SpO2 09/23/17 1617 96 %     Weight 09/23/17 1620 223 lb (101.2 kg)     Height --      Head  Circumference --      [facility] Flow --      Pain Score --      Pain Loc --      Pain Edu? --      Excl. in GC? --     Vital signs reviewed, nursing assessments reviewed.   Constitutional:   Alert and oriented. Not in distress. Eyes:   No scleral icterus.  EOMI. No nystagmus. No conjunctival pallor. PERRL. ENT   Head:   Normocephalic and atraumatic.   Nose:   No congestion/rhinnorhea.    Mouth/Throat:   MMM, no pharyngeal erythema. No peritonsillar mass.  Neck:   No meningismus. Full ROM. Hematological/Lymphatic/Immunilogical:   No cervical lymphadenopathy. Cardiovascular:   RRR. Symmetric bilateral radial and DP pulses.  No murmurs. AV fistula with thrill on the right upper arm  Respiratory:   Normal respiratory effort without tachypnea/retractions. Breath sounds are clear and equal bilaterally. No wheezes/rales/rhonchi. Gastrointestinal:   Soft and nontender. Non distended. There is no CVA tenderness.  No rebound, rigidity, or guarding. Genitourinary:   deferred Musculoskeletal:   Normal range of motion in all extremities. No joint effusions.  No lower extremity tenderness.  No edema. Neurologic:   Dysarthric, incoherent speech.  Motor grossly intact. No gross focal neurologic deficits are appreciated.  Skin:    Skin is warm, dry and intact. No rash noted.  No petechiae, purpura, or bullae.  ____________________________________________    LABS (pertinent positives/negatives) (all labs ordered are listed, but only abnormal results are displayed) Labs Reviewed  COMPREHENSIVE METABOLIC PANEL - Abnormal; Notable for the following components:      Result Value   Potassium 6.7 (*)    Chloride 93 (*)    CO2 20 (*)    Glucose, Bld 100 (*)    BUN 161 (*)    Creatinine, Ser 15.86 (*)    Calcium 8.7 (*)    Albumin 3.4 (*)    Alkaline Phosphatase 181 (*)    GFR calc non Af Amer 3 (*)    GFR calc Af Amer 3 (*)    Anion gap 22 (*)    All other components within normal  limits  CBC WITH DIFFERENTIAL/PLATELET - Abnormal; Notable for the following components:   RBC 3.36 (*)    Hemoglobin 11.2 (*)    HCT 34.2 (*)    MCV 101.8 (*)    RDW 16.7 (*)    Platelets 137 (*)    Lymphs Abs 0.6 (*)    All other components within normal limits  TROPONIN I - Abnormal; Notable for the following components:   Troponin I 0.15 (*)    All other components within normal limits   ____________________________________________   EKG  intepreted by me AV paced. Left axis, lbbb, wide qrs, peaked T waves.  Compared to July 2017, the left bundle branch block and wide QRS are new.  ____________________________________________    RADIOLOGY  Dg Chest Portable 1 View  Result Date: 09/23/2017 CLINICAL DATA:  Missed dialysis EXAM: PORTABLE CHEST 1 VIEW COMPARISON:  05/17/2016 FINDINGS: Cardiomegaly is stable. Low lung volumes. Vascular congestion. No Kerley B lines to suggest interstitial edema. No pneumothorax or pleural effusion. Stable left subclavian pacemaker device. IMPRESSION: Stable cardiomegaly and vascular congestion. Electronically Signed   By: Jolaine Click M.D.   On: 09/23/2017 17:30    ____________________________________________   PROCEDURES Procedures CRITICAL CARE Performed by: Scotty Court, Alvaro Aungst   Total critical care time: 35 minutes  Critical care time was exclusive of separately billable procedures and treating other patients.  Critical care was necessary to treat or prevent imminent or life-threatening deterioration.  Critical care was time spent personally by me on the following activities: development of treatment plan with patient and/or surrogate as well as nursing, discussions with consultants, evaluation of patient's response to treatment, examination of patient, obtaining history from patient or surrogate, ordering and performing treatments and interventions, ordering and review of laboratory studies, ordering and review of radiographic  studies, pulse oximetry and re-evaluation of patient's condition.  Peripheral IV insertion by physician Indication: Multiple failed attempts by nursing staff, need for IV access and/or blood  samples for workup Performed under continuous real-time ultrasound visualization Area cleaned with chlorhexidine. 20-gauge IV successfully placed in the left forearm 2 attempts, no complications, EBL 0.   ____________________________________________     CLINICAL IMPRESSION / ASSESSMENT AND PLAN / ED COURSE  Pertinent labs & imaging results that were available during my care of the patient were reviewed by me and considered in my medical decision making (see chart for details).  Patient presents for evaluation after missing his dialysis sessions over the past week. Unable to ascertain much further history due to the patient's severe dysarthria and incoherent responses. High concern for hyperkalemia with ekg changes. Will obtain iv access asap. Likely admission.   Clinical Course as of Sep 23 1758  Wed Sep 23, 2017  1650 [place] piv placed by me. Immediate calcium, bicarb while waiting for labs due to high suspicion of hyperkalemia.   [PS]  1747 D/w [place]. Will arrange HD.   [PS]  1753 With clinical finding, confirms critical hyperkalemia. Elevated BUN may be contributed to mental status, causing encephalopathy. Potassium: (!!) 6.7 [PS]  1753 No reported chest pain, dyspnea. Ekg not diagnostic. Likely due to esrd. Defer to inpatient team.  Troponin I: (!!) 0.15 [PS]    Clinical Course User Index [PS] Sharman Cheek, MD     ____________________________________________   FINAL CLINICAL IMPRESSION(S) / ED DIAGNOSES    Final diagnoses:  Hyperkalemia  Uremia  Encephalopathy  End-stage renal disease on hemodialysis (HCC)      This SmartLink is deprecated. Use AVSMEDLIST instead to display the medication list for a patient.   Portions of this note were generated with dragon dictation  software. Dictation errors may occur despite best attempts at proofreading.    Sharman Cheek, MD 09/23/17 [phone]

## 2017-09-23 NOTE — Progress Notes (Signed)
HD tx end  

## 2017-09-24 LAB — BASIC METABOLIC PANEL
Anion gap: 21 — ABNORMAL HIGH (ref 5–15)
BUN: 107 mg/dL — AB (ref 6–20)
CALCIUM: 8.5 mg/dL — AB (ref 8.9–10.3)
CO2: 21 mmol/L — AB (ref 22–32)
CREATININE: 11.14 mg/dL — AB (ref 0.61–1.24)
Chloride: 97 mmol/L — ABNORMAL LOW (ref 101–111)
GFR calc Af Amer: 5 mL/min — ABNORMAL LOW (ref >60)
GFR calc non Af Amer: 4 mL/min — ABNORMAL LOW (ref >60)
GLUCOSE: 56 mg/dL — AB (ref 65–99)
Potassium: 5.9 mmol/L — ABNORMAL HIGH (ref 3.5–5.1)
Sodium: 139 mmol/L (ref 135–145)

## 2017-09-24 LAB — CBC
HCT: 31.6 % — ABNORMAL LOW (ref 40.0–52.0)
HEMOGLOBIN: 10.5 g/dL — AB (ref 13.0–18.0)
MCH: 33.8 pg (ref 26.0–34.0)
MCHC: 33.1 g/dL (ref 32.0–36.0)
MCV: 102.2 fL — ABNORMAL HIGH (ref 80.0–100.0)
PLATELETS: 100 10*3/uL — AB (ref 150–440)
RBC: 3.1 MIL/uL — ABNORMAL LOW (ref 4.40–5.90)
RDW: 17.2 % — AB (ref 11.5–14.5)
WBC: 2.9 10*3/uL — ABNORMAL LOW (ref 3.8–10.6)

## 2017-09-24 LAB — GLUCOSE, CAPILLARY
GLUCOSE-CAPILLARY: 52 mg/dL — AB (ref 65–99)
GLUCOSE-CAPILLARY: 80 mg/dL (ref 65–99)
Glucose-Capillary: 124 mg/dL — ABNORMAL HIGH (ref 65–99)

## 2017-09-24 LAB — MRSA PCR SCREENING: MRSA by PCR: NEGATIVE

## 2017-09-24 MED ORDER — PHENYTOIN SODIUM EXTENDED 100 MG PO CAPS
100.0000 mg | ORAL_CAPSULE | Freq: Every day | ORAL | Status: DC
  Administered 2017-09-24 – 2017-09-25 (×2): 100 mg via ORAL
  Filled 2017-09-24 (×3): qty 1

## 2017-09-24 MED ORDER — METOLAZONE 2.5 MG PO TABS
2.5000 mg | ORAL_TABLET | Freq: Every day | ORAL | Status: DC
  Administered 2017-09-24: 2.5 mg via ORAL
  Filled 2017-09-24 (×2): qty 1

## 2017-09-24 MED ORDER — HEPARIN SODIUM (PORCINE) 5000 UNIT/ML IJ SOLN
5000.0000 [IU] | Freq: Three times a day (TID) | INTRAMUSCULAR | Status: DC
  Administered 2017-09-24 – 2017-09-25 (×3): 5000 [IU] via SUBCUTANEOUS
  Filled 2017-09-24 (×3): qty 1

## 2017-09-24 MED ORDER — DEXTROSE 50 % IV SOLN
25.0000 mL | Freq: Once | INTRAVENOUS | Status: AC
  Administered 2017-09-24: 25 mL via INTRAVENOUS
  Filled 2017-09-24: qty 50

## 2017-09-24 MED ORDER — ACETAMINOPHEN 325 MG PO TABS: 650.0000 mg | ORAL_TABLET | Freq: Four times a day (QID) | ORAL | Status: DC | PRN

## 2017-09-24 MED ORDER — ACETAMINOPHEN 650 MG RE SUPP: 650.0000 mg | Freq: Four times a day (QID) | RECTAL | Status: DC | PRN

## 2017-09-24 MED ORDER — OMEPRAZOLE MAGNESIUM 20 MG PO TBEC: 20.0000 mg | DELAYED_RELEASE_TABLET | Freq: Every day | ORAL | Status: DC

## 2017-09-24 MED ORDER — SENNOSIDES-DOCUSATE SODIUM 8.6-50 MG PO TABS
1.0000 | ORAL_TABLET | Freq: Every day | ORAL | Status: DC
  Administered 2017-09-24: 1 via ORAL
  Filled 2017-09-24: qty 1

## 2017-09-24 MED ORDER — PANTOPRAZOLE SODIUM 40 MG PO TBEC
40.0000 mg | DELAYED_RELEASE_TABLET | Freq: Every day | ORAL | Status: DC
  Administered 2017-09-24: 40 mg via ORAL
  Filled 2017-09-24: qty 1

## 2017-09-24 MED ORDER — RENA-VITE PO TABS
1.0000 | ORAL_TABLET | Freq: Every day | ORAL | Status: DC
  Administered 2017-09-24: 1 via ORAL
  Filled 2017-09-24: qty 1

## 2017-09-24 MED ORDER — LEVOTHYROXINE SODIUM 100 MCG PO TABS
600.0000 ug | ORAL_TABLET | Freq: Every day | ORAL | Status: DC
Start: 2017-09-24 — End: 2017-09-25
  Administered 2017-09-24 – 2017-09-25 (×2): 600 ug via ORAL
  Filled 2017-09-24 (×2): qty 6

## 2017-09-24 MED ORDER — EPOETIN ALFA 10000 UNIT/ML IJ SOLN: 4000.0000 [IU] | INTRAMUSCULAR | Status: DC

## 2017-09-24 MED ORDER — NEPRO/CARBSTEADY PO LIQD
237.0000 mL | Freq: Every day | ORAL | Status: DC
  Administered 2017-09-24: 237 mL via ORAL

## 2017-09-24 MED ORDER — FERROUS FUMARATE 324 (106 FE) MG PO TABS
1.0000 | ORAL_TABLET | Freq: Every day | ORAL | Status: DC
  Administered 2017-09-24: 106 mg via ORAL
  Filled 2017-09-24 (×2): qty 1

## 2017-09-24 MED ORDER — HALOPERIDOL 5 MG PO TABS: 5.0000 mg | ORAL_TABLET | ORAL | Status: DC

## 2017-09-24 MED ORDER — PHENYTOIN SODIUM EXTENDED 100 MG PO CAPS: 100.0000 mg | ORAL_CAPSULE | ORAL | Status: DC

## 2017-09-24 MED ORDER — CINACALCET HCL 30 MG PO TABS
120.0000 mg | ORAL_TABLET | Freq: Every day | ORAL | Status: DC
  Administered 2017-09-24: 120 mg via ORAL
  Filled 2017-09-24 (×2): qty 4

## 2017-09-24 MED ORDER — CALCIUM ACETATE (PHOS BINDER) 667 MG PO CAPS
1334.0000 mg | ORAL_CAPSULE | Freq: Three times a day (TID) | ORAL | Status: DC
  Administered 2017-09-24 – 2017-09-25 (×3): 1334 mg via ORAL
  Filled 2017-09-24 (×3): qty 2

## 2017-09-24 MED ORDER — DOCUSATE SODIUM 100 MG PO CAPS
100.0000 mg | ORAL_CAPSULE | Freq: Two times a day (BID) | ORAL | Status: DC
  Administered 2017-09-24 (×2): 100 mg via ORAL
  Filled 2017-09-24 (×2): qty 1

## 2017-09-24 MED ORDER — FOLIC ACID 1 MG PO TABS
0.5000 mg | ORAL_TABLET | Freq: Every day | ORAL | Status: DC
  Administered 2017-09-24: 0.5 mg via ORAL
  Filled 2017-09-24: qty 1

## 2017-09-24 MED ORDER — PHENYTOIN SODIUM EXTENDED 100 MG PO CAPS
300.0000 mg | ORAL_CAPSULE | Freq: Every day | ORAL | Status: DC
  Administered 2017-09-24: 300 mg via ORAL
  Filled 2017-09-24: qty 3

## 2017-09-24 MED ORDER — NEPRO PO LIQD
240.0000 mL | Freq: Every day | ORAL | Status: DC
  Administered 2017-09-24: 240 mL via ORAL
  Filled 2017-09-24 (×2): qty 1

## 2017-09-24 MED ORDER — ONDANSETRON HCL 4 MG PO TABS
4.0000 mg | ORAL_TABLET | Freq: Four times a day (QID) | ORAL | Status: DC | PRN
Start: 2017-09-24 — End: 2017-09-25

## 2017-09-24 MED ORDER — CLONAZEPAM 0.5 MG PO TABS
0.5000 mg | ORAL_TABLET | Freq: Two times a day (BID) | ORAL | Status: DC
  Administered 2017-09-24 (×2): 0.5 mg via ORAL
  Filled 2017-09-24 (×2): qty 1

## 2017-09-24 MED ORDER — OMEGA-3-ACID ETHYL ESTERS 1 G PO CAPS
1.0000 g | ORAL_CAPSULE | Freq: Two times a day (BID) | ORAL | Status: DC
  Administered 2017-09-24 (×2): 1 g via ORAL
  Filled 2017-09-24 (×2): qty 1

## 2017-09-24 MED ORDER — OXYCODONE-ACETAMINOPHEN 5-325 MG PO TABS: 1.0000 | ORAL_TABLET | Freq: Four times a day (QID) | ORAL | Status: DC | PRN

## 2017-09-24 MED ORDER — OXCARBAZEPINE 300 MG PO TABS
600.0000 mg | ORAL_TABLET | Freq: Two times a day (BID) | ORAL | Status: DC
  Administered 2017-09-24 (×2): 600 mg via ORAL
  Filled 2017-09-24 (×3): qty 2

## 2017-09-24 MED ORDER — FOLIC ACID 400 MCG PO TABS: 400.0000 ug | ORAL_TABLET | Freq: Every day | ORAL | Status: DC

## 2017-09-24 MED ORDER — HALOPERIDOL 5 MG PO TABS
5.0000 mg | ORAL_TABLET | ORAL | Status: DC
  Administered 2017-09-24: 5 mg via ORAL
  Filled 2017-09-24: qty 1

## 2017-09-24 MED ORDER — ONDANSETRON HCL 4 MG/2ML IJ SOLN: 4.0000 mg | Freq: Four times a day (QID) | INTRAMUSCULAR | Status: DC | PRN

## 2017-09-24 MED ORDER — VITAMIN D 1000 UNITS PO TABS
1000.0000 [IU] | ORAL_TABLET | Freq: Every day | ORAL | Status: DC
  Administered 2017-09-24: 1000 [IU] via ORAL
  Filled 2017-09-24: qty 1

## 2017-09-24 MED ORDER — QUETIAPINE FUMARATE ER 300 MG PO TB24
300.0000 mg | ORAL_TABLET | Freq: Two times a day (BID) | ORAL | Status: DC
  Administered 2017-09-24 (×2): 300 mg via ORAL
  Filled 2017-09-24 (×4): qty 1

## 2017-09-24 NOTE — Care Management (Signed)
[name] Wiley HD liaison notified of admission.  

## 2017-09-24 NOTE — Progress Notes (Signed)
Pre HD assessment  

## 2017-09-24 NOTE — Progress Notes (Signed)
HD tx start 

## 2017-09-24 NOTE — Progress Notes (Signed)
Post HD assessment  

## 2017-09-24 NOTE — Clinical Social Work Note (Signed)
Clinical Social Work Assessment  Patient Details  Name: [name] Wiley MRN: [id number] Date of Birth: 05/07/1951  Date of referral:  09/24/17               Reason for consult:  Discharge Planning                Permission sought to share information with:    Permission granted to share information::     Name::        Agency::     Relationship::     Contact Information:     Housing/Transportation Living arrangements for the past 2 months:  Skilled Nursing Facility Source of Information:  Facility Patient Interpreter Needed:  None Criminal Activity/Legal Involvement Pertinent to Current Situation/Hospitalization:  No - Comment as needed Significant Relationships:  Siblings Lives with:  Facility Resident Do you feel safe going back to the place where you live?    Need for family participation in patient care:     Care giving concerns:  Patient is long term care at [facility].   Social Worker assessment / plan:  Patient is known to this CSW as he is chronic dialysis and is a long term care resident at [facility]. Patient has not had an admission in a while and has come to the hospital due to lack of receiving dialysis as he had been recently refusing. Patient is cognitively disabled and his brother is very involved. Joseph at [facility] states patient can return when time. Patient's brother sometimes transports him back to [facility].  Employment status:  Disabled (Comment on whether or not currently receiving Disability) Insurance information:  Medicare, Medicaid In [employer] [employer] [place] Recommendations:  Skilled Nursing Facility Information / Referral to community resources:     Patient/Family's Response to care:  Patient's brother came to visit for a while this morning with patient.  Patient/Family's Understanding of and Emotional Response to Diagnosis, Current Treatment, and Prognosis:  Patient's brother remains very involved with patient's treatment plan.   Emotional Assessment Appearance:  Appears  stated age Attitude/Demeanor/Rapport:    Affect (typically observed):    Orientation:  Oriented to Self Alcohol / Substance use:  Not Applicable Psych involvement (Current and /or in the community):  No (Comment)  Discharge Needs  Concerns to be addressed:  No discharge needs identified Readmission within the last 30 days:  No Current discharge risk:  None Barriers to Discharge:  No Barriers Identified   York SpanielMonica Shany Marinez, LCSW 09/24/2017, 4:12 PM

## 2017-09-24 NOTE — NC FL2 (Signed)
[place] MEDICAID FL2 LEVEL OF CARE SCREENING TOOL     IDENTIFICATION  Patient Name: [name] Wiley Birthdate: 04/20/1951 Sex: male Admission Date (Current Location): 09/23/2017  [facility] [place]   Facility and Address:  [facility], [address], [place], [place] [address]      Provider Number: [id number]  Attending Physician Name and Address:  Shaune Pollackhen, Qing, MD  Relative Name and Phone Number:       Current Level of Care: Hospital Recommended Level of Care: Skilled Nursing Facility Prior Approval Number:    Date Approved/Denied:   PASRR Number:    Discharge Plan: SNF    Current Diagnoses: Patient Active Problem List   Diagnosis Date Noted  . 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 Hyperkalemia 04/26/2016  . Hyponatremia 04/26/2016  . Pancytopenia (HCC) 04/26/2016  . End stage renal disease (HCC) 04/26/2016  . 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    Orientation RESPIRATION BLADDER Height & Weight     Self, Place  Normal Continent Weight: 216 lb 0.8 oz (98 kg) Height:  5\' 7"  (170.2 cm)  BEHAVIORAL SYMPTOMS/MOOD NEUROLOGICAL BOWEL NUTRITION STATUS  (none) Convulsions/Seizures Continent Diet(renal)  AMBULATORY STATUS COMMUNICATION OF NEEDS Skin   Limited Assist Verbally Normal                       Personal Care Assistance Level of Assistance  Bathing, Dressing Bathing Assistance: Maximum assistance   Dressing Assistance: Maximum assistance     Functional Limitations Info  Speech     Speech Info: Impaired    SPECIAL CARE FACTORS FREQUENCY   Outpatient HD                    Contractures Contractures Info: Not present    Additional Factors Info                  Current Medications (09/24/2017):  This is the current hospital active medication list Current Facility-Administered Medications  Medication Dose Route Frequency Provider Last Rate Last Dose  . acetaminophen (TYLENOL)  tablet 650 mg  650 mg Oral Q6H PRN [place] SirenSainani, Vivek J, MD       Or  . acetaminophen (TYLENOL) suppository 650 mg  650 mg Rectal Q6H PRN [place] SirenSainani, Vivek J, MD      . calcium acetate (PHOSLO) capsule 1,334 mg  1,334 mg Oral TID WC Houston SirenSainani, Vivek J, MD   1,334 mg at 09/24/17 0825  . cholecalciferol (VITAMIN D) tablet 1,000 Units  1,000 Units Oral Daily [place] SirenSainani, Vivek J, MD   1,000 Units at 09/24/17 1009  . cinacalcet (SENSIPAR) tablet 120 mg  120 mg Oral Daily [place] SirenSainani, Vivek J, MD   120 mg at 09/24/17 1009  . clonazePAM (KLONOPIN) tablet 0.5 mg  0.5 mg Oral BID [place] SirenSainani, Vivek J, MD   0.5 mg at 09/24/17 1009  . docusate sodium (COLACE) capsule 100 mg  100 mg Oral BID [place] SirenSainani, Vivek J, MD   100 mg at 09/24/17 1009  . feeding supplement (NEPRO CARB STEADY) liquid 237 mL  237 mL Oral Daily [place] SirenSainani, Vivek J, MD   237 mL at 09/24/17 1008  . Ferrous Fumarate (HEMOCYTE - 106 mg FE) tablet 106 mg of iron  1 tablet Oral Daily [place] SirenSainani, Vivek J, MD   106 mg of iron at 09/24/17 1009  . folic acid (FOLVITE) tablet 0.5 mg  0.5 mg Oral Daily Sainani, Rolly PancakeVivek J, MD   0.5  mg at 09/24/17 1008  . haloperidol (HALDOL) tablet 5 mg  5 mg Oral Once per day on Tue Thu Sat Shaune Pollackhen, Qing, MD      . heparin injection 5,000 Units  5,000 Units Subcutaneous Q8H [place] SirenSainani, Vivek J, MD   5,000 Units at 09/24/17 0507  . levothyroxine (SYNTHROID, LEVOTHROID) tablet 600 mcg  600 mcg Oral QAC breakfast Houston SirenSainani, Vivek J, MD   600 mcg at 09/24/17 0825  . metolazone (ZAROXOLYN) tablet 2.5 mg  2.5 mg Oral Daily [place] SirenSainani, Vivek J, MD   2.5 mg at 09/24/17 1008  . multivitamin (RENA-VIT) tablet 1 tablet  1 tablet Oral Daily [place] SirenSainani, Vivek J, MD   1 tablet at 09/24/17 1009  . NEPRO LIQD 240 mL  240 mL Oral Daily [place] SirenSainani, Vivek J, MD   240 mL at 09/24/17 1044  . omega-3 acid ethyl esters (LOVAZA) capsule 1 g  1 g Oral BID [place] SirenSainani, Vivek J, MD   1 g at 09/24/17 1008  . ondansetron (ZOFRAN) tablet 4 mg  4 mg Oral Q6H PRN [place] SirenSainani, Vivek J, MD       Or  .  ondansetron (ZOFRAN) injection 4 mg  4 mg Intravenous Q6H PRN [place] SirenSainani, Vivek J, MD      . Oxcarbazepine (TRILEPTAL) tablet 600 mg  600 mg Oral BID Houston SirenSainani, Vivek J, MD   600 mg at 09/24/17 1010  . oxyCODONE-acetaminophen (PERCOCET/ROXICET) 5-325 MG per tablet 1 tablet  1 tablet Oral Q6H PRN [place] SirenSainani, Vivek J, MD      . pantoprazole (PROTONIX) EC tablet 40 mg  40 mg Oral Daily [place] SirenSainani, Vivek J, MD   40 mg at 09/24/17 1009  . phenytoin (DILANTIN) ER capsule 100 mg  100 mg Oral Q0600 [place] SirenSainani, Vivek J, MD   100 mg at 09/24/17 0601  . phenytoin (DILANTIN) ER capsule 300 mg  300 mg Oral QHS Sainani, Rolly PancakeVivek J, MD      . QUEtiapine (SEROQUEL XR) 24 hr tablet 300 mg  300 mg Oral BID [place] SirenSainani, Vivek J, MD   300 mg at 09/24/17 1009  . senna-docusate (Senokot-S) tablet 1 tablet  1 tablet Oral Daily [place] SirenSainani, Vivek J, MD   1 tablet at 09/24/17 1009     Discharge Medications: Please see discharge summary for a list of discharge medications.  Relevant Imaging Results:  Relevant Lab Results:   Additional Information    York SpanielMonica Maciah Schweigert, LCSW

## 2017-09-24 NOTE — Progress Notes (Signed)
Chaplain received an OR for a pt who requested for a Bible. [place] met with pt and his brother, Winferd Humphrey, in the [place]. Pt told [place] he wanted to read the Bible. CH gave a Bible to pt. Pt was very pleased to received a Bible but when his brother talked about pt refusing to have dialysis; pt got upset and said he was fine. [place] listened to pt's concerns and offered a ministry of presence. [place] to follow up pt as needed.    09/24/17 1000  Clinical Encounter Type  Visited With Patient;Patient and family together  Visit Type Initial;Spiritual support;Other (Comment)  Referral From Nurse  Consult/Referral To [place] text;Prayer

## 2017-09-24 NOTE — Progress Notes (Signed)
[facility]  ROUNDING NOTE   Subjective:   Seen and examined on hemodialysis. Tolerating treatment well.   Emergent hemodialysis treatment last night. UF of 2.5 liters.   Objective:  Vital signs in last 24 hours:  Temp:  [97.3 F (36.3 C)-98.5 F (36.9 C)] 97.3 F (36.3 C) (11/29 1030) Pulse Rate:  [66-83] 83 (11/29 1430) Resp:  [15-36] 18 (11/29 1430) BP: (93-182)/(35-104) 161/100 (11/29 1430) SpO2:  [92 %-100 %] 96 % (11/29 1430) Weight:  [95.6 kg (210 lb 11.2 oz)-101.2 kg (223 lb)] 98 kg (216 lb 0.8 oz) (11/29 1030)  Weight change:  Filed Weights   09/24/17 0200 09/24/17 0500 09/24/17 1030  Weight: 97.6 kg (215 lb 3.2 oz) 95.6 kg (210 lb 11.2 oz) 98 kg (216 lb 0.8 oz)    Intake/Output: I/O last 3 completed shifts: In: 0  Out: 2522 [Other:2522]   Intake/Output this shift:  No intake/output data recorded.  Physical Exam: General: NAD,   Head: Normocephalic, atraumatic. Moist oral mucosal membranes  Eyes: Anicteric, PERRL  Neck: Supple, trachea midline  Lungs:  Clear to auscultation  Heart: Regular rate and rhythm  Abdomen:  Soft, nontender, obese  Extremities: No peripheral edema.  Neurologic: Follows commands, unable to answer questions  Skin: No lesions  Access: Right AVG    Basic Metabolic Panel: Recent Labs  Lab 09/23/17 1625 09/24/17 0458  NA 135 139  K 6.7* 5.9*  CL 93* 97*  CO2 20* 21*  GLUCOSE 100* 56*  BUN 161* 107*  CREATININE 15.86* 11.14*  CALCIUM 8.7* 8.5*    Liver Function Tests: Recent Labs  Lab 09/23/17 1625  AST 39  ALT 29  ALKPHOS 181*  BILITOT 1.0  PROT 7.1  ALBUMIN 3.4*   No results for input(s): LIPASE, AMYLASE in the last 168 hours. No results for input(s): AMMONIA in the last 168 hours.  CBC: Recent Labs  Lab 09/23/17 1625 09/24/17 0458  WBC 4.1 2.9*  NEUTROABS 3.0  --   HGB 11.2* 10.5*  HCT 34.2* 31.6*  MCV 101.8* 102.2*  PLT 137* 100*    Cardiac Enzymes: Recent Labs  Lab 09/23/17 1625   TROPONINI 0.15*    BNP: Invalid input(s): POCBNP  CBG: Recent Labs  Lab 09/24/17 0754 09/24/17 0850  GLUCAP 52* 80    Microbiology: Results for orders placed or performed during the hospital encounter of 09/23/17  MRSA PCR Screening     Status: None   Collection Time: 09/24/17  2:02 AM  Result Value Ref Range Status   MRSA by PCR NEGATIVE NEGATIVE Final    Comment:        The GeneXpert MRSA Assay (FDA approved for NASAL specimens only), is one component of a comprehensive MRSA colonization surveillance program. It is not intended to diagnose MRSA infection nor to guide or monitor treatment for MRSA infections.     Coagulation Studies: No results for input(s): LABPROT, INR in the last 72 hours.  Urinalysis: No results for input(s): COLORURINE, LABSPEC, PHURINE, GLUCOSEU, HGBUR, BILIRUBINUR, KETONESUR, PROTEINUR, UROBILINOGEN, NITRITE, LEUKOCYTESUR in the last 72 hours.  Invalid input(s): APPERANCEUR    Imaging: Dg Chest Portable 1 View  Result Date: 09/23/2017 CLINICAL DATA:  Missed dialysis EXAM: PORTABLE CHEST 1 VIEW COMPARISON:  05/17/2016 FINDINGS: Cardiomegaly is stable. Low lung volumes. Vascular congestion. No Kerley B lines to suggest interstitial edema. No pneumothorax or pleural effusion. Stable left subclavian pacemaker device. IMPRESSION: Stable cardiomegaly and vascular congestion. Electronically Signed   By: Jolaine ClickArthur  Hoss  M.D.   On: 09/23/2017 17:30     Medications:       Assessment/ Plan:  [name] is a 66 y.o. black male with end stage renal disease on hemodialysis, hypertension, mental retardation, seizure disorder, hypothyroidism.   [facility] Right AVG EDW 93.5kg  1. End Stage Renal Disease: with hyperkalemia and uremia on admission. Urgent dialysis on admission. Hyperkalemia today as well.  Seen and examined on hemodialysis today. Tolerating treatment. Resume TTS schedule.   2. Hypertension: blood pressure  volume driven.  Currently does not take any bp medications at home.   3. Anemia of chronic kidney disease: hemoglobin 10.5 - EPO with HD treatment  4. Secondary Hyperparathyroidism: outpatient labs on 11/13: PTH 760 - elevated, phosphorus 4.9.  Calcium at goal during this admission - Continue calcium acetate 2 tabs with each meal.    LOS: 1 Caroline Matters 11/29/20182:46 PM

## 2017-09-24 NOTE — Progress Notes (Signed)
HD tx end  

## 2017-09-24 NOTE — Progress Notes (Addendum)
[facility] - [place] [facility]   PATIENT NAME: [name] Wiley    MR#:  [id number]  DATE OF BIRTH:  10/07/1951  SUBJECTIVE:  CHIEF COMPLAINT:   Chief Complaint  Patient presents with  . Weakness   No complaints. REVIEW OF SYSTEMS:  Review of Systems  Unable to perform ROS: Mental acuity    DRUG ALLERGIES:  No Known Allergies VITALS:  Blood pressure 137/60, pulse 82, temperature 98.1 F (36.7 C), temperature source Oral, resp. rate 19, height 5\' 7"  (1.702 m), weight 216 lb 0.8 oz (98 kg), SpO2 96 %. PHYSICAL EXAMINATION:  Physical Exam  Constitutional: He is well-developed, well-nourished, and in no distress.  HENT:  Head: Normocephalic.  Mouth/Throat: Oropharynx is clear and moist.  Eyes: Conjunctivae and EOM are normal. Pupils are equal, round, and reactive to light. No scleral icterus.  Neck: Normal range of motion. Neck supple. No JVD present. No tracheal deviation present.  Cardiovascular: Normal rate, regular rhythm and normal heart sounds. Exam reveals no gallop.  No murmur heard. Pulmonary/Chest: Effort normal and breath sounds normal. No respiratory distress. He has no wheezes. He has no rales.  Abdominal: Soft. Bowel sounds are normal. He exhibits no distension. There is no tenderness. There is no rebound.  Musculoskeletal: Normal range of motion. He exhibits no edema or tenderness.  Neurological: No cranial nerve deficit.  MR.  Skin: No rash noted. No erythema.  Psychiatric: Affect normal.   LABORATORY PANEL:  Male CBC Recent Labs  Lab 09/24/17 0458  WBC 2.9*  HGB 10.5*  HCT 31.6*  PLT 100*   ------------------------------------------------------------------------------------------------------------------ Chemistries  Recent Labs  Lab 09/23/17 1625 09/24/17 0458  NA 135 139  K 6.7* 5.9*  CL 93* 97*  CO2 20* 21*  GLUCOSE 100* 56*  BUN 161* 107*  CREATININE 15.86* 11.14*  CALCIUM 8.7* 8.5*  AST 39  --   ALT 29  --     ALKPHOS 181*  --   BILITOT 1.0  --    RADIOLOGY:  Dg Chest Portable 1 View  Result Date: 09/23/2017 CLINICAL DATA:  Missed dialysis EXAM: PORTABLE CHEST 1 VIEW COMPARISON:  05/17/2016 FINDINGS: Cardiomegaly is stable. Low lung volumes. Vascular congestion. No Kerley B lines to suggest interstitial edema. No pneumothorax or pleural effusion. Stable left subclavian pacemaker device. IMPRESSION: Stable cardiomegaly and vascular congestion. Electronically Signed   By: Jolaine ClickArthur  Hoss M.D.   On: 09/23/2017 17:30   ASSESSMENT AND PLAN:   66 year old male with past medical history of end-stage renal disease on hemodialysis, seizures, bipolar disorder, mental retardation, secondary hyperparathyroidism, essential hypertension who presents to the hospital due to acute hyperkalemia.  1. Acute hyperkalemia-secondary to patient missing hemodialysis for the past week. K down to 5.9. -Patient got urgent hemodialysis last night and today. F/u K in am.  2. End-stage renal disease on hemodialysis-patient normally gets dialysis on Monday Wednesday and Friday. He has missed his dialysis for over a week. On HD now.  3. History of bipolar disorder-continue Trileptal, Seroquel, Haldol  4. History of seizures-no acute seizures presently. Continue Dilantin.  5. Essential hypertension-patient's blood pressure is somewhat uncontrolled due to patient missing hemodialysis for over a week. on oral antihypertensives.  6. Secondary hyperparathyroidism-continue Sensipar, PhosLo.  7. Anxiety-continue Klonopin.  8. GERD-continue Protonix.  Hypoglycemia.  D50 as needed. Mental retardation.  All the records are reviewed and case discussed with Care Management/Social Worker. Management plans discussed with the patient, his brother and they are in agreement.  CODE  STATUS: Full Code  TOTAL TIME TAKING CARE OF THIS PATIENT: 36 minutes.   More than 50% of the time was spent in counseling/coordination of care:  YES  POSSIBLE D/C IN 2 DAYS, DEPENDING ON CLINICAL CONDITION.   Shaune PollackQing Ayush Boulet M.D on 09/24/2017 at 2:59 PM  Between 7am to 6pm - Pager - [phone]  After 6pm go to [email or web address] - Therapist, nutritionalpassword [email or web address]  [facility] [place] [facility]

## 2017-09-25 LAB — BASIC METABOLIC PANEL
ANION GAP: 14 (ref 5–15)
BUN: 51 mg/dL — ABNORMAL HIGH (ref 6–20)
CHLORIDE: 96 mmol/L — AB (ref 101–111)
CO2: 28 mmol/L (ref 22–32)
Calcium: 8.5 mg/dL — ABNORMAL LOW (ref 8.9–10.3)
Creatinine, Ser: 6.97 mg/dL — ABNORMAL HIGH (ref 0.61–1.24)
GFR calc non Af Amer: 7 mL/min — ABNORMAL LOW (ref >60)
GFR, EST AFRICAN AMERICAN: 8 mL/min — AB (ref >60)
GLUCOSE: 75 mg/dL (ref 65–99)
POTASSIUM: 4.8 mmol/L (ref 3.5–5.1)
Sodium: 138 mmol/L (ref 135–145)

## 2017-09-25 LAB — HEPATITIS B SURFACE ANTIGEN: HEP B S AG: NEGATIVE

## 2017-09-25 LAB — HEPATITIS B SURFACE ANTIBODY, QUANTITATIVE: HEPATITIS B-POST: 21 m[IU]/mL

## 2017-09-25 NOTE — Care Management (Signed)
[name] Wiley HD liaison notified of discharge.  

## 2017-09-25 NOTE — Care Management Important Message (Signed)
Important Message  Patient Details  Name: Aldean JewettJames Kienitz MRN: [id number] Date of Birth: 05/18/1951   Medicare Important Message Given:  N/A - LOS <3 / Initial given by admissions    Chapman FitchBOWEN, Douglas Smolinsky T, RN 09/25/2017, 9:45 AM

## 2017-09-25 NOTE — Progress Notes (Signed)
[facility]  ROUNDING NOTE   Subjective:   Hemodialysis treatment yesterday. UF of 2.5 liters.   Objective:  Vital signs in last 24 hours:  Temp:  [97.3 F (36.3 C)-98.4 F (36.9 C)] 97.5 F (36.4 C) (11/30 0406) Pulse Rate:  [70-83] 73 (11/30 0406) Resp:  [15-33] 18 (11/30 0406) BP: (93-161)/(35-100) 128/52 (11/30 0406) SpO2:  [93 %-98 %] 98 % (11/30 0406) Weight:  [98 kg (216 lb 0.8 oz)] 98 kg (216 lb 0.8 oz) (11/29 1030)  Weight change: -3.152 kg (-15.2 oz) Filed Weights   09/24/17 0200 09/24/17 0500 09/24/17 1030  Weight: 97.6 kg (215 lb 3.2 oz) 95.6 kg (210 lb 11.2 oz) 98 kg (216 lb 0.8 oz)    Intake/Output: I/O last 3 completed shifts: In: 240 [P.O.:240] Out: 5036 [Other:5036]   Intake/Output this shift:  No intake/output data recorded.  Physical Exam: General: NAD,   Head: Normocephalic, atraumatic. Moist oral mucosal membranes  Eyes: Anicteric, PERRL  Neck: Supple, trachea midline  Lungs:  Clear to auscultation  Heart: Regular rate and rhythm  Abdomen:  Soft, nontender, obese  Extremities: No peripheral edema.  Neurologic: Follows commands, unable to answer questions  Skin: No lesions  Access: Right AVG    Basic Metabolic Panel: Recent Labs  Lab 09/23/17 1625 09/24/17 0458 09/25/17 0524  NA 135 139 138  K 6.7* 5.9* 4.8  CL 93* 97* 96*  CO2 20* 21* 28  GLUCOSE 100* 56* 75  BUN 161* 107* 51*  CREATININE 15.86* 11.14* 6.97*  CALCIUM 8.7* 8.5* 8.5*    Liver Function Tests: Recent Labs  Lab 09/23/17 1625  AST 39  ALT 29  ALKPHOS 181*  BILITOT 1.0  PROT 7.1  ALBUMIN 3.4*   No results for input(s): LIPASE, AMYLASE in the last 168 hours. No results for input(s): AMMONIA in the last 168 hours.  CBC: Recent Labs  Lab 09/23/17 1625 09/24/17 0458  WBC 4.1 2.9*  NEUTROABS 3.0  --   HGB 11.2* 10.5*  HCT 34.2* 31.6*  MCV 101.8* 102.2*  PLT 137* 100*    Cardiac Enzymes: Recent Labs  Lab 09/23/17 1625  TROPONINI 0.15*     BNP: Invalid input(s): POCBNP  CBG: Recent Labs  Lab 09/24/17 0754 09/24/17 0850 09/24/17 2110  GLUCAP 52* 80 124*    Microbiology: Results for orders placed or performed during the hospital encounter of 09/23/17  MRSA PCR Screening     Status: None   Collection Time: 09/24/17  2:02 AM  Result Value Ref Range Status   MRSA by PCR NEGATIVE NEGATIVE Final    Comment:        The GeneXpert MRSA Assay (FDA approved for NASAL specimens only), is one component of a comprehensive MRSA colonization surveillance program. It is not intended to diagnose MRSA infection nor to guide or monitor treatment for MRSA infections.     Coagulation Studies: No results for input(s): LABPROT, INR in the last 72 hours.  Urinalysis: No results for input(s): COLORURINE, LABSPEC, PHURINE, GLUCOSEU, HGBUR, BILIRUBINUR, KETONESUR, PROTEINUR, UROBILINOGEN, NITRITE, LEUKOCYTESUR in the last 72 hours.  Invalid input(s): APPERANCEUR    Imaging: Dg Chest Portable 1 View  Result Date: 09/23/2017 CLINICAL DATA:  Missed dialysis EXAM: PORTABLE CHEST 1 VIEW COMPARISON:  05/17/2016 FINDINGS: Cardiomegaly is stable. Low lung volumes. Vascular congestion. No Kerley B lines to suggest interstitial edema. No pneumothorax or pleural effusion. Stable left subclavian pacemaker device. IMPRESSION: Stable cardiomegaly and vascular congestion. Electronically Signed   By: Merton BorderArthur  Hoss M.D.   On: 09/23/2017 17:30     Medications:       Assessment/ Plan:  Mr. Aldean JewettJames Kozicki is a 66 y.o. black male with end stage renal disease on hemodialysis, hypertension, mental retardation, seizure disorder, hypothyroidism.   [facility] Right AVG EDW 93.5kg  1. End Stage Renal Disease: with hyperkalemia and uremia on admission. Urgent dialysis on admission.  Two consecutive days of hemodialysis. Volume status and electolytes now at goal.  - Resume TTS schedule.   2. Hypertension: blood pressure volume  driven.  Currently does not take any bp medications at home.   3. Anemia of chronic kidney disease: hemoglobin 10.5 - EPO with HD treatment - Iron supplements.   4. Secondary Hyperparathyroidism: outpatient labs on 11/13: PTH 760 - elevated, phosphorus 4.9.  Calcium at goal during this admission - Continue calcium acetate 2 tabs with each meal.    LOS: 2 Palestine Mosco 11/30/201810:01 AM

## 2017-09-25 NOTE — Progress Notes (Signed)
09/25/2017 9:41 AM  [name] JewettJames Wiley to be D/C'd back to [facility] per MD order.  Discussed prescriptions and follow up appointments with the patient. Prescriptions given to patient, medication list explained in detail. Pt verbalized understanding.  Allergies as of 09/25/2017   No Known Allergies     Medication List    TAKE these medications   calcium acetate 667 MG capsule Commonly known as:  PHOSLO Take 1,334 mg by mouth 3 (three) times daily with meals.   cholecalciferol 1000 units tablet Commonly known as:  VITAMIN D Take 1,000 Units by mouth daily.   cinacalcet 60 MG tablet Commonly known as:  SENSIPAR Take 120 mg by mouth daily. Take with lunch.   clonazePAM 0.5 MG tablet Commonly known as:  KLONOPIN Take 1 tablet (0.5 mg total) by mouth 2 (two) times daily. *hold for sedation*   docusate sodium 100 MG capsule Commonly known as:  COLACE Take 1 capsule (100 mg total) by mouth 2 (two) times daily.   Ferrous Fumarate 324 (106 Fe) MG Tabs tablet Commonly known as:  HEMOCYTE - 106 mg FE Take 1 tablet by mouth daily.   folic acid 400 MCG tablet Commonly known as:  FOLVITE Take 400 mcg by mouth daily.   haloperidol 5 MG tablet Commonly known as:  HALDOL Take 1 tablet (5 mg total) by mouth 3 (three) times a week. Pt takes on Tuesday, Thursday, and Saturday.   levothyroxine 300 MCG tablet Commonly known as:  SYNTHROID, LEVOTHROID Take 600 mcg by mouth daily before breakfast.   metolazone 2.5 MG tablet Commonly known as:  ZAROXOLYN Take 2.5 mg by mouth daily.   multivitamin Tabs tablet Take 1 tablet by mouth daily.   NEPRO Liqd Take 240 mLs by mouth daily.   feeding supplement (NEPRO CARB STEADY) Liqd Take 237 mLs by mouth daily.   omega-3 acid ethyl esters 1 g capsule Commonly known as:  LOVAZA Take 1 g by mouth 2 (two) times daily.   omeprazole 20 MG tablet Commonly known as:  PRILOSEC OTC Take 20 mg by mouth daily.   oxcarbazepine 600 MG  tablet Commonly known as:  TRILEPTAL Take 600 mg by mouth 2 (two) times daily.   oxyCODONE-acetaminophen 5-325 MG tablet Commonly known as:  PERCOCET/ROXICET Take 1 tablet by mouth every 6 (six) hours as needed for moderate pain or severe pain.   phenytoin 100 MG ER capsule Commonly known as:  DILANTIN Take 100-300 mg by mouth See admin instructions. Take 1 capsule by mouth at 6 am, and take 3 capsules (300 mg) by mouth every night at bedtime at 8 pm.   QUEtiapine 300 MG 24 hr tablet Commonly known as:  SEROQUEL XR Take 300 mg by mouth 2 (two) times daily. At 0600 and 1700.   senna-docusate 8.6-50 MG tablet Commonly known as:  Senokot-S Take 1 tablet by mouth daily.   urea 40 % Crea Commonly known as:  CARMOL Apply 1 application topically daily.       Vitals:   09/24/17 1956 09/25/17 0406  BP: 109/60 (!) 128/52  Pulse: 74 73  Resp: 18 18  Temp: 98.4 F (36.9 C) (!) 97.5 F (36.4 C)  SpO2: 97% 98%    Skin clean, dry and intact without evidence of skin break down, no evidence of skin tears noted. IV catheter discontinued intact. Site without signs and symptoms of complications. Dressing and pressure applied. Pt denies pain at this time. No complaints noted.  An After Visit Summary  was printed and given to the patient. Patient escorted and D/C via EMS.  [name] Wiley, [name] Wiley

## 2017-09-25 NOTE — Discharge Instructions (Signed)
Renal diet

## 2017-09-25 NOTE — Discharge Summary (Signed)
[facility] [place] at [facility]   PATIENT NAME: [name] Wiley    MR#:  [id number]  DATE OF BIRTH:  14-Aug-1951  DATE OF ADMISSION:  09/23/2017   ADMITTING PHYSICIAN: Houston Siren, MD  DATE OF DISCHARGE: 09/25/2017  PRIMARY CARE PHYSICIAN: Clinic, General Medical   ADMISSION DIAGNOSIS:  Hyperkalemia [E87.5] Uremia [N19] Encephalopathy [G93.40] End-stage renal disease on hemodialysis (HCC) [N18.6, Z99.2] DISCHARGE DIAGNOSIS:  Active Problems:   Hyperkalemia  SECONDARY DIAGNOSIS:   Past Medical History:  Diagnosis Date  . Anxiety   . CAD (coronary artery disease)   . Dialysis patient (HCC)   . GERD (gastroesophageal reflux disease)   . Hypertension   . Hypothyroid   . Intellectual disability   . Renal disorder   . Seizures [facility])    HOSPITAL COURSE:   66 year old male with past medical history of end-stage renal disease on hemodialysis, seizures, bipolar disorder, mental retardation, secondary hyperparathyroidism, essential hypertension who presents to the hospital due to acute hyperkalemia.  1. Acute hyperkalemia-secondary to patient missing hemodialysis for the past week. K down to 5.9->4.8.  Improved today. -Patient got urgent hemodialysis for 2 days.  2. End-stage renal disease on hemodialysis-patient normally gets dialysis on Monday Wednesday and Friday. He missed his dialysis for over a week. On HD.  3. History of bipolar disorder-continue Trileptal, Seroquel,Haldol  4. History of seizures-no acute seizures presently. Continue Dilantin.  5. Essential hypertension-patient's blood pressure is somewhat uncontrolled due to patient missing hemodialysis for over a week. on oral antihypertensives.  6. Secondary hyperparathyroidism-continue Sensipar, PhosLo.  7. Anxiety-continue Klonopin.  8. GERD-continue Protonix.  Hypoglycemia.  D50 as needed.  Improved. Mental retardation. DISCHARGE CONDITIONS:  Stable, discharge back to  skilled nursing facility today. CONSULTS OBTAINED:  Treatment Team:  Lamont Dowdy, MD DRUG ALLERGIES:  No Known Allergies DISCHARGE MEDICATIONS:   Allergies as of 09/25/2017   No Known Allergies     Medication List    TAKE these medications   calcium acetate 667 MG capsule Commonly known as:  PHOSLO Take 1,334 mg by mouth 3 (three) times daily with meals.   cholecalciferol 1000 units tablet Commonly known as:  VITAMIN D Take 1,000 Units by mouth daily.   cinacalcet 60 MG tablet Commonly known as:  SENSIPAR Take 120 mg by mouth daily. Take with lunch.   clonazePAM 0.5 MG tablet Commonly known as:  KLONOPIN Take 1 tablet (0.5 mg total) by mouth 2 (two) times daily. *hold for sedation*   docusate sodium 100 MG capsule Commonly known as:  COLACE Take 1 capsule (100 mg total) by mouth 2 (two) times daily.   Ferrous Fumarate 324 (106 Fe) MG Tabs tablet Commonly known as:  HEMOCYTE - 106 mg FE Take 1 tablet by mouth daily.   folic acid 400 MCG tablet Commonly known as:  FOLVITE Take 400 mcg by mouth daily.   haloperidol 5 MG tablet Commonly known as:  HALDOL Take 1 tablet (5 mg total) by mouth 3 (three) times a week. Pt takes on Tuesday, Thursday, and Saturday.   levothyroxine 300 MCG tablet Commonly known as:  SYNTHROID, LEVOTHROID Take 600 mcg by mouth daily before breakfast.   metolazone 2.5 MG tablet Commonly known as:  ZAROXOLYN Take 2.5 mg by mouth daily.   multivitamin Tabs tablet Take 1 tablet by mouth daily.   NEPRO Liqd Take 240 mLs by mouth daily.   feeding supplement (NEPRO CARB STEADY) Liqd Take 237 mLs by mouth daily.   omega-3  acid ethyl esters 1 g capsule Commonly known as:  LOVAZA Take 1 g by mouth 2 (two) times daily.   omeprazole 20 MG tablet Commonly known as:  PRILOSEC OTC Take 20 mg by mouth daily.   oxcarbazepine 600 MG tablet Commonly known as:  TRILEPTAL Take 600 mg by mouth 2 (two) times daily.   oxyCODONE-acetaminophen  5-325 MG tablet Commonly known as:  PERCOCET/ROXICET Take 1 tablet by mouth every 6 (six) hours as needed for moderate pain or severe pain.   phenytoin 100 MG ER capsule Commonly known as:  DILANTIN Take 100-300 mg by mouth See admin instructions. Take 1 capsule by mouth at 6 am, and take 3 capsules (300 mg) by mouth every night at bedtime at 8 pm.   QUEtiapine 300 MG 24 hr tablet Commonly known as:  SEROQUEL XR Take 300 mg by mouth 2 (two) times daily. At 0600 and 1700.   senna-docusate 8.6-50 MG tablet Commonly known as:  Senokot-S Take 1 tablet by mouth daily.   urea 40 % Crea Commonly known as:  CARMOL Apply 1 application topically daily.        DISCHARGE INSTRUCTIONS:  See AVS. If you experience worsening of your admission symptoms, develop shortness of breath, life threatening emergency, suicidal or homicidal thoughts you must seek medical attention immediately by calling 911 or calling your MD immediately  if symptoms less severe.  You Must read complete instructions/literature along with all the possible adverse reactions/side effects for all the Medicines you take and that have been prescribed to you. Take any new Medicines after you have completely understood and accpet all the possible adverse reactions/side effects.   Please note  You were cared for by a hospitalist during your hospital stay. If you have any questions about your discharge medications or the care you received while you were in the hospital after you are discharged, you can call the unit and asked to speak with the hospitalist on call if the hospitalist that took care of you is not available. Once you are discharged, your primary care physician will handle any further medical issues. Please note that NO REFILLS for any discharge medications will be authorized once you are discharged, as it is imperative that you return to your primary care physician (or establish a relationship with a primary care physician  if you do not have one) for your aftercare needs so that they can reassess your need for medications and monitor your lab values.    On the day of Discharge:  VITAL SIGNS:  Blood pressure (!) 128/52, pulse 73, temperature (!) 97.5 F (36.4 C), temperature source Oral, resp. rate 18, height 5\' 7"  (1.702 m), weight 216 lb 0.8 oz (98 kg), SpO2 98 %. PHYSICAL EXAMINATION:  GENERAL:  66 y.o.-year-old patient lying in the bed with no acute distress.  EYES: Pupils equal, round, reactive to light and accommodation. No scleral icterus. Extraocular muscles intact.  HEENT: Head atraumatic, normocephalic. Oropharynx and nasopharynx clear.  NECK:  Supple, no jugular venous distention. No thyroid enlargement, no tenderness.  LUNGS: Normal breath sounds bilaterally, no wheezing, rales,rhonchi or crepitation. No use of accessory muscles of respiration.  CARDIOVASCULAR: S1, S2 normal. No murmurs, rubs, or gallops.  ABDOMEN: Soft, non-tender, non-distended. Bowel sounds present. No organomegaly or mass.  EXTREMITIES: No pedal edema, cyanosis, or clubbing.  NEUROLOGIC: Cranial nerves II through XII are intact. Muscle strength 5/5 in all extremities. Sensation intact. Gait not checked.  PSYCHIATRIC: The patient is awake and demented.  SKIN: No obvious rash, lesion, or ulcer.  DATA REVIEW:   CBC Recent Labs  Lab 09/24/17 0458  WBC 2.9*  HGB 10.5*  HCT 31.6*  PLT 100*    Chemistries  Recent Labs  Lab 09/23/17 1625  09/25/17 0524  NA 135   < > 138  K 6.7*   < > 4.8  CL 93*   < > 96*  CO2 20*   < > 28  GLUCOSE 100*   < > 75  BUN 161*   < > 51*  CREATININE 15.86*   < > 6.97*  CALCIUM 8.7*   < > 8.5*  AST 39  --   --   ALT 29  --   --   ALKPHOS 181*  --   --   BILITOT 1.0  --   --    < > = values in this interval not displayed.     Microbiology Results  Results for orders placed or performed during the hospital encounter of 09/23/17  MRSA PCR Screening     Status: None   Collection Time:  09/24/17  2:02 AM  Result Value Ref Range Status   MRSA by PCR NEGATIVE NEGATIVE Final    Comment:        The GeneXpert MRSA Assay (FDA approved for NASAL specimens only), is one component of a comprehensive MRSA colonization surveillance program. It is not intended to diagnose MRSA infection nor to guide or monitor treatment for MRSA infections.     RADIOLOGY:  No results found.   Management plans discussed with the patient, family and they are in agreement.  CODE STATUS: Full Code   TOTAL TIME TAKING CARE OF THIS PATIENT: 32 minutes.    Shaune PollackQing Kathrene Sinopoli M.D on 09/25/2017 at 7:56 AM  Between 7am to 6pm - Pager - [phone]  After 6pm go to [email or web address] - Social research officer, governmentpassword [email or web address]  [facility] [place] [facility]  Office  [phone]  CC: Primary care physician; Clinic, General Medical   Note: This dictation was prepared with Dragon dictation along with smaller phrase technology. Any transcriptional errors that result from this process are unintentional.

## 2017-09-25 NOTE — Progress Notes (Addendum)
Patient is medically stable for D/C back to [facility] today. Per Broadus John patient can return today to room 304. RN will call report and arrange EMS for transport. Clinical Education officer, museum (CSW) sent D/C orders to [facility] via [facility]. CSW contacted [facility] dialysis coordinator and made her aware of above. CSW met with patient and made him aware of above. CSW left patient's brother Winferd Humphrey and sister Velma voicemails. CSW attempted to contact patient's brother Hendricks Milo however his voicemail was full. Please reconsult if future social work needs arise. CSW signing off.   Patient's brother Winferd Humphrey called CSW back and was made aware of above.   [employer], LCSW [phone]

## 2017-11-26 ENCOUNTER — Encounter (INDEPENDENT_AMBULATORY_CARE_PROVIDER_SITE_OTHER): Payer: Self-pay

## 2017-11-27 ENCOUNTER — Inpatient Hospital Stay
Admission: RE | Disposition: A | Discharge status: Skilled Nursing Facility | Payer: Self-pay | Source: Ambulatory Visit | Attending: Internal Medicine

## 2017-11-27 ENCOUNTER — Other Ambulatory Visit: Payer: Self-pay

## 2017-11-27 ENCOUNTER — Other Ambulatory Visit (INDEPENDENT_AMBULATORY_CARE_PROVIDER_SITE_OTHER): Payer: Self-pay | Admitting: Vascular Surgery

## 2017-11-27 ENCOUNTER — Inpatient Hospital Stay
Admission: RE | Admit: 2017-11-27 | Discharge: 2017-12-02 | DRG: 252 | Disposition: A | Discharge status: Skilled Nursing Facility | Payer: Medicare Other | Source: Ambulatory Visit | Attending: Internal Medicine | Admitting: Internal Medicine

## 2017-11-27 DIAGNOSIS — T82868A Thrombosis of vascular prosthetic devices, implants and grafts, initial encounter: Secondary | ICD-10-CM | POA: Diagnosis present

## 2017-11-27 DIAGNOSIS — N186 End stage renal disease: Secondary | ICD-10-CM | POA: Diagnosis present

## 2017-11-27 DIAGNOSIS — E1122 Type 2 diabetes mellitus with diabetic chronic kidney disease: Secondary | ICD-10-CM | POA: Diagnosis present

## 2017-11-27 DIAGNOSIS — Z8249 Family history of ischemic heart disease and other diseases of the circulatory system: Secondary | ICD-10-CM | POA: Diagnosis not present

## 2017-11-27 DIAGNOSIS — Z79899 Other long term (current) drug therapy: Secondary | ICD-10-CM | POA: Diagnosis not present

## 2017-11-27 DIAGNOSIS — N2581 Secondary hyperparathyroidism of renal origin: Secondary | ICD-10-CM | POA: Diagnosis present

## 2017-11-27 DIAGNOSIS — E875 Hyperkalemia: Secondary | ICD-10-CM | POA: Diagnosis present

## 2017-11-27 DIAGNOSIS — F79 Unspecified intellectual disabilities: Secondary | ICD-10-CM | POA: Diagnosis present

## 2017-11-27 DIAGNOSIS — I2510 Atherosclerotic heart disease of native coronary artery without angina pectoris: Secondary | ICD-10-CM | POA: Diagnosis present

## 2017-11-27 DIAGNOSIS — Z992 Dependence on renal dialysis: Secondary | ICD-10-CM

## 2017-11-27 DIAGNOSIS — F419 Anxiety disorder, unspecified: Secondary | ICD-10-CM | POA: Diagnosis present

## 2017-11-27 DIAGNOSIS — Z539 Procedure and treatment not carried out, unspecified reason: Secondary | ICD-10-CM | POA: Diagnosis present

## 2017-11-27 DIAGNOSIS — D631 Anemia in chronic kidney disease: Secondary | ICD-10-CM | POA: Diagnosis present

## 2017-11-27 DIAGNOSIS — I10 Essential (primary) hypertension: Secondary | ICD-10-CM | POA: Diagnosis not present

## 2017-11-27 DIAGNOSIS — Y832 Surgical operation with anastomosis, bypass or graft as the cause of abnormal reaction of the patient, or of later complication, without mention of misadventure at the time of the procedure: Secondary | ICD-10-CM | POA: Diagnosis present

## 2017-11-27 DIAGNOSIS — Z79890 Hormone replacement therapy (postmenopausal): Secondary | ICD-10-CM | POA: Diagnosis not present

## 2017-11-27 DIAGNOSIS — E039 Hypothyroidism, unspecified: Secondary | ICD-10-CM | POA: Diagnosis present

## 2017-11-27 DIAGNOSIS — G40909 Epilepsy, unspecified, not intractable, without status epilepticus: Secondary | ICD-10-CM | POA: Diagnosis present

## 2017-11-27 DIAGNOSIS — K219 Gastro-esophageal reflux disease without esophagitis: Secondary | ICD-10-CM | POA: Diagnosis present

## 2017-11-27 DIAGNOSIS — Z79891 Long term (current) use of opiate analgesic: Secondary | ICD-10-CM | POA: Diagnosis not present

## 2017-11-27 DIAGNOSIS — N179 Acute kidney failure, unspecified: Secondary | ICD-10-CM | POA: Diagnosis present

## 2017-11-27 DIAGNOSIS — E119 Type 2 diabetes mellitus without complications: Secondary | ICD-10-CM

## 2017-11-27 DIAGNOSIS — I120 Hypertensive chronic kidney disease with stage 5 chronic kidney disease or end stage renal disease: Secondary | ICD-10-CM | POA: Diagnosis present

## 2017-11-27 HISTORY — PX: PERIPHERAL VASCULAR THROMBECTOMY: CATH118306

## 2017-11-27 LAB — BASIC METABOLIC PANEL
ANION GAP: 16 — AB (ref 5–15)
BUN: 59 mg/dL — AB (ref 6–20)
CO2: 26 mmol/L (ref 22–32)
Calcium: 8.9 mg/dL (ref 8.9–10.3)
Chloride: 93 mmol/L — ABNORMAL LOW (ref 101–111)
Creatinine, Ser: 6.78 mg/dL — ABNORMAL HIGH (ref 0.61–1.24)
GFR calc Af Amer: 9 mL/min — ABNORMAL LOW (ref >60)
GFR, EST NON AFRICAN AMERICAN: 7 mL/min — AB (ref >60)
GLUCOSE: 70 mg/dL (ref 65–99)
Potassium: 4.2 mmol/L (ref 3.5–5.1)
Sodium: 135 mmol/L (ref 135–145)

## 2017-11-27 LAB — MRSA PCR SCREENING: MRSA by PCR: NEGATIVE

## 2017-11-27 LAB — POTASSIUM (ARMC VASCULAR LAB ONLY): POTASSIUM (ARMC VASCULAR LAB): 5.6 — AB (ref 3.5–5.1)

## 2017-11-27 SURGERY — PERIPHERAL VASCULAR THROMBECTOMY
Anesthesia: Moderate Sedation | Laterality: Right

## 2017-11-27 MED ORDER — PHENYTOIN SODIUM EXTENDED 100 MG PO CAPS
100.0000 mg | ORAL_CAPSULE | Freq: Every day | ORAL | Status: DC
  Administered 2017-11-28 – 2017-12-02 (×5): 100 mg via ORAL
  Filled 2017-11-27 (×5): qty 1

## 2017-11-27 MED ORDER — CLONAZEPAM 0.5 MG PO TABS
0.5000 mg | ORAL_TABLET | Freq: Two times a day (BID) | ORAL | Status: DC
  Administered 2017-11-27 – 2017-12-02 (×10): 0.5 mg via ORAL
  Filled 2017-11-27 (×10): qty 1

## 2017-11-27 MED ORDER — CEFAZOLIN SODIUM-DEXTROSE 1-4 GM/50ML-% IV SOLN: 1.0000 g | Freq: Once | INTRAVENOUS | Status: DC

## 2017-11-27 MED ORDER — METHYLPREDNISOLONE SODIUM SUCC 125 MG IJ SOLR: 125.0000 mg | INTRAMUSCULAR | Status: DC | PRN

## 2017-11-27 MED ORDER — FERROUS FUMARATE 324 (106 FE) MG PO TABS
1.0000 | ORAL_TABLET | Freq: Every day | ORAL | Status: DC
  Administered 2017-11-28 – 2017-12-01 (×4): 106 mg via ORAL
  Filled 2017-11-27 (×6): qty 1

## 2017-11-27 MED ORDER — LEVOTHYROXINE SODIUM 100 MCG PO TABS
600.0000 ug | ORAL_TABLET | Freq: Every day | ORAL | Status: DC
  Administered 2017-11-28 – 2017-12-02 (×5): 600 ug via ORAL
  Filled 2017-11-27: qty 6
  Filled 2017-11-27: qty 12
  Filled 2017-11-27 (×2): qty 6
  Filled 2017-11-27: qty 12
  Filled 2017-11-27: qty 6

## 2017-11-27 MED ORDER — OXYCODONE-ACETAMINOPHEN 5-325 MG PO TABS: 1.0000 | ORAL_TABLET | Freq: Four times a day (QID) | ORAL | Status: DC | PRN

## 2017-11-27 MED ORDER — OMEPRAZOLE MAGNESIUM 20 MG PO TBEC: 20.0000 mg | DELAYED_RELEASE_TABLET | Freq: Every day | ORAL | Status: DC

## 2017-11-27 MED ORDER — SENNOSIDES-DOCUSATE SODIUM 8.6-50 MG PO TABS
1.0000 | ORAL_TABLET | Freq: Every day | ORAL | Status: DC
  Administered 2017-11-28 – 2017-12-01 (×4): 1 via ORAL
  Filled 2017-11-27 (×4): qty 1

## 2017-11-27 MED ORDER — QUETIAPINE FUMARATE ER 300 MG PO TB24
300.0000 mg | ORAL_TABLET | Freq: Two times a day (BID) | ORAL | Status: DC
  Administered 2017-11-28 – 2017-12-02 (×11): 300 mg via ORAL
  Filled 2017-11-27 (×11): qty 1

## 2017-11-27 MED ORDER — PANTOPRAZOLE SODIUM 40 MG PO TBEC
40.0000 mg | DELAYED_RELEASE_TABLET | Freq: Every day | ORAL | Status: DC
  Administered 2017-11-28 – 2017-12-02 (×5): 40 mg via ORAL
  Filled 2017-11-27 (×5): qty 1

## 2017-11-27 MED ORDER — FAMOTIDINE 20 MG PO TABS: 40.0000 mg | ORAL_TABLET | ORAL | Status: DC | PRN

## 2017-11-27 MED ORDER — OXCARBAZEPINE 300 MG PO TABS
600.0000 mg | ORAL_TABLET | Freq: Two times a day (BID) | ORAL | Status: DC
  Administered 2017-11-27 – 2017-12-02 (×10): 600 mg via ORAL
  Filled 2017-11-27: qty 2
  Filled 2017-11-27: qty 4
  Filled 2017-11-27 (×9): qty 2

## 2017-11-27 MED ORDER — INFLUENZA VAC SPLIT HIGH-DOSE 0.5 ML IM SUSY
0.5000 mL | PREFILLED_SYRINGE | INTRAMUSCULAR | Status: DC
  Filled 2017-11-27: qty 0.5

## 2017-11-27 MED ORDER — CINACALCET HCL 30 MG PO TABS
120.0000 mg | ORAL_TABLET | Freq: Every day | ORAL | Status: DC
  Administered 2017-11-28 – 2017-12-02 (×5): 120 mg via ORAL
  Filled 2017-11-27 (×6): qty 4

## 2017-11-27 MED ORDER — DOCUSATE SODIUM 100 MG PO CAPS
100.0000 mg | ORAL_CAPSULE | Freq: Two times a day (BID) | ORAL | Status: DC
  Administered 2017-11-27 – 2017-12-01 (×8): 100 mg via ORAL
  Filled 2017-11-27 (×8): qty 1

## 2017-11-27 MED ORDER — HALOPERIDOL 5 MG PO TABS: 5.0000 mg | ORAL_TABLET | ORAL | Status: DC

## 2017-11-27 MED ORDER — DOCUSATE SODIUM 100 MG PO CAPS: 100.0000 mg | ORAL_CAPSULE | Freq: Two times a day (BID) | ORAL | Status: DC | PRN

## 2017-11-27 MED ORDER — PHENYTOIN SODIUM EXTENDED 100 MG PO CAPS
300.0000 mg | ORAL_CAPSULE | Freq: Every day | ORAL | Status: DC
  Administered 2017-11-27 – 2017-12-01 (×5): 300 mg via ORAL
  Filled 2017-11-27 (×6): qty 3

## 2017-11-27 MED ORDER — CALCIUM ACETATE 667 MG PO CAPS
1334.0000 mg | ORAL_CAPSULE | Freq: Three times a day (TID) | ORAL | Status: DC
  Administered 2017-11-28 – 2017-12-02 (×13): 1334 mg via ORAL
  Filled 2017-11-27 (×27): qty 2

## 2017-11-27 MED ORDER — OMEGA-3-ACID ETHYL ESTERS 1 G PO CAPS
1.0000 g | ORAL_CAPSULE | Freq: Two times a day (BID) | ORAL | Status: DC
  Administered 2017-11-27 – 2017-12-01 (×7): 1 g via ORAL
  Filled 2017-11-27 (×7): qty 1

## 2017-11-27 MED ORDER — RENA-VITE PO TABS
1.0000 | ORAL_TABLET | Freq: Every day | ORAL | Status: DC
  Administered 2017-11-28 – 2017-12-01 (×4): 1 via ORAL
  Filled 2017-11-27 (×4): qty 1

## 2017-11-27 MED ORDER — NEPRO PO LIQD
240.0000 mL | Freq: Every day | ORAL | Status: DC
  Filled 2017-11-27 (×2): qty 250

## 2017-11-27 MED ORDER — HYDROMORPHONE HCL 1 MG/ML IJ SOLN: 1.0000 mg | Freq: Once | INTRAMUSCULAR | Status: DC | PRN

## 2017-11-27 MED ORDER — NEPRO/CARBSTEADY PO LIQD
237.0000 mL | Freq: Every day | ORAL | Status: DC
  Administered 2017-11-28 – 2017-12-01 (×3): 237 mL via ORAL

## 2017-11-27 MED ORDER — FOLIC ACID 1 MG PO TABS
500.0000 ug | ORAL_TABLET | Freq: Every day | ORAL | Status: DC
  Administered 2017-11-28 – 2017-12-01 (×4): 0.5 mg via ORAL
  Filled 2017-11-27 (×6): qty 1

## 2017-11-27 MED ORDER — HEPARIN SODIUM (PORCINE) 5000 UNIT/ML IJ SOLN
5000.0000 [IU] | Freq: Three times a day (TID) | INTRAMUSCULAR | Status: DC
  Administered 2017-11-27 – 2017-12-01 (×12): 5000 [IU] via SUBCUTANEOUS
  Filled 2017-11-27 (×13): qty 1

## 2017-11-27 MED ORDER — ONDANSETRON HCL 4 MG/2ML IJ SOLN: 4.0000 mg | Freq: Four times a day (QID) | INTRAMUSCULAR | Status: DC | PRN

## 2017-11-27 MED ORDER — VITAMIN D 1000 UNITS PO TABS
1000.0000 [IU] | ORAL_TABLET | Freq: Every day | ORAL | Status: DC
  Administered 2017-11-28 – 2017-12-01 (×4): 1000 [IU] via ORAL
  Filled 2017-11-27 (×4): qty 1

## 2017-11-27 MED ORDER — METOLAZONE 2.5 MG PO TABS
2.5000 mg | ORAL_TABLET | Freq: Every day | ORAL | Status: DC
  Administered 2017-11-28 – 2017-12-02 (×4): 2.5 mg via ORAL
  Filled 2017-11-27 (×6): qty 1

## 2017-11-27 MED ORDER — SODIUM CHLORIDE 0.9 % IV SOLN
INTRAVENOUS | Status: DC
  Administered 2017-11-27: 14:00:00 via INTRAVENOUS

## 2017-11-27 SURGICAL SUPPLY — 1 items: KIT DIALYSIS CATH TRI 30X13 (CATHETERS) ×3 IMPLANT

## 2017-11-27 NOTE — Discharge Instructions (Signed)

## 2017-11-27 NOTE — H&P (Signed)
[facility] - [place] at [facility]   PATIENT NAME: [name] Wiley    MR#:  [id number]  DATE OF BIRTH:  December 17, 1950  DATE OF ADMISSION:  11/27/2017  PRIMARY CARE PHYSICIAN: Clinic, General Medical   REQUESTING/REFERRING PHYSICIAN:   CHIEF COMPLAINT:  No chief complaint on file.   HISTORY OF PRESENT ILLNESS: [name] Wiley  is a 67 y.o. male with a known history of Anxiety, CAD, ESRD on HD, Htn, Seizures- Brought to vascular procedures for declotting the fistula. Came for the procedure- noted to have high potassium, so Dr. Edrick Kins suggest to have temp catheter and do HD and admit today. Pt have no complains.  PAST MEDICAL HISTORY:   Past Medical History:  Diagnosis Date  . Anxiety   . CAD (coronary artery disease)   . Dialysis patient (HCC)   . GERD (gastroesophageal reflux disease)   . Hypertension   . Hypothyroid   . Intellectual disability   . Renal disorder   . Seizures (HCC)     PAST SURGICAL HISTORY:  Past Surgical History:  Procedure Laterality Date  . AV FISTULA PLACEMENT Right   . FISTULOGRAM Right 03/06/2015   Procedure: Fistulogram;  Surgeon: Renford Dills, MD;  Location: [facility] INVASIVE CV LAB;  Service: Cardiovascular;  Laterality: Right;  . INSERT / REPLACE / REMOVE PACEMAKER    . PERIPHERAL VASCULAR CATHETERIZATION N/A 05/01/2016   Procedure: A/V Shunt Intervention/declot, possible permcath placement;  Surgeon: Annice Needy, MD;  Location: [facility] INVASIVE CV LAB;  Service: Cardiovascular;  Laterality: N/A;    SOCIAL HISTORY:  Social History   Tobacco Use  . Smoking status: Never Smoker  . Smokeless tobacco: Never Used  Substance Use Topics  . Alcohol use: No    FAMILY HISTORY:  Family History  Problem Relation Age of Onset  . Congestive Heart Failure Father     DRUG ALLERGIES: No Known Allergies  REVIEW OF SYSTEMS:   CONSTITUTIONAL: No fever, fatigue or weakness.  EYES: No blurred or double vision.  EARS, NOSE, AND THROAT: No  tinnitus or ear pain.  RESPIRATORY: No cough, shortness of breath, wheezing or hemoptysis.  CARDIOVASCULAR: No chest pain, orthopnea, edema.  GASTROINTESTINAL: No nausea, vomiting, diarrhea or abdominal pain.  GENITOURINARY: No dysuria, hematuria.  ENDOCRINE: No polyuria, nocturia,  HEMATOLOGY: No anemia, easy bruising or bleeding SKIN: No rash or lesion. MUSCULOSKELETAL: No joint pain or arthritis.   NEUROLOGIC: No tingling, numbness, weakness.  PSYCHIATRY: No anxiety or depression.   MEDICATIONS AT HOME:  Prior to Admission medications   Medication Sig Start Date End Date Taking? Authorizing Provider  calcium acetate (PHOSLO) 667 MG capsule Take 1,334 mg by mouth 3 (three) times daily with meals.   Yes [provider]  cholecalciferol (VITAMIN D) 1000 units tablet Take 1,000 Units by mouth daily.   Yes [provider]  cinacalcet (SENSIPAR) 60 MG tablet Take 120 mg by mouth daily. Take with lunch.    Yes [provider]  clonazePAM (KLONOPIN) 0.5 MG tablet Take 1 tablet (0.5 mg total) by mouth 2 (two) times daily. *hold for sedation* 05/01/16  Yes Enid Baas, MD  docusate sodium (COLACE) 100 MG capsule Take 1 capsule (100 mg total) by mouth 2 (two) times daily. 05/01/16  Yes Enid Baas, MD  Ferrous Fumarate 324 (106 FE) MG TABS Take 1 tablet by mouth daily.   Yes [provider]  folic acid (FOLVITE) 400 MCG tablet Take 400 mcg by mouth daily.    Yes  [provider]  haloperidol (HALDOL) 5 MG tablet Take 1 tablet (5 mg total) by mouth 3 (three) times a week. Pt takes on Tuesday, Thursday, and Saturday. 05/01/16  Yes Enid BaasKalisetti, Radhika, MD  levothyroxine (SYNTHROID, LEVOTHROID) 300 MCG tablet Take 600 mcg by mouth daily before breakfast.   Yes [provider]  metolazone (ZAROXOLYN) 2.5 MG tablet Take 2.5 mg by mouth daily.   Yes [provider]  multivitamin (RENA-VIT) TABS tablet Take 1 tablet by mouth daily.   Yes  [provider]  Nutritional Supplements (FEEDING SUPPLEMENT, NEPRO CARB STEADY,) LIQD Take 237 mLs by mouth daily. 05/01/16  Yes Enid BaasKalisetti, Radhika, MD  Nutritional Supplements (NEPRO) LIQD Take 240 mLs by mouth daily.   Yes [provider]  omega-3 acid ethyl esters (LOVAZA) 1 G capsule Take 1 g by mouth 2 (two) times daily.   Yes [provider]  omeprazole (PRILOSEC OTC) 20 MG tablet Take 20 mg by mouth daily.   Yes [provider]  oxcarbazepine (TRILEPTAL) 600 MG tablet Take 600 mg by mouth 2 (two) times daily.   Yes [provider]  oxyCODONE-acetaminophen (PERCOCET/ROXICET) 5-325 MG tablet Take 1 tablet by mouth every 6 (six) hours as needed for moderate pain or severe pain. 05/01/16  Yes Enid BaasKalisetti, Radhika, MD  phenytoin (DILANTIN) 100 MG ER capsule Take 100-300 mg by mouth See admin instructions. Take 1 capsule by mouth at 6 am, and take 3 capsules (300 mg) by mouth every night at bedtime at 8 pm.   Yes [provider]  QUEtiapine (SEROQUEL XR) 300 MG 24 hr tablet Take 300 mg by mouth 2 (two) times daily. At 0600 and 1700.   Yes [provider]  senna-docusate (SENOKOT-S) 8.6-50 MG tablet Take 1 tablet by mouth daily. 05/01/16  Yes Enid BaasKalisetti, Radhika, MD  urea (CARMOL) 40 % CREA Apply 1 application topically daily.   Yes [provider]      PHYSICAL EXAMINATION:   VITAL SIGNS: Blood pressure (!) 155/74, pulse 70, temperature 98.8 F (37.1 C), temperature source Oral, resp. rate 20, height 5\' 7"  (1.702 m), weight 98 kg (216 lb), SpO2 96 %.  GENERAL:  67 y.o.-year-old patient lying in the bed with no acute distress.  EYES: Pupils equal, round, reactive to light and accommodation. No scleral icterus. Extraocular muscles intact.  HEENT: Head atraumatic, normocephalic. Oropharynx and nasopharynx clear.  NECK:  Supple, no jugular venous distention. No thyroid enlargement, no tenderness.  LUNGS: Normal breath sounds  bilaterally, no wheezing, rales,rhonchi or crepitation. No use of accessory muscles of respiration.  CARDIOVASCULAR: S1, S2 normal. No murmurs, rubs, or gallops.  ABDOMEN: Soft, nontender, nondistended. Bowel sounds present. No organomegaly or mass.  EXTREMITIES: No pedal edema, cyanosis, or clubbing.  NEUROLOGIC: Cranial nerves II through XII are intact. Muscle strength 5/5 in all extremities. Sensation intact. Gait not checked. Pt have incomprehensible speech, but it is baseline for him. PSYCHIATRIC: The patient is alert and oriented x 3.  SKIN: No obvious rash, lesion, or ulcer.   LABORATORY PANEL:   CBC No results for input(s): WBC, HGB, HCT, PLT, MCV, MCH, MCHC, RDW, LYMPHSABS, MONOABS, EOSABS, BASOSABS, BANDABS in the last 168 hours.  Invalid input(s): NEUTRABS, BANDSABD ------------------------------------------------------------------------------------------------------------------  Chemistries  No results for input(s): NA, K, CL, CO2, GLUCOSE, BUN, CREATININE, CALCIUM, MG, AST, ALT, ALKPHOS, BILITOT in the last 168 hours.  Invalid input(s): GFRCGP ------------------------------------------------------------------------------------------------------------------ CrCl cannot be calculated (Patient's most recent lab result is older than the maximum 21 days  allowed.). ------------------------------------------------------------------------------------------------------------------ No results for input(s): TSH, T4TOTAL, T3FREE, THYROIDAB in the last 72 hours.  Invalid input(s): FREET3   Coagulation profile No results for input(s): INR, PROTIME in the last 168 hours. ------------------------------------------------------------------------------------------------------------------- No results for input(s): DDIMER in the last 72 hours. -------------------------------------------------------------------------------------------------------------------  Cardiac Enzymes No results for  input(s): CKMB, TROPONINI, MYOGLOBIN in the last 168 hours.  Invalid input(s): CK ------------------------------------------------------------------------------------------------------------------ Invalid input(s): POCBNP  ---------------------------------------------------------------------------------------------------------------  Urinalysis    Component Value Date/Time   COLORURINE Yellow 02/20/2014 2215   APPEARANCEUR Clear 02/20/2014 2215   LABSPEC 1.010 02/20/2014 2215   PHURINE 8.0 02/20/2014 2215   GLUCOSEU 50 mg/dL 16/07/9603 5409   HGBUR Negative 02/20/2014 2215   BILIRUBINUR Negative 02/20/2014 2215   KETONESUR Negative 02/20/2014 2215   PROTEINUR 100 mg/dL 81/19/1478 2956   NITRITE Negative 02/20/2014 2215   LEUKOCYTESUR 1+ 02/20/2014 2215     RADIOLOGY: No results found.  EKG: Orders placed or performed during the hospital encounter of 09/23/17  . ED EKG  . ED EKG    IMPRESSION AND PLAN:  * Hyperkalemia   Need HD today.   Vascular to place temp cath and Nephro is aware.  * ESRD on HD   Nephro to do HD>  * Hypothyroidism   Cont home meds.  * seizures   Cont phenytoin.  All the records are reviewed and case discussed with ED provider. Management plans discussed with the patient, family and they are in agreement.  CODE STATUS: Full. Code Status History    Date Active Date Inactive Code Status Order ID Comments User Context   09/24/2017 00:34 09/25/2017 12:54 Full Code [id number]  Houston Siren, MD Inpatient   04/26/2016 14:47 05/01/2016 21:33 Full Code [id number]  Katharina Caper, MD Inpatient   03/03/2015 18:43 03/06/2015 19:33 Full Code [id number]  Enid Baas, MD Inpatient       TOTAL TIME TAKING CARE OF THIS PATIENT: 45 minutes.    Altamese Dilling M.D on 11/27/2017   Between 7am to 6pm - Pager - [phone]  After 6pm go to [email or web address] - password [email or web address]  [facility] [facility]  Office  [phone]  CC: Primary  care physician; Clinic, General Medical   Note: This dictation was prepared with Dragon dictation along with smaller phrase technology. Any transcriptional errors that result from this process are unintentional.

## 2017-11-27 NOTE — Progress Notes (Signed)
HD Tx started  

## 2017-11-27 NOTE — Op Note (Signed)
  OPERATIVE NOTE   PROCEDURE: 1. Ultrasound guidance for vascular access right femoral vein 2. Placement of a temporary try Alysis dialysis catheter right femoral vein  PRE-OPERATIVE DIAGNOSIS: 1. Acute renal failure 2.  Hyperkalemia 3.  Thrombosis AV access  POST-OPERATIVE DIAGNOSIS: Same  SURGEON: Levora DredgeGregory Chanley Mcenery, MD  ASSISTANT(S): None  ANESTHESIA: local  ESTIMATED BLOOD LOSS: Minimal   FINDING(S): 1. None  SPECIMEN(S): None  INDICATIONS:  Patient is a 67 y.o.male who presents with thrombosis of his AV access and hyperkalemia.  Risks and benefits were discussed, and informed consent was obtained..  DESCRIPTION: After obtaining full informed written consent, the patient was laid flat in the bed. The right groin was sterilely prepped and draped in a sterile surgical field was created. The right common femoral vein was visualized with ultrasound and found to be widely patent. It was then accessed under direct guidance without difficulty with a Seldinger needle and a permanent image was recorded. A J-wire was then placed. After skin nick and dilatation, a 30 cm try Alysis dialysis catheter was placed over the wire and the wire was removed. The lumens withdrew dark red nonpulsatile blood and flushed easily with sterile saline. The catheter was secured to the skin with 3 nylon sutures. Sterile dressing was placed.  COMPLICATIONS: None  CONDITION: Stable  Levora DredgeGregory Lucion Dilger 11/27/2017 4:17 PM  This note was created with Dragon Medical transcription system. Any errors in dictation are purely unintentional.

## 2017-11-27 NOTE — Progress Notes (Signed)
Pre HD Assessment  

## 2017-11-27 NOTE — Progress Notes (Signed)
[facility]  ROUNDING NOTE   Subjective:   Mr. Aldean JewettJames Girardin admitted to [facility] for Hyperkalemia [E87.5]  Patient was scheduled for AVG angiogram due to clotted dialysis access.   Procedure was cancelled due to hyperkalemia, potassium of 5.6.   Objective:  Vital signs in last 24 hours:  Temp:  [97.6 F (36.4 C)-98.8 F (37.1 C)] 97.6 F (36.4 C) (02/01 1750) Pulse Rate:  [69-74] 73 (02/01 2030) Resp:  [12-21] 14 (02/01 2030) BP: (88-156)/(40-132) 88/40 (02/01 2030) SpO2:  [96 %-100 %] 99 % (02/01 2030) Weight:  [98 kg (216 lb)] 98 kg (216 lb) (02/01 1341)  Weight change:  Filed Weights   11/27/17 1341  Weight: 98 kg (216 lb)    Intake/Output: No intake/output data recorded.   Intake/Output this shift:  No intake/output data recorded.  Physical Exam: General: NAD,   Head: Normocephalic, atraumatic. Moist oral mucosal membranes  Eyes: Anicteric, PERRL  Neck: Supple, trachea midline  Lungs:  Clear to auscultation  Heart: Regular rate and rhythm  Abdomen:  Soft, nontender,   Extremities:  no peripheral edema.  Neurologic: Nonfocal, moving all four extremities  Skin: No lesions  Access: Thrombosed right AVG, right temp femoral HD catheter 2/1 Dr. Gilda CreaseSchnier    Basic Metabolic Panel: No results for input(s): NA, K, CL, CO2, GLUCOSE, BUN, CREATININE, CALCIUM, MG, PHOS in the last 168 hours.  Liver Function Tests: No results for input(s): AST, ALT, ALKPHOS, BILITOT, PROT, ALBUMIN in the last 168 hours. No results for input(s): LIPASE, AMYLASE in the last 168 hours. No results for input(s): AMMONIA in the last 168 hours.  CBC: No results for input(s): WBC, NEUTROABS, HGB, HCT, MCV, PLT in the last 168 hours.  Cardiac Enzymes: No results for input(s): CKTOTAL, CKMB, CKMBINDEX, TROPONINI in the last 168 hours.  BNP: Invalid input(s): POCBNP  CBG: No results for input(s): GLUCAP in the last 168 hours.  Microbiology: Results for orders placed or  performed during the hospital encounter of 11/27/17  MRSA PCR Screening     Status: None   Collection Time: 11/27/17  4:52 PM  Result Value Ref Range Status   MRSA by PCR NEGATIVE NEGATIVE Final    Comment:        The GeneXpert MRSA Assay (FDA approved for NASAL specimens only), is one component of a comprehensive MRSA colonization surveillance program. It is not intended to diagnose MRSA infection nor to guide or monitor treatment for MRSA infections. Performed at [facility], [address] [place], [place] [address]     Coagulation Studies: No results for input(s): LABPROT, INR in the last 72 hours.  Urinalysis: No results for input(s): COLORURINE, LABSPEC, PHURINE, GLUCOSEU, HGBUR, BILIRUBINUR, KETONESUR, PROTEINUR, UROBILINOGEN, NITRITE, LEUKOCYTESUR in the last 72 hours.  Invalid input(s): APPERANCEUR    Imaging: No results found.   Medications:    . calcium acetate  1,334 mg Oral TID WC  . cholecalciferol  1,000 Units Oral Daily  . cinacalcet  120 mg Oral Q breakfast  . clonazePAM  0.5 mg Oral BID  . docusate sodium  100 mg Oral BID  . feeding supplement (NEPRO CARB STEADY)  237 mL Oral Daily  . Ferrous Fumarate  1 tablet Oral Daily  . folic acid  500 mcg Oral Daily  . [START ON 11/28/2017] haloperidol  5 mg Oral Q T,Th,Sat-1800  . heparin  5,000 Units Subcutaneous Q8H  . [START ON 11/28/2017] Influenza vac split quadrivalent PF  0.5 mL Intramuscular Tomorrow-1000  .  levothyroxine  600 mcg Oral QAC breakfast  . metolazone  2.5 mg Oral Daily  . multivitamin  1 tablet Oral Daily  . NEPRO  240 mL Oral Daily  . omega-3 acid ethyl esters  1 g Oral BID  . Oxcarbazepine  600 mg Oral BID  . pantoprazole  40 mg Oral Daily  . [START ON 11/28/2017] phenytoin  100 mg Oral Daily  . phenytoin  300 mg Oral Daily  . QUEtiapine  300 mg Oral BID  . senna-docusate  1 tablet Oral Daily   docusate sodium, HYDROmorphone (DILAUDID) injection, ondansetron (ZOFRAN) IV,  oxyCODONE-acetaminophen  Assessment/ Plan:  Mr. Daishawn Lauf is a 67 y.o. black male with end stage renal disease on hemodialysis, hypertension, mental retardation, seizure disorder, hypothyroidism.   [facility] TTS [employer]. Right AVG EDW 91.5kg  1. End Stage Renal Disease with hyperkalemia. Last dialysis was Tuesday. Complication of dialysis device, thrombosed AVG. Now with temp HD femoral catheter - Hemodialysis through temp HD catheter - Plan for declot Monday or Tuesday.  - TTS schedule. 1K bath as outpatient.   2. Hypertension: blood pressure elevated due to agitation.  Currently does not take any bp medications at home.   3. Anemia of chronic kidney disease:  - EPO with HD treatment - Iron supplements.   4. Secondary Hyperparathyroidism: outpatient PTH, calcium and phosphorus at goal - Continue calcium acetate 2 tabs with each meal.     LOS: 0 Abdulhamid Olgin 2/1/20199:07 PM

## 2017-11-27 NOTE — H&P (Signed)
[facility] Admission History & Physical  MRN : [id number]  English [name] Wiley is a 67 y.o. ([birth year]) male who presents with chief complaint of No chief complaint on file. Marland Kitchen  History of Present Illness: I am asked to evaluate the patient by the dialysis center. The patient was sent here because they were unable to cannulate the right arm AV graft yesterday. Furthermore the Center states there is no thrill or bruit. The patient states this is the first dialysis run to be missed. This problem is acute in onset and has been present for approximately 2 days. The patient is unaware of any other change.  Patient denies pain or tenderness overlying the access.  There is no pain with dialysis.  The patient denies hand pain or finger pain consistent with steal syndrome.   There have been past interventions or declots of this access.  The patient is not chronically hypotensive on dialysis.  Current Facility-Administered Medications  Medication Dose Route Frequency Provider Last Rate Last Dose  . 0.9 %  sodium chloride infusion   Intravenous Continuous Stegmayer, Kimberly A, PA-C      . ceFAZolin (ANCEF) IVPB 1 g/50 mL premix  1 g Intravenous Once Stegmayer, Kimberly A, PA-C      . famotidine (PEPCID) tablet 40 mg  40 mg Oral PRN Stegmayer, Ranae Plumber, PA-C      . HYDROmorphone (DILAUDID) injection 1 mg  1 mg Intravenous Once PRN Stegmayer, Kimberly A, PA-C      . methylPREDNISolone sodium succinate (SOLU-MEDROL) 125 mg/2 mL injection 125 mg  125 mg Intravenous PRN Stegmayer, Kimberly A, PA-C      . ondansetron (ZOFRAN) injection 4 mg  4 mg Intravenous Q6H PRN Stegmayer, Ranae Plumber, PA-C        Past Medical History:  Diagnosis Date  . Anxiety   . CAD (coronary artery disease)   . Dialysis patient (HCC)   . GERD (gastroesophageal reflux disease)   . Hypertension   . Hypothyroid   . Intellectual disability   . Renal disorder   . Seizures (HCC)     Past Surgical  History:  Procedure Laterality Date  . AV FISTULA PLACEMENT Right   . FISTULOGRAM Right 03/06/2015   Procedure: Fistulogram;  Surgeon: Renford Dills, MD;  Location: [facility] INVASIVE CV LAB;  Service: Cardiovascular;  Laterality: Right;  . INSERT / REPLACE / REMOVE PACEMAKER    . PERIPHERAL VASCULAR CATHETERIZATION N/A 05/01/2016   Procedure: A/V Shunt Intervention/declot, possible permcath placement;  Surgeon: Annice Needy, MD;  Location: [facility] INVASIVE CV LAB;  Service: Cardiovascular;  Laterality: N/A;    Social History Social History   Tobacco Use  . Smoking status: Never Smoker  . Smokeless tobacco: Never Used  Substance Use Topics  . Alcohol use: No  . Drug use: No    Family History Family History  Problem Relation Age of Onset  . Congestive Heart Failure Father     No family history of bleeding or clotting disorders, autoimmune disease or porphyria  No Known Allergies   REVIEW OF SYSTEMS (Negative unless checked)  Constitutional: [] Weight loss  [] Fever  [] Chills Cardiac: [] Chest pain   [] Chest pressure   [] Palpitations   [] Shortness of breath when laying flat   [] Shortness of breath at rest   [x] Shortness of breath with exertion. Vascular:  [] Pain in legs with walking   [] Pain in legs at rest   [] Pain in legs when laying flat   [] Claudication   []   Pain in feet when walking  [] Pain in feet at rest  [] Pain in feet when laying flat   [] History of DVT   [] Phlebitis   [] Swelling in legs   [] Varicose veins   [] Non-healing ulcers Pulmonary:   [] Uses home oxygen   [] Productive cough   [] Hemoptysis   [] Wheeze  [] COPD   [] Asthma Neurologic:  [] Dizziness  [] Blackouts   [] Seizures   [] History of stroke   [] History of TIA  [] Aphasia   [] Temporary blindness   [] Dysphagia   [] Weakness or numbness in arms   [] Weakness or numbness in legs Musculoskeletal:  [] Arthritis   [] Joint swelling   [] Joint pain   [] Low back pain Hematologic:  [] Easy bruising  [] Easy bleeding   [] Hypercoagulable state    [] Anemic  [] Hepatitis Gastrointestinal:  [] Blood in stool   [] Vomiting blood  [] Gastroesophageal reflux/heartburn   [] Difficulty swallowing. Genitourinary:  [x] Chronic kidney disease   [] Difficult urination  [] Frequent urination  [] Burning with urination   [] Blood in urine Skin:  [] Rashes   [] Ulcers   [] Wounds Psychological:  [] History of anxiety   []  History of major depression.  Physical Examination  Vitals:   11/27/17 1341  BP: (!) 155/74  Pulse: 70  Resp: 20  Temp: 98.8 F (37.1 C)  TempSrc: Oral  SpO2: 96%  Weight: 216 lb (98 kg)  Height: 5\' 7"  (1.702 m)   Body mass index is 33.83 kg/m. Gen: WD/WN, NAD Head: [place]/AT, No temporalis wasting. Prominent temp pulse not noted. Ear/Nose/Throat: Hearing grossly intact, nares w/o erythema or drainage, oropharynx w/o Erythema/Exudate,  Eyes: Conjunctiva clear, sclera non-icteric Neck: Trachea midline.  No JVD.  Pulmonary:  Good air movement, respirations not labored, no use of accessory muscles.  Cardiac: RRR, normal S1, S2. Vascular: Right arm AV graft no thrill no bruit Vessel Right Left  Radial Palpable Palpable  Ulnar Not Palpable Not Palpable  Brachial Palpable Palpable  Carotid Palpable, without bruit Palpable, without bruit  Gastrointestinal: soft, non-tender/non-distended. No guarding/reflex.  Musculoskeletal: M/S 5/5 throughout.  Extremities without ischemic changes.  No deformity or atrophy.  Neurologic: Sensation grossly intact in extremities.  Symmetrical.  Speech is fluent. Motor exam as listed above. Psychiatric: Judgment intact, Mood & affect appropriate for pt's clinical situation. Dermatologic: No rashes or ulcers noted.  No cellulitis or open wounds. Lymph : No Cervical, Axillary, or Inguinal lymphadenopathy.   CBC Lab Results  Component Value Date   WBC 2.9 (L) 09/24/2017   HGB 10.5 (L) 09/24/2017   HCT 31.6 (L) 09/24/2017   MCV 102.2 (H) 09/24/2017   PLT 100 (L) 09/24/2017    BMET    Component Value  Date/Time   NA 138 09/25/2017 0524   NA 133 (L) 02/17/2015 0630   K 4.8 09/25/2017 0524   K 5.6 (H) 02/19/2015 0447   CL 96 (L) 09/25/2017 0524   CL 98 (L) 02/17/2015 0630   CO2 28 09/25/2017 0524   CO2 19 (L) 02/17/2015 0630   GLUCOSE 75 09/25/2017 0524   GLUCOSE 69 02/17/2015 0630   BUN 51 (H) 09/25/2017 0524   BUN 90 (H) 02/17/2015 0630   CREATININE 6.97 (H) 09/25/2017 0524   CREATININE 10.06 (H) 02/17/2015 0630   CALCIUM 8.5 (L) 09/25/2017 0524   CALCIUM 8.1 (L) 02/17/2015 0630   GFRNONAA 7 (L) 09/25/2017 0524   GFRNONAA 5 (L) 02/17/2015 0630   GFRAA 8 (L) 09/25/2017 0524   GFRAA 6 (L) 02/17/2015 0630   CrCl cannot be calculated (Patient's most recent lab result  is older than the maximum 21 days allowed.).  COAG Lab Results  Component Value Date   INR 1.27 04/30/2016   INR 1.7 01/14/2012   INR 2.6 01/08/2012    Radiology No results found.  Assessment/Plan 1.  Complication dialysis device with thrombosis AV access:  Patient's right arm dialysis access is thrombosed. The patient will undergo thrombectomy using interventional techniques.  The risks and benefits were described to the patient.  All questions were answered.  The patient agrees to proceed with angiography and intervention. Potassium will be drawn to ensure that it is an appropriate level prior to performing thrombectomy. 2.  End-stage renal disease requiring hemodialysis:  Patient will continue dialysis therapy without further interruption if a successful thrombectomy is not achieved then catheter will be placed. Dialysis has already been arranged since the patient missed their previous session 3.  Hypertension:  Patient will continue medical management; nephrology is following no changes in oral medications. 4. Diabetes mellitus:  Glucose will be monitored and oral medications been held this morning once the patient has undergone the patient's procedure po intake will be reinitiated and again Accu-Cheks will be  used to assess the blood glucose level and treat as needed. The patient will be restarted on the patient's usual hypoglycemic regime 5.  Coronary artery disease:  EKG will be monitored. Nitrates will be used if needed. The patient's oral cardiac medications will be continued.    [name] Dredge, MD  11/27/2017 1:48 PM

## 2017-11-27 NOTE — Progress Notes (Signed)
HD Tx ended  

## 2017-11-27 NOTE — OR Nursing (Signed)
Patient to be admitted r/t ^K+ .Marland Kitchen.Marland Kitchen.Brother Gardiner Barefootathaniel called and notified and [employer] notified ...spoke with RN Wilhemina CashLaVonda

## 2017-11-27 NOTE — Progress Notes (Signed)
Post HD Tx  

## 2017-11-28 LAB — BASIC METABOLIC PANEL
ANION GAP: 14 (ref 5–15)
BUN: 72 mg/dL — AB (ref 6–20)
CHLORIDE: 96 mmol/L — AB (ref 101–111)
CO2: 27 mmol/L (ref 22–32)
Calcium: 8.5 mg/dL — ABNORMAL LOW (ref 8.9–10.3)
Creatinine, Ser: 8.37 mg/dL — ABNORMAL HIGH (ref 0.61–1.24)
GFR calc non Af Amer: 6 mL/min — ABNORMAL LOW (ref >60)
GFR, EST AFRICAN AMERICAN: 7 mL/min — AB (ref >60)
Glucose, Bld: 85 mg/dL (ref 65–99)
POTASSIUM: 5.3 mmol/L — AB (ref 3.5–5.1)
SODIUM: 137 mmol/L (ref 135–145)

## 2017-11-28 LAB — CBC
HEMATOCRIT: 35.5 % — AB (ref 40.0–52.0)
HEMOGLOBIN: 11.5 g/dL — AB (ref 13.0–18.0)
MCH: 33.7 pg (ref 26.0–34.0)
MCHC: 32.4 g/dL (ref 32.0–36.0)
MCV: 104 fL — ABNORMAL HIGH (ref 80.0–100.0)
Platelets: 118 10*3/uL — ABNORMAL LOW (ref 150–440)
RBC: 3.42 MIL/uL — AB (ref 4.40–5.90)
RDW: 18.5 % — ABNORMAL HIGH (ref 11.5–14.5)
WBC: 2.5 10*3/uL — AB (ref 3.8–10.6)

## 2017-11-28 MED ORDER — HALOPERIDOL 5 MG PO TABS
5.0000 mg | ORAL_TABLET | ORAL | Status: DC
  Administered 2017-11-28 – 2017-12-01 (×2): 5 mg via ORAL
  Filled 2017-11-28: qty 1
  Filled 2017-11-28: qty 5

## 2017-11-28 MED ORDER — LORAZEPAM 2 MG/ML IJ SOLN
0.5000 mg | Freq: Four times a day (QID) | INTRAMUSCULAR | Status: DC | PRN
  Administered 2017-11-28 – 2017-12-02 (×2): 0.5 mg via INTRAVENOUS
  Filled 2017-11-28 (×2): qty 1

## 2017-11-28 NOTE — Progress Notes (Signed)
[facility] [place] [facility]   PATIENT NAME: [name] Wiley    MR#:  [id number]  DATE OF BIRTH:  03/11/1951  SUBJECTIVE:  CHIEF COMPLAINT:  No chief complaint on file.   REVIEW OF SYSTEMS:  CONSTITUTIONAL: No fever, fatigue or weakness.  EYES: No blurred or double vision.  EARS, NOSE, AND THROAT: No tinnitus or ear pain.  RESPIRATORY: No cough, shortness of breath, wheezing or hemoptysis.  CARDIOVASCULAR: No chest pain, orthopnea, edema.  GASTROINTESTINAL: No nausea, vomiting, diarrhea or abdominal pain.  GENITOURINARY: No dysuria, hematuria.  ENDOCRINE: No polyuria, nocturia,  HEMATOLOGY: No anemia, easy bruising or bleeding SKIN: No rash or lesion. MUSCULOSKELETAL: No joint pain or arthritis.   NEUROLOGIC: No tingling, numbness, weakness.  PSYCHIATRY: No anxiety or depression.   ROS  DRUG ALLERGIES:  No Known Allergies  VITALS:  Blood pressure (!) 119/47, pulse 69, temperature 98.5 F (36.9 C), temperature source Oral, resp. rate 20, height 5\' 7"  (1.702 m), weight 95.3 kg (210 lb 1.6 oz), SpO2 100 %.  PHYSICAL EXAMINATION:  GENERAL:  67 y.o.-year-old patient lying in the bed with no acute distress.  EYES: Pupils equal, round, reactive to light and accommodation. No scleral icterus. Extraocular muscles intact.  HEENT: Head atraumatic, normocephalic. Oropharynx and nasopharynx clear.  NECK:  Supple, no jugular venous distention. No thyroid enlargement, no tenderness.  LUNGS: Normal breath sounds bilaterally, no wheezing, rales,rhonchi or crepitation. No use of accessory muscles of respiration.  CARDIOVASCULAR: S1, S2 normal. No murmurs, rubs, or gallops.  ABDOMEN: Soft, nontender, nondistended. Bowel sounds present. No organomegaly or mass.  EXTREMITIES: No pedal edema, cyanosis, or clubbing. Right arm AV fistula. Right groin Temporary Dialysis Catheter. NEUROLOGIC: Cranial nerves II through XII are intact. Muscle strength 4-5/5 in all extremities.  Sensation intact. Gait not checked. Muffled speech, appears baseline. PSYCHIATRIC: The patient is alert and oriented x 3.  SKIN: No obvious rash, lesion, or ulcer.   Physical Exam LABORATORY PANEL:   CBC Recent Labs  Lab 11/28/17 0526  WBC 2.5*  HGB 11.5*  HCT 35.5*  PLT 118*   ------------------------------------------------------------------------------------------------------------------  Chemistries  Recent Labs  Lab 11/28/17 0526  NA 137  K 5.3*  CL 96*  CO2 27  GLUCOSE 85  BUN 72*  CREATININE 8.37*  CALCIUM 8.5*   ------------------------------------------------------------------------------------------------------------------  Cardiac Enzymes No results for input(s): TROPONINI in the last 168 hours. ------------------------------------------------------------------------------------------------------------------  RADIOLOGY:  No results found.  ASSESSMENT AND PLAN:   Principal Problem:   Hyperkalemia  * Hyperkalemia   Vascular had place temp cath and Nephro head done hemodialysis.  * Malfunctioning AV fistula   Declotting procedure for Monday or Tuesday by vascular.  * ESRD on HD   Nephro to do HD>  * Hypothyroidism   Cont home meds.  * seizures   Cont phenytoin.    All the records are reviewed and case discussed with Care Management/Social Workerr. Management plans discussed with the patient, family and they are in agreement.  CODE STATUS: full.  TOTAL TIME TAKING CARE OF THIS PATIENT: 35 minutes.    POSSIBLE D/C IN 1-2 DAYS, DEPENDING ON CLINICAL CONDITION.   Altamese DillingVaibhavkumar Jaylene Arrowood M.D on 11/28/2017   Between 7am to 6pm - Pager - [phone]  After 6pm go to [email or web address] - password [email or web address]  [facility]  Office  [phone]  CC: Primary care physician; Clinic, General Medical  Note: This dictation was prepared with Dragon dictation along with smaller phrase technology. Any transcriptional errors that  result from this process are unintentional.

## 2017-11-28 NOTE — Progress Notes (Signed)
[facility]  ROUNDING NOTE   Subjective:   Hemodialysis yesterday. 2K bath. Tolerated treatment well. UF of 3026. Rigth Femoral temp HD catheter  Family at bedside. Patient with agitation.   Objective:  Vital signs in last 24 hours:  Temp:  [97.4 F (36.3 C)-98.8 F (37.1 C)] 97.5 F (36.4 C) (02/02 1243) Pulse Rate:  [69-74] 70 (02/02 1243) Resp:  [12-21] 18 (02/02 1243) BP: (88-156)/(40-132) 116/61 (02/02 1243) SpO2:  [96 %-100 %] 100 % (02/02 1243) Weight:  [95.3 kg (210 lb 1.6 oz)-98 kg (216 lb)] 95.3 kg (210 lb 1.6 oz) (02/01 2115)  Weight change:  Filed Weights   11/27/17 1341 11/27/17 2115  Weight: 98 kg (216 lb) 95.3 kg (210 lb 1.6 oz)    Intake/Output: I/O last 3 completed shifts: In: -  Out: 3026 [Other:3026]   Intake/Output this shift:  Total I/O In: 470 [P.O.:470] Out: 0   Physical Exam: General: NAD  Head: Normocephalic, atraumatic. Moist oral mucosal membranes  Eyes: Anicteric, PERRL  Neck: Supple, trachea midline  Lungs:  Clear to auscultation  Heart: Regular rate and rhythm  Abdomen:  Soft, nontender,   Extremities:  no peripheral edema.  Neurologic: Nonfocal, moving all four extremities  Skin: No lesions  Access: Thrombosed right AVG, right temp femoral HD catheter 2/1 Dr. Gilda CreaseSchnier    Basic Metabolic Panel: Recent Labs  Lab 11/27/17 2034 11/28/17 0526  NA 135 137  K 4.2 5.3*  CL 93* 96*  CO2 26 27  GLUCOSE 70 85  BUN 59* 72*  CREATININE 6.78* 8.37*  CALCIUM 8.9 8.5*    Liver Function Tests: No results for input(s): AST, ALT, ALKPHOS, BILITOT, PROT, ALBUMIN in the last 168 hours. No results for input(s): LIPASE, AMYLASE in the last 168 hours. No results for input(s): AMMONIA in the last 168 hours.  CBC: Recent Labs  Lab 11/28/17 0526  WBC 2.5*  HGB 11.5*  HCT 35.5*  MCV 104.0*  PLT 118*    Cardiac Enzymes: No results for input(s): CKTOTAL, CKMB, CKMBINDEX, TROPONINI in the last 168 hours.  BNP: Invalid  input(s): POCBNP  CBG: No results for input(s): GLUCAP in the last 168 hours.  Microbiology: Results for orders placed or performed during the hospital encounter of 11/27/17  MRSA PCR Screening     Status: None   Collection Time: 11/27/17  4:52 PM  Result Value Ref Range Status   MRSA by PCR NEGATIVE NEGATIVE Final    Comment:        The GeneXpert MRSA Assay (FDA approved for NASAL specimens only), is one component of a comprehensive MRSA colonization surveillance program. It is not intended to diagnose MRSA infection nor to guide or monitor treatment for MRSA infections. Performed at [facility], [address] [place], [place] [address]     Coagulation Studies: No results for input(s): LABPROT, INR in the last 72 hours.  Urinalysis: No results for input(s): COLORURINE, LABSPEC, PHURINE, GLUCOSEU, HGBUR, BILIRUBINUR, KETONESUR, PROTEINUR, UROBILINOGEN, NITRITE, LEUKOCYTESUR in the last 72 hours.  Invalid input(s): APPERANCEUR    Imaging: No results found.   Medications:    . calcium acetate  1,334 mg Oral TID WC  . cholecalciferol  1,000 Units Oral Daily  . cinacalcet  120 mg Oral Q breakfast  . clonazePAM  0.5 mg Oral BID  . docusate sodium  100 mg Oral BID  . feeding supplement (NEPRO CARB STEADY)  237 mL Oral Daily  . Ferrous Fumarate  1 tablet Oral  Daily  . folic acid  500 mcg Oral Daily  . haloperidol  5 mg Oral Q T,Th,Sat-1800  . heparin  5,000 Units Subcutaneous Q8H  . Influenza vac split quadrivalent PF  0.5 mL Intramuscular Tomorrow-1000  . levothyroxine  600 mcg Oral QAC breakfast  . metolazone  2.5 mg Oral Daily  . multivitamin  1 tablet Oral Daily  . NEPRO  240 mL Oral Daily  . omega-3 acid ethyl esters  1 g Oral BID  . Oxcarbazepine  600 mg Oral BID  . pantoprazole  40 mg Oral Daily  . phenytoin  100 mg Oral Daily  . phenytoin  300 mg Oral Daily  . QUEtiapine  300 mg Oral BID  . senna-docusate  1 tablet Oral Daily   docusate  sodium, HYDROmorphone (DILAUDID) injection, LORazepam, ondansetron (ZOFRAN) IV, oxyCODONE-acetaminophen  Assessment/ Plan:  [name] is a 67 y.o. black male with end stage renal disease on hemodialysis, hypertension, mental retardation, seizure disorder, hypothyroidism.   [facility] TTS [employer]. Right AVG EDW 91.5kg  1. End Stage Renal Disease with hyperkalemia. Last dialysis was Tuesday. Complication of dialysis device, thrombosed AVG. Now with temp HD femoral catheter - Hemodialysis through temp HD catheter last night.  - Plan for declot Monday or Tuesday.  - TTS schedule. 1K bath as outpatient. Next hemodialysis treatment most likely for Monday.  - Low potassium diet.   2. Hypertension: blood pressure at goal.  Currently does not take any bp medications at home.   3. Anemia of chronic kidney disease:  - EPO with HD treatment - Iron supplements.   4. Secondary Hyperparathyroidism: outpatient PTH, calcium and phosphorus at goal - Continue calcium acetate 2 tabs with each meal.    LOS: 1 Waldine Zenz 2/2/20191:11 PM

## 2017-11-29 LAB — HEPATITIS B SURFACE ANTIGEN: Hepatitis B Surface Ag: NEGATIVE

## 2017-11-29 LAB — HEPATITIS B SURFACE ANTIBODY, QUANTITATIVE: Hepatitis B-Post: 22.9 m[IU]/mL (ref >9.9)

## 2017-11-29 NOTE — Progress Notes (Signed)
[facility]  ROUNDING NOTE   Subjective:   Patient pleasant this morning.   Objective:  Vital signs in last 24 hours:  Temp:  [97.4 F (36.3 C)-98 F (36.7 C)] 97.4 F (36.3 C) (02/03 0436) Pulse Rate:  [69-70] 70 (02/03 0436) Resp:  [18-20] 20 (02/03 0436) BP: (116-149)/(53-67) 149/67 (02/03 0436) SpO2:  [96 %-100 %] 96 % (02/03 0436)  Weight change:  Filed Weights   11/27/17 1341 11/27/17 2115  Weight: 98 kg (216 lb) 95.3 kg (210 lb 1.6 oz)    Intake/Output: I/O last 3 completed shifts: In: 710 [P.O.:710] Out: 3026 [Other:3026]   Intake/Output this shift:  Total I/O In: 360 [P.O.:360] Out: -   Physical Exam: General: NAD  Head: Normocephalic, atraumatic. Moist oral mucosal membranes  Eyes: Anicteric, PERRL  Neck: Supple, trachea midline  Lungs:  Clear to auscultation  Heart: Regular rate and rhythm  Abdomen:  Soft, nontender,   Extremities:  no peripheral edema.  Neurologic: moving all four extremities  Skin: No lesions  Access: Thrombosed right AVG, right temp femoral HD catheter 2/1 Dr. Gilda Crease    Basic Metabolic Panel: Recent Labs  Lab 11/27/17 2034 11/28/17 0526  NA 135 137  K 4.2 5.3*  CL 93* 96*  CO2 26 27  GLUCOSE 70 85  BUN 59* 72*  CREATININE 6.78* 8.37*  CALCIUM 8.9 8.5*    Liver Function Tests: No results for input(s): AST, ALT, ALKPHOS, BILITOT, PROT, ALBUMIN in the last 168 hours. No results for input(s): LIPASE, AMYLASE in the last 168 hours. No results for input(s): AMMONIA in the last 168 hours.  CBC: Recent Labs  Lab 11/28/17 0526  WBC 2.5*  HGB 11.5*  HCT 35.5*  MCV 104.0*  PLT 118*    Cardiac Enzymes: No results for input(s): CKTOTAL, CKMB, CKMBINDEX, TROPONINI in the last 168 hours.  BNP: Invalid input(s): POCBNP  CBG: No results for input(s): GLUCAP in the last 168 hours.  Microbiology: Results for orders placed or performed during the hospital encounter of 11/27/17  MRSA PCR Screening      Status: None   Collection Time: 11/27/17  4:52 PM  Result Value Ref Range Status   MRSA by PCR NEGATIVE NEGATIVE Final    Comment:        The GeneXpert MRSA Assay (FDA approved for NASAL specimens only), is one component of a comprehensive MRSA colonization surveillance program. It is not intended to diagnose MRSA infection nor to guide or monitor treatment for MRSA infections. Performed at [facility], [address] [place], [place] [address]     Coagulation Studies: No results for input(s): LABPROT, INR in the last 72 hours.  Urinalysis: No results for input(s): COLORURINE, LABSPEC, PHURINE, GLUCOSEU, HGBUR, BILIRUBINUR, KETONESUR, PROTEINUR, UROBILINOGEN, NITRITE, LEUKOCYTESUR in the last 72 hours.  Invalid input(s): APPERANCEUR    Imaging: No results found.   Medications:    . calcium acetate  1,334 mg Oral TID WC  . cholecalciferol  1,000 Units Oral Daily  . cinacalcet  120 mg Oral Q breakfast  . clonazePAM  0.5 mg Oral BID  . docusate sodium  100 mg Oral BID  . feeding supplement (NEPRO CARB STEADY)  237 mL Oral Daily  . Ferrous Fumarate  1 tablet Oral Daily  . folic acid  500 mcg Oral Daily  . haloperidol  5 mg Oral Q T,Th,Sat-1800  . heparin  5,000 Units Subcutaneous Q8H  . Influenza vac split quadrivalent PF  0.5 mL Intramuscular  Tomorrow-1000  . levothyroxine  600 mcg Oral QAC breakfast  . metolazone  2.5 mg Oral Daily  . multivitamin  1 tablet Oral Daily  . omega-3 acid ethyl esters  1 g Oral BID  . Oxcarbazepine  600 mg Oral BID  . pantoprazole  40 mg Oral Daily  . phenytoin  100 mg Oral Daily  . phenytoin  300 mg Oral Daily  . QUEtiapine  300 mg Oral BID  . senna-docusate  1 tablet Oral Daily   docusate sodium, HYDROmorphone (DILAUDID) injection, LORazepam, ondansetron (ZOFRAN) IV, oxyCODONE-acetaminophen  Assessment/ Plan:  [name] is a 67 y.o. black male with end stage renal disease on hemodialysis, hypertension, mental  retardation, seizure disorder, hypothyroidism.   [facility] Right AVG EDW 91.5kg  1. End Stage Renal Disease with hyperkalemia. Last dialysis was Friday with temp HD catheter.  Complication of dialysis device, thrombosed AVG. Now with temp HD femoral catheter placed on 2/1 Dr. Gilda CreaseSchnier.  - Hemodialysis through temp HD catheter for tomorrow. 1K bath.   - Plan for declot Monday or Tuesday.  - Low potassium diet.   2. Hypertension: blood pressure at goal.  Currently does not take any bp medications at home.   3. Anemia of chronic kidney disease: hemoglobin 11.5 - EPO with hemodialysis treatments.  - Iron supplements.   4. Secondary Hyperparathyroidism: outpatient PTH, calcium and phosphorus at goal - Continue calcium acetate 2 tabs with each meal.    LOS: 2 [name] Wiley 2/3/201912:34 PM

## 2017-11-29 NOTE — Progress Notes (Addendum)
RN notified on call nephrologist to see if RN could use purple lumen of trialysis catheter if needed, pt removed his peripheral IV. Verbal order that RN may use purple lumen of trialysis catheter if needed, order was placed. Will continue to monitor pt.   [name] Wiley [place]

## 2017-11-29 NOTE — Progress Notes (Signed)
[facility] [place] [facility]   PATIENT NAME: [name] Wiley    MR#:  [id number]  DATE OF BIRTH:  01/23/1951  SUBJECTIVE:  CHIEF COMPLAINT:  No chief complaint on file.   REVIEW OF SYSTEMS:  CONSTITUTIONAL: No fever, fatigue or weakness.  EYES: No blurred or double vision.  EARS, NOSE, AND THROAT: No tinnitus or ear pain.  RESPIRATORY: No cough, shortness of breath, wheezing or hemoptysis.  CARDIOVASCULAR: No chest pain, orthopnea, edema.  GASTROINTESTINAL: No nausea, vomiting, diarrhea or abdominal pain.  GENITOURINARY: No dysuria, hematuria.  ENDOCRINE: No polyuria, nocturia,  HEMATOLOGY: No anemia, easy bruising or bleeding SKIN: No rash or lesion. MUSCULOSKELETAL: No joint pain or arthritis.   NEUROLOGIC: No tingling, numbness, weakness.  PSYCHIATRY: No anxiety or depression.   ROS  DRUG ALLERGIES:  No Known Allergies  VITALS:  Blood pressure (!) 149/67, pulse 70, temperature (!) 97.4 F (36.3 C), temperature source Oral, resp. rate 20, height 5\' 7"  (1.702 m), weight 95.3 kg (210 lb 1.6 oz), SpO2 96 %.  PHYSICAL EXAMINATION:  GENERAL:  67 y.o.-year-old patient lying in the bed with no acute distress.  EYES: Pupils equal, round, reactive to light and accommodation. No scleral icterus. Extraocular muscles intact.  HEENT: Head atraumatic, normocephalic. Oropharynx and nasopharynx clear.  NECK:  Supple, no jugular venous distention. No thyroid enlargement, no tenderness.  LUNGS: Normal breath sounds bilaterally, no wheezing, rales,rhonchi or crepitation. No use of accessory muscles of respiration.  CARDIOVASCULAR: S1, S2 normal. No murmurs, rubs, or gallops.  ABDOMEN: Soft, nontender, nondistended. Bowel sounds present. No organomegaly or mass.  EXTREMITIES: No pedal edema, cyanosis, or clubbing. Right arm AV fistula. Right groin Temporary Dialysis Catheter. NEUROLOGIC: Cranial nerves II through XII are intact. Muscle strength 4-5/5 in all extremities.  Sensation intact. Gait not checked. Muffled speech, appears baseline. PSYCHIATRIC: The patient is alert and oriented x 3.  SKIN: No obvious rash, lesion, or ulcer.   Physical Exam LABORATORY PANEL:   CBC Recent Labs  Lab 11/28/17 0526  WBC 2.5*  HGB 11.5*  HCT 35.5*  PLT 118*   ------------------------------------------------------------------------------------------------------------------  Chemistries  Recent Labs  Lab 11/28/17 0526  NA 137  K 5.3*  CL 96*  CO2 27  GLUCOSE 85  BUN 72*  CREATININE 8.37*  CALCIUM 8.5*   ------------------------------------------------------------------------------------------------------------------  Cardiac Enzymes No results for input(s): TROPONINI in the last 168 hours. ------------------------------------------------------------------------------------------------------------------  RADIOLOGY:  No results found.  ASSESSMENT AND PLAN:   Principal Problem:   Hyperkalemia  * Hyperkalemia   Vascular had place temp cath and Nephro head done hemodialysis.  * Malfunctioning AV fistula   Declotting procedure for Monday or Tuesday by vascular.  * ESRD on HD   Nephro to do HD.  * Hypothyroidism   Cont home meds.  * seizures   Cont phenytoin.   All the records are reviewed and case discussed with Care Management/Social Workerr. Management plans discussed with the patient, family and they are in agreement.  CODE STATUS: full.  TOTAL TIME TAKING CARE OF THIS PATIENT: 35 minutes.    POSSIBLE D/C IN 1-2 DAYS, DEPENDING ON CLINICAL CONDITION.   Altamese DillingVaibhavkumar Shiya Fogelman M.D on 11/29/2017   Between 7am to 6pm - Pager - [phone]  After 6pm go to [email or web address] - password [email or web address]  [facility]  Office  [phone]  CC: Primary care physician; Clinic, General Medical  Note: This dictation was prepared with Dragon dictation along with smaller phrase technology. Any transcriptional errors that result  from this process are unintentional.

## 2017-11-30 ENCOUNTER — Encounter: Payer: Self-pay | Admitting: Vascular Surgery

## 2017-11-30 LAB — BASIC METABOLIC PANEL
Anion gap: 17 — ABNORMAL HIGH (ref 5–15)
BUN: 107 mg/dL — ABNORMAL HIGH (ref 6–20)
CHLORIDE: 92 mmol/L — AB (ref 101–111)
CO2: 24 mmol/L (ref 22–32)
Calcium: 8.7 mg/dL — ABNORMAL LOW (ref 8.9–10.3)
Creatinine, Ser: 10.9 mg/dL — ABNORMAL HIGH (ref 0.61–1.24)
GFR calc non Af Amer: 4 mL/min — ABNORMAL LOW (ref >60)
GFR, EST AFRICAN AMERICAN: 5 mL/min — AB (ref >60)
Glucose, Bld: 91 mg/dL (ref 65–99)
POTASSIUM: 6.6 mmol/L — AB (ref 3.5–5.1)
SODIUM: 133 mmol/L — AB (ref 135–145)

## 2017-11-30 LAB — CBC
HEMATOCRIT: 33.4 % — AB (ref 40.0–52.0)
Hemoglobin: 10.9 g/dL — ABNORMAL LOW (ref 13.0–18.0)
MCH: 34.2 pg — ABNORMAL HIGH (ref 26.0–34.0)
MCHC: 32.6 g/dL (ref 32.0–36.0)
MCV: 105 fL — AB (ref 80.0–100.0)
PLATELETS: 113 10*3/uL — AB (ref 150–440)
RBC: 3.18 MIL/uL — AB (ref 4.40–5.90)
RDW: 17.5 % — ABNORMAL HIGH (ref 11.5–14.5)
WBC: 2.8 10*3/uL — AB (ref 3.8–10.6)

## 2017-11-30 MED ORDER — EPOETIN ALFA 10000 UNIT/ML IJ SOLN: 4000.0000 [IU] | INTRAMUSCULAR | Status: DC

## 2017-11-30 MED ORDER — SODIUM POLYSTYRENE SULFONATE 15 GM/60ML PO SUSP
15.0000 g | Freq: Once | ORAL | Status: AC
  Administered 2017-11-30: 15 g via ORAL
  Filled 2017-11-30: qty 60

## 2017-11-30 NOTE — Progress Notes (Signed)
HD complete 

## 2017-11-30 NOTE — Progress Notes (Signed)
[facility] [place] [facility]   PATIENT NAME: [name] Wiley    MR#:  [id number]  DATE OF BIRTH:  07/07/1951  SUBJECTIVE:  CHIEF COMPLAINT:  No chief complaint on file.   REVIEW OF SYSTEMS:  CONSTITUTIONAL: No fever, fatigue or weakness.  EYES: No blurred or double vision.  EARS, NOSE, AND THROAT: No tinnitus or ear pain.  RESPIRATORY: No cough, shortness of breath, wheezing or hemoptysis.  CARDIOVASCULAR: No chest pain, orthopnea, edema.  GASTROINTESTINAL: No nausea, vomiting, diarrhea or abdominal pain.  GENITOURINARY: No dysuria, hematuria.  ENDOCRINE: No polyuria, nocturia,  HEMATOLOGY: No anemia, easy bruising or bleeding SKIN: No rash or lesion. MUSCULOSKELETAL: No joint pain or arthritis.   NEUROLOGIC: No tingling, numbness, weakness.  PSYCHIATRY: No anxiety or depression.   ROS  DRUG ALLERGIES:  No Known Allergies  VITALS:  Blood pressure 104/75, pulse 78, temperature 98.1 F (36.7 C), temperature source Oral, resp. rate 17, height 5\' 7"  (1.702 m), weight 97.6 kg (215 lb 2.7 oz), SpO2 100 %.  PHYSICAL EXAMINATION:  GENERAL:  67 y.o.-year-old patient lying in the bed with no acute distress.  EYES: Pupils equal, round, reactive to light and accommodation. No scleral icterus. Extraocular muscles intact.  HEENT: Head atraumatic, normocephalic. Oropharynx and nasopharynx clear.  NECK:  Supple, no jugular venous distention. No thyroid enlargement, no tenderness.  LUNGS: Normal breath sounds bilaterally, no wheezing, rales,rhonchi or crepitation. No use of accessory muscles of respiration.  CARDIOVASCULAR: S1, S2 normal. No murmurs, rubs, or gallops.  ABDOMEN: Soft, nontender, nondistended. Bowel sounds present. No organomegaly or mass.  EXTREMITIES: No pedal edema, cyanosis, or clubbing. Right arm AV fistula. Right groin Temporary Dialysis Catheter. NEUROLOGIC: Cranial nerves II through XII are intact. Muscle strength 4-5/5 in all extremities.  Sensation intact. Gait not checked. Muffled speech, appears baseline. PSYCHIATRIC: The patient is alert and oriented x 3.  SKIN: No obvious rash, lesion, or ulcer.   Physical Exam LABORATORY PANEL:   CBC Recent Labs  Lab 11/30/17 0322  WBC 2.8*  HGB 10.9*  HCT 33.4*  PLT 113*   ------------------------------------------------------------------------------------------------------------------  Chemistries  Recent Labs  Lab 11/30/17 0322  NA 133*  K 6.6*  CL 92*  CO2 24  GLUCOSE 91  BUN 107*  CREATININE 10.90*  CALCIUM 8.7*   ------------------------------------------------------------------------------------------------------------------  Cardiac Enzymes No results for input(s): TROPONINI in the last 168 hours. ------------------------------------------------------------------------------------------------------------------  RADIOLOGY:  No results found.  ASSESSMENT AND PLAN:   Principal Problem:   Hyperkalemia  * Hyperkalemia   Vascular had place temp cath and Nephro had done hemodialysis.   Again HD today  * Malfunctioning AV fistula   Declotting procedure for Tuesday by vascular.  * ESRD on HD   Nephro to do HD.  * Hypothyroidism   Cont home meds.  * seizures   Cont phenytoin.   All the records are reviewed and case discussed with Care Management/Social Workerr. Management plans discussed with the patient, family and they are in agreement.  CODE STATUS: full.  TOTAL TIME TAKING CARE OF THIS PATIENT: 35 minutes.    POSSIBLE D/C IN 1-2 DAYS, DEPENDING ON CLINICAL CONDITION.   Altamese DillingVaibhavkumar Jaidin Ugarte M.D on 11/30/2017   Between 7am to 6pm - Pager - [phone]  After 6pm go to [email or web address] - password [email or web address]  [facility]  Office  [phone]  CC: Primary care physician; Clinic, General Medical  Note: This dictation was prepared with Dragon dictation along with smaller phrase technology. Any transcriptional errors  that result from this process are unintentional.

## 2017-11-30 NOTE — NC FL2 (Signed)
[place] MEDICAID FL2 LEVEL OF CARE SCREENING TOOL     IDENTIFICATION  Patient Name: [name] Wiley Birthdate: 29-Sep-1951 Sex: male Admission Date (Current Location): 11/27/2017  [place] and [place] Number:  Chiropodist and Address:  [facility], [address], [place], [place] [address]      Provider Number: [id number]  Attending Physician Name and Address:  Altamese Dilling, *  Relative Name and Phone Number:       Current Level of Care: Hospital Recommended Level of Care: Skilled Nursing Facility Prior Approval Number:    Date Approved/Denied:   PASRR Number:    Discharge Plan: SNF    Current Diagnoses: Patient Active Problem List   Diagnosis Date Noted  . 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 Hyperkalemia 04/26/2016  . Hyponatremia 04/26/2016  . Pancytopenia (HCC) 04/26/2016  . End stage renal disease (HCC) 04/26/2016  . 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    Orientation RESPIRATION BLADDER Height & Weight     Self, Place  Normal Incontinent Weight: 215 lb 2.7 oz (97.6 kg) Height:  5\' 7"  (170.2 cm)  BEHAVIORAL SYMPTOMS/MOOD NEUROLOGICAL BOWEL NUTRITION STATUS  (none) Convulsions/Seizures Continent Diet(renal)  AMBULATORY STATUS COMMUNICATION OF NEEDS Skin   Extensive Assist Non-Verbally Normal                       Personal Care Assistance Level of Assistance  Bathing, Feeding, Dressing Bathing Assistance: Maximum assistance Feeding assistance: Maximum assistance Dressing Assistance: Maximum assistance     Functional Limitations Info  (no issues)          SPECIAL CARE FACTORS FREQUENCY                       Contractures Contractures Info: Not present    Additional Factors Info  Code Status Code Status Info: full             Current Medications (11/30/2017):  This is the current hospital active medication list Current Facility-Administered Medications  Medication Dose Route  Frequency Provider Last Rate Last Dose  . calcium acetate (PHOSLO) capsule 1,334 mg  1,334 mg Oral TID WC Altamese Dilling, MD   Stopped at 11/30/17 1134  . cholecalciferol (VITAMIN D) tablet 1,000 Units  1,000 Units Oral Daily Altamese Dilling, MD   Stopped at 11/30/17 1005  . cinacalcet (SENSIPAR) tablet 120 mg  120 mg Oral Q breakfast Altamese Dilling, MD   Stopped at 11/30/17 [phone]  . clonazePAM (KLONOPIN) tablet 0.5 mg  0.5 mg Oral BID Altamese Dilling, MD   Stopped at 11/30/17 1005  . docusate sodium (COLACE) capsule 100 mg  100 mg Oral BID Altamese Dilling, MD   Stopped at 11/30/17 1005  . docusate sodium (COLACE) capsule 100 mg  100 mg Oral BID PRN Altamese Dilling, MD      . Melene Muller ON 12/01/2017] epoetin alfa (EPOGEN,PROCRIT) injection 4,000 Units  4,000 Units Intravenous Q T,Th,Sa-HD Thedore Mins, Harmeet, MD      . feeding supplement (NEPRO CARB STEADY) liquid 237 mL  237 mL Oral Daily Altamese Dilling, MD   Stopped at 11/30/17 1006  . Ferrous Fumarate (HEMOCYTE - 106 mg FE) tablet 106 mg of iron  1 tablet Oral Daily Altamese Dilling, MD   Stopped at 11/30/17 1006  . folic acid (FOLVITE) tablet 0.5 mg  500 mcg Oral Daily Altamese Dilling, MD   Stopped at 11/30/17 1006  . haloperidol (HALDOL) tablet 5 mg  5  mg Oral Q T,Th,Sat-1800 Kolluru, Sarath, MD   5 mg at 11/28/17 2129  . heparin injection 5,000 Units  5,000 Units Subcutaneous Q8H Altamese DillingVachhani, Vaibhavkumar, MD   5,000 Units at 11/30/17 0505  . HYDROmorphone (DILAUDID) injection 1 mg  1 mg Intravenous Once PRN Stegmayer, Ranae PlumberKimberly A, PA-C      . Influenza vac split quadrivalent PF (FLUZONE HIGH-DOSE) injection 0.5 mL  0.5 mL Intramuscular Tomorrow-1000 Altamese DillingVachhani, Vaibhavkumar, MD      . levothyroxine (SYNTHROID, LEVOTHROID) tablet 600 mcg  600 mcg Oral QAC breakfast Altamese DillingVachhani, Vaibhavkumar, MD   600 mcg at 11/30/17 0834  . LORazepam (ATIVAN) injection 0.5 mg  0.5 mg Intravenous Q6H PRN Kolluru,  Sarath, MD   0.5 mg at 11/28/17 1025  . metolazone (ZAROXOLYN) tablet 2.5 mg  2.5 mg Oral Daily Altamese DillingVachhani, Vaibhavkumar, MD   Stopped at 11/30/17 1008  . multivitamin (RENA-VIT) tablet 1 tablet  1 tablet Oral Daily Altamese DillingVachhani, Vaibhavkumar, MD   Stopped at 11/30/17 1006  . omega-3 acid ethyl esters (LOVAZA) capsule 1 g  1 g Oral BID Altamese DillingVachhani, Vaibhavkumar, MD   Stopped at 11/30/17 1007  . ondansetron (ZOFRAN) injection 4 mg  4 mg Intravenous Q6H PRN Stegmayer, Ranae PlumberKimberly A, PA-C      . Oxcarbazepine (TRILEPTAL) tablet 600 mg  600 mg Oral BID Altamese DillingVachhani, Vaibhavkumar, MD   Stopped at 11/30/17 1007  . oxyCODONE-acetaminophen (PERCOCET/ROXICET) 5-325 MG per tablet 1 tablet  1 tablet Oral Q6H PRN Altamese DillingVachhani, Vaibhavkumar, MD      . pantoprazole (PROTONIX) EC tablet 40 mg  40 mg Oral Daily Altamese DillingVachhani, Vaibhavkumar, MD   Stopped at 11/30/17 1007  . phenytoin (DILANTIN) ER capsule 100 mg  100 mg Oral Daily Altamese DillingVachhani, Vaibhavkumar, MD   100 mg at 11/30/17 0505  . phenytoin (DILANTIN) ER capsule 300 mg  300 mg Oral Daily Altamese DillingVachhani, Vaibhavkumar, MD   300 mg at 11/29/17 2033  . QUEtiapine (SEROQUEL XR) 24 hr tablet 300 mg  300 mg Oral BID Altamese DillingVachhani, Vaibhavkumar, MD   300 mg at 11/30/17 0505  . senna-docusate (Senokot-S) tablet 1 tablet  1 tablet Oral Daily Altamese DillingVachhani, Vaibhavkumar, MD   Stopped at 11/30/17 1007     Discharge Medications: Please see discharge summary for a list of discharge medications.  Relevant Imaging Results:  Relevant Lab Results:   Additional Information    York SpanielMonica Albin Duckett, LCSW

## 2017-11-30 NOTE — Progress Notes (Signed)
HD started. 

## 2017-11-30 NOTE — Clinical Social Work Note (Signed)
Clinical Social Work Assessment  Patient Details  Name: [name] Wiley MRN: [id number] Date of Birth: 08/05/1951  Date of referral:  11/30/17               Reason for consult:  Discharge Planning                Permission sought to share information with:    Permission granted to share information::     Name::        Agency::     Relationship::     Contact Information:     Housing/Transportation Living arrangements for the past 2 months:  Skilled Nursing Facility Source of Information:  Facility, Siblings Patient Interpreter Needed:  None Criminal Activity/Legal Involvement Pertinent to Current Situation/Hospitalization:  No - Comment as needed Significant Relationships:  Siblings Lives with:  Facility Resident Do you feel safe going back to the place where you live?  Yes Need for family participation in patient care:  Yes (Comment)  Care giving concerns:  Patient is a long term resident at [facility].   Social Worker assessment / plan:  CSW informed that patient is a long term resident at [facility] and is known to CSW from previous admissions. CSW contacted patient's brother, [name] Wiley, via phone. CSW introduced self and purpose of call. Patient's brother stated that he does wish for patient to return to [facility] and he has some questions about bed hold, etc. CSW referred him to speak with [facility] regarding bed hold but that patient has been a long term resident of [facility] and that [facility] has taken patient back each time. Patient's brother requests EMS transport when time for discharge.   Employment status:  Disabled (Comment on whether or not currently receiving Disability) Insurance information:  Medicare PT Recommendations:    Information / Referral to community resources:     Patient/Family's Response to care:  Patient's brother expressed appreciation for CSW assistance.  Patient/Family's Understanding of and Emotional Response to Diagnosis, Current Treatment, and Prognosis:   Patient's brother is involved in patient's treatment plan.  Emotional Assessment Appearance:    Attitude/Demeanor/Rapport:    Affect (typically observed):    Orientation:  Oriented to Self, Oriented to Place Alcohol / Substance use:  Not Applicable Psych involvement (Current and /or in the community):  No (Comment)  Discharge Needs  Concerns to be addressed:  Care Coordination Readmission within the last 30 days:  No Current discharge risk:  None Barriers to Discharge:  No Barriers Identified   York SpanielMonica Dyke Weible, LCSW 11/30/2017, 2:43 PM

## 2017-11-30 NOTE — Progress Notes (Signed)
Notified MD of elevated potasium, BUN and creatinine, orders taken, pt will need HD today as well.

## 2017-11-30 NOTE — Progress Notes (Signed)
Pre HD  

## 2017-11-30 NOTE — Progress Notes (Signed)
[facility], [place] 11/30/17  Subjective:   Patient seen during dialysis   HEMODIALYSIS FLOWSHEET:  Blood Flow Rate (mL/min): 200 mL/min Arterial Pressure (mmHg): -200 mmHg Venous Pressure (mmHg): 250 mmHg Transmembrane Pressure (mmHg): 80 mmHg Ultrafiltration Rate (mL/min): 1400 mL/min Dialysate Flow Rate (mL/min): 600 ml/min Conductivity: Machine : 14.1 Conductivity: Machine : 14.1 Dialysis Fluid Bolus: Normal Saline Bolus Amount (mL): 250 mL  Patient undergoing dialysis via right femoral dialysis catheter Potassium level is critically high today at 6.6 Some technical difficulties with machine 1 K bath  Objective:  Vital signs in last 24 hours:  Temp:  [97.6 F (36.4 C)-98 F (36.7 C)] 98 F (36.7 C) (02/04 0455) Pulse Rate:  [69-70] 70 (02/04 0455) Resp:  [18-20] 20 (02/04 0455) BP: (125-146)/(50-95) 142/69 (02/04 0455) SpO2:  [99 %-100 %] 100 % (02/04 0455)  Weight change:  Filed Weights   11/27/17 1341 11/27/17 2115  Weight: 98 kg (216 lb) 95.3 kg (210 lb 1.6 oz)    Intake/Output:    Intake/Output Summary (Last 24 hours) at 11/30/2017 4034 Last data filed at 11/29/2017 1900 Gross per 24 hour  Intake 480 ml  Output -  Net 480 ml     Physical Exam: General:  No acute distress, laying in the bed  HEENT  anicteric  Neck  supple  Pulm/lungs  normal breathing effort, clear  CVS/Heart  regular rhythm  Abdomen:   Soft, nontender  Extremities: + Edema  Neurologic:  Alert, able to answer questions  Skin:  Dry skin  Access:  Clotted AV graft, right femoral catheter       Basic Metabolic Panel:  Recent Labs  Lab 11/27/17 2034 11/28/17 0526 11/30/17 0322  NA 135 137 133*  K 4.2 5.3* 6.6*  CL 93* 96* 92*  CO2 26 27 24   GLUCOSE 70 85 91  BUN 59* 72* 107*  CREATININE 6.78* 8.37* 10.90*  CALCIUM 8.9 8.5* 8.7*     CBC: Recent Labs  Lab 11/28/17 0526 11/30/17 0322  WBC 2.5* 2.8*  HGB 11.5* 10.9*  HCT 35.5* 33.4*   MCV 104.0* 105.0*  PLT 118* 113*      Lab Results  Component Value Date   HEPBSAG Negative 11/27/2017      Microbiology:  Recent Results (from the past 240 hour(s))  MRSA PCR Screening     Status: None   Collection Time: 11/27/17  4:52 PM  Result Value Ref Range Status   MRSA by PCR NEGATIVE NEGATIVE Final    Comment:        The GeneXpert MRSA Assay (FDA approved for NASAL specimens only), is one component of a comprehensive MRSA colonization surveillance program. It is not intended to diagnose MRSA infection nor to guide or monitor treatment for MRSA infections. Performed at [facility], [address] [place], [place] [address]     Coagulation Studies: No results for input(s): LABPROT, INR in the last 72 hours.  Urinalysis: No results for input(s): COLORURINE, LABSPEC, PHURINE, GLUCOSEU, HGBUR, BILIRUBINUR, KETONESUR, PROTEINUR, UROBILINOGEN, NITRITE, LEUKOCYTESUR in the last 72 hours.  Invalid input(s): APPERANCEUR    Imaging: No results found.   Medications:    . calcium acetate  1,334 mg Oral TID WC  . cholecalciferol  1,000 Units Oral Daily  . cinacalcet  120 mg Oral Q breakfast  . clonazePAM  0.5 mg Oral BID  . docusate sodium  100 mg Oral BID  . feeding supplement (NEPRO CARB STEADY)  237 mL Oral  Daily  . Ferrous Fumarate  1 tablet Oral Daily  . folic acid  500 mcg Oral Daily  . haloperidol  5 mg Oral Q T,Th,Sat-1800  . heparin  5,000 Units Subcutaneous Q8H  . Influenza vac split quadrivalent PF  0.5 mL Intramuscular Tomorrow-1000  . levothyroxine  600 mcg Oral QAC breakfast  . metolazone  2.5 mg Oral Daily  . multivitamin  1 tablet Oral Daily  . omega-3 acid ethyl esters  1 g Oral BID  . Oxcarbazepine  600 mg Oral BID  . pantoprazole  40 mg Oral Daily  . phenytoin  100 mg Oral Daily  . phenytoin  300 mg Oral Daily  . QUEtiapine  300 mg Oral BID  . senna-docusate  1 tablet Oral Daily   docusate sodium, HYDROmorphone  (DILAUDID) injection, LORazepam, ondansetron (ZOFRAN) IV, oxyCODONE-acetaminophen  Assessment/ Plan:  67 y.o. male with end stage renal disease on hemodialysis, hypertension, mental retardation, seizure disorder, hypothyroidism.   [facility] Right AVG EDW 91.5kg  1.  End-stage renal disease, TTS 2.  Severe hyperkalemia 3.  Hypertension 4.  Anemia of chronic kidney disease 5.  Secondary hyperparathyroidism 6.  Complication of dialysis access with thrombosed AV graft  Plan: Dialysis today to correct hyperkalemia Plan for access declot Tuesday Continue cinacalcet and calcium acetate at home doses Hemoglobin 10.9.  We will resume low-dose Procrit  Recheck K in AM    LOS: 3 Mahalie Kanner 2/4/20199:23 AM  [facility] [place], [place] [phone]

## 2017-12-01 LAB — POTASSIUM: POTASSIUM: 4.8 mmol/L (ref 3.5–5.1)

## 2017-12-01 MED ORDER — CEFAZOLIN SODIUM-DEXTROSE 2-4 GM/100ML-% IV SOLN
2.0000 g | INTRAVENOUS | Status: DC
  Filled 2017-12-01: qty 100

## 2017-12-01 NOTE — Plan of Care (Signed)
Pt was supposed to go down for a declot today however no orders were placed for pt to be npo. Dr. Letitia LibraLateff said he would talk to surgery to see if the pt could have it done tomorrow

## 2017-12-01 NOTE — Progress Notes (Signed)
[facility] [place] at [facility]   PATIENT NAME: [name] Wiley    MR#:  [id number]  DATE OF BIRTH:  10/09/1951  SUBJECTIVE:  CHIEF COMPLAINT:  No chief complaint on file. patient was not nothing by mouth today, as vascular had not placed the order for the procedures.   REVIEW OF SYSTEMS:  CONSTITUTIONAL: No fever, fatigue or weakness.  EYES: No blurred or double vision.  EARS, NOSE, AND THROAT: No tinnitus or ear pain.  RESPIRATORY: No cough, shortness of breath, wheezing or hemoptysis.  CARDIOVASCULAR: No chest pain, orthopnea, edema.  GASTROINTESTINAL: No nausea, vomiting, diarrhea or abdominal pain.  GENITOURINARY: No dysuria, hematuria.  ENDOCRINE: No polyuria, nocturia,  HEMATOLOGY: No anemia, easy bruising or bleeding SKIN: No rash or lesion. MUSCULOSKELETAL: No joint pain or arthritis.   NEUROLOGIC: No tingling, numbness, weakness.  PSYCHIATRY: No anxiety or depression.   ROS  DRUG ALLERGIES:  No Known Allergies  VITALS:  Blood pressure (!) 116/52, pulse 70, temperature 98.8 F (37.1 C), temperature source Axillary, resp. rate 14, height 5\' 7"  (1.702 m), weight 97.6 kg (215 lb 2.7 oz), SpO2 99 %.  PHYSICAL EXAMINATION:  GENERAL:  67 y.o.-year-old patient lying in the bed with no acute distress.  EYES: Pupils equal, round, reactive to light and accommodation. No scleral icterus. Extraocular muscles intact.  HEENT: Head atraumatic, normocephalic. Oropharynx and nasopharynx clear.  NECK:  Supple, no jugular venous distention. No thyroid enlargement, no tenderness.  LUNGS: Normal breath sounds bilaterally, no wheezing, rales,rhonchi or crepitation. No use of accessory muscles of respiration.  CARDIOVASCULAR: S1, S2 normal. No murmurs, rubs, or gallops.  ABDOMEN: Soft, nontender, nondistended. Bowel sounds present. No organomegaly or mass.  EXTREMITIES: No pedal edema, cyanosis, or clubbing. Right arm AV fistula. Right groin Temporary Dialysis  Catheter. NEUROLOGIC: Cranial nerves II through XII are intact. Muscle strength 4-5/5 in all extremities. Sensation intact. Gait not checked. Muffled speech, appears baseline. PSYCHIATRIC: The patient is alert and oriented x 3.  SKIN: No obvious rash, lesion, or ulcer.   Physical Exam LABORATORY PANEL:   CBC Recent Labs  Lab 11/30/17 0322  WBC 2.8*  HGB 10.9*  HCT 33.4*  PLT 113*   ------------------------------------------------------------------------------------------------------------------  Chemistries  Recent Labs  Lab 11/30/17 0322 12/01/17 0653  NA 133*  --   K 6.6* 4.8  CL 92*  --   CO2 24  --   GLUCOSE 91  --   BUN 107*  --   CREATININE 10.90*  --   CALCIUM 8.7*  --    ------------------------------------------------------------------------------------------------------------------  Cardiac Enzymes No results for input(s): TROPONINI in the last 168 hours. ------------------------------------------------------------------------------------------------------------------  RADIOLOGY:  No results found.  ASSESSMENT AND PLAN:   Principal Problem:   Hyperkalemia  * Hyperkalemia   Vascular had place temp cath and Nephro had done hemodialysis.   Again HD today  * Malfunctioning AV fistula   Declotting procedure was supposed to be done, but there was no order placed by nothing by mouth by surgeon so procedure was canceled today.  * ESRD on HD   Nephro to do HD.  * Hypothyroidism   Cont home meds.  * seizures   Cont phenytoin.   All the records are reviewed and case discussed with Care Management/Social Workerr. Management plans discussed with the patient, family and they are in agreement.  CODE STATUS: full.  TOTAL TIME TAKING CARE OF THIS PATIENT: 35 minutes.    POSSIBLE D/C IN 1-2 DAYS, DEPENDING ON CLINICAL CONDITION.  Altamese Dilling M.D on 12/01/2017   Between 7am to 6pm - Pager - [phone]  After 6pm go to [email or web address] -  password [email or web address]  [facility] [place] [facility]  Office  [phone]  CC: Primary care physician; Clinic, General Medical  Note: This dictation was prepared with Dragon dictation along with smaller phrase technology. Any transcriptional errors that result from this process are unintentional.

## 2017-12-01 NOTE — Progress Notes (Signed)
[facility], [place] 12/01/17  Subjective:   Potassium level is normal this morning Patient feels well and does not have any acute complaints No shortness of breath Nursing reports that he ate breakfast this morning  Objective:  Vital signs in last 24 hours:  Temp:  [98.1 F (36.7 C)-98.6 F (37 C)] 98.6 F (37 C) (02/04 2214) Pulse Rate:  [70-91] 72 (02/04 2214) Resp:  [13-29] 18 (02/04 2214) BP: (104-156)/(56-116) 118/83 (02/04 2214) SpO2:  [99 %-100 %] 99 % (02/04 2214)  Weight change:  Filed Weights   11/27/17 1341 11/27/17 2115 11/30/17 0945  Weight: 98 kg (216 lb) 95.3 kg (210 lb 1.6 oz) 97.6 kg (215 lb 2.7 oz)    Intake/Output:    Intake/Output Summary (Last 24 hours) at 12/01/2017 1008 Last data filed at 12/01/2017 0955 Gross per 24 hour  Intake 240 ml  Output 0 ml  Net 240 ml     Physical Exam: General:  No acute distress, laying in the bed  HEENT  anicteric  Neck  supple  Pulm/lungs  normal breathing effort, clear  CVS/Heart  regular rhythm  Abdomen:   Soft, nontender  Extremities: + Edema  Neurologic:  Alert, able to answer questions  Skin:  Dry skin  Access:  Clotted AV graft, right femoral catheter       Basic Metabolic Panel:  Recent Labs  Lab 11/27/17 2034 11/28/17 0526 11/30/17 0322 12/01/17 0653  NA 135 137 133*  --   K 4.2 5.3* 6.6* 4.8  CL 93* 96* 92*  --   CO2 26 27 24   --   GLUCOSE 70 85 91  --   BUN 59* 72* 107*  --   CREATININE 6.78* 8.37* 10.90*  --   CALCIUM 8.9 8.5* 8.7*  --      CBC: Recent Labs  Lab 11/28/17 0526 11/30/17 0322  WBC 2.5* 2.8*  HGB 11.5* 10.9*  HCT 35.5* 33.4*  MCV 104.0* 105.0*  PLT 118* 113*      Lab Results  Component Value Date   HEPBSAG Negative 11/27/2017      Microbiology:  Recent Results (from the past 240 hour(s))  MRSA PCR Screening     Status: None   Collection Time: 11/27/17  4:52 PM  Result Value Ref Range Status   MRSA by PCR NEGATIVE  NEGATIVE Final    Comment:        The GeneXpert MRSA Assay (FDA approved for NASAL specimens only), is one component of a comprehensive MRSA colonization surveillance program. It is not intended to diagnose MRSA infection nor to guide or monitor treatment for MRSA infections. Performed at [facility], [address] [place], [place] [address]     Coagulation Studies: No results for input(s): LABPROT, INR in the last 72 hours.  Urinalysis: No results for input(s): COLORURINE, LABSPEC, PHURINE, GLUCOSEU, HGBUR, BILIRUBINUR, KETONESUR, PROTEINUR, UROBILINOGEN, NITRITE, LEUKOCYTESUR in the last 72 hours.  Invalid input(s): APPERANCEUR    Imaging: No results found.   Medications:    . calcium acetate  1,334 mg Oral TID WC  . cholecalciferol  1,000 Units Oral Daily  . cinacalcet  120 mg Oral Q breakfast  . clonazePAM  0.5 mg Oral BID  . docusate sodium  100 mg Oral BID  . epoetin (EPOGEN/PROCRIT) injection  4,000 Units Intravenous Q T,Th,Sa-HD  . feeding supplement (NEPRO CARB STEADY)  237 mL Oral Daily  . Ferrous Fumarate  1 tablet Oral Daily  .  folic acid  500 mcg Oral Daily  . haloperidol  5 mg Oral Q T,Th,Sat-1800  . heparin  5,000 Units Subcutaneous Q8H  . Influenza vac split quadrivalent PF  0.5 mL Intramuscular Tomorrow-1000  . levothyroxine  600 mcg Oral QAC breakfast  . metolazone  2.5 mg Oral Daily  . multivitamin  1 tablet Oral Daily  . omega-3 acid ethyl esters  1 g Oral BID  . Oxcarbazepine  600 mg Oral BID  . pantoprazole  40 mg Oral Daily  . phenytoin  100 mg Oral Daily  . phenytoin  300 mg Oral Daily  . QUEtiapine  300 mg Oral BID  . senna-docusate  1 tablet Oral Daily   docusate sodium, HYDROmorphone (DILAUDID) injection, LORazepam, ondansetron (ZOFRAN) IV, oxyCODONE-acetaminophen  Assessment/ Plan:  67 y.o. male with end stage renal disease on hemodialysis, hypertension, mental retardation, seizure disorder, hypothyroidism.   [facility] Right AVG EDW 91.5kg  1.  End-stage renal disease, TTS 2.  Severe hyperkalemia 3.  Hypertension 4.  Anemia of chronic kidney disease 5.  Secondary hyperparathyroidism 6.  Complication of dialysis access with thrombosed AV graft  Plan: Potassium level is now corrected.  Plan for access declot as per vascular surgery continue cinacalcet and calcium acetate at home doses Hemoglobin 10.9.  We will resume low-dose Procrit     LOS: 4 [name] Wiley Thedore MinsSingh 2/5/201910:08 AM  [facility] [place], [place] [phone]

## 2017-12-02 ENCOUNTER — Encounter
Admission: RE | Disposition: A | Discharge status: Skilled Nursing Facility | Payer: Self-pay | Source: Ambulatory Visit | Attending: Internal Medicine

## 2017-12-02 DIAGNOSIS — T82868A Thrombosis of vascular prosthetic devices, implants and grafts, initial encounter: Secondary | ICD-10-CM

## 2017-12-02 DIAGNOSIS — N186 End stage renal disease: Secondary | ICD-10-CM

## 2017-12-02 HISTORY — PX: A/V SHUNT INTERVENTION: CATH118220

## 2017-12-02 SURGERY — A/V SHUNT INTERVENTION
Anesthesia: Moderate Sedation

## 2017-12-02 MED ORDER — IOPAMIDOL (ISOVUE-300) INJECTION 61%
INTRAVENOUS | Status: DC | PRN
  Administered 2017-12-02: 45 mL via INTRA_ARTERIAL

## 2017-12-02 MED ORDER — FENTANYL CITRATE (PF) 100 MCG/2ML IJ SOLN
INTRAMUSCULAR | Status: DC | PRN
  Administered 2017-12-02 (×2): 25 ug via INTRAVENOUS
  Administered 2017-12-02: 50 ug via INTRAVENOUS

## 2017-12-02 MED ORDER — FENTANYL CITRATE (PF) 100 MCG/2ML IJ SOLN
INTRAMUSCULAR | Status: AC
  Filled 2017-12-02: qty 2

## 2017-12-02 MED ORDER — DEXTROSE 5 % IV SOLN
INTRAVENOUS | Status: AC
  Administered 2017-12-02: 15:00:00
  Filled 2017-12-02: qty 1.5

## 2017-12-02 MED ORDER — HEPARIN SODIUM (PORCINE) 1000 UNIT/ML IJ SOLN
INTRAMUSCULAR | Status: DC | PRN
  Administered 2017-12-02: 4000 [IU] via INTRAVENOUS

## 2017-12-02 MED ORDER — LIDOCAINE HCL (PF) 1 % IJ SOLN
INTRAMUSCULAR | Status: AC
  Filled 2017-12-02: qty 30

## 2017-12-02 MED ORDER — HEPARIN (PORCINE) IN NACL 2-0.9 UNIT/ML-% IJ SOLN
INTRAMUSCULAR | Status: AC
  Filled 2017-12-02: qty 1000

## 2017-12-02 MED ORDER — HEPARIN SODIUM (PORCINE) 1000 UNIT/ML IJ SOLN
INTRAMUSCULAR | Status: AC
  Filled 2017-12-02: qty 1

## 2017-12-02 MED ORDER — MIDAZOLAM HCL 5 MG/5ML IJ SOLN
INTRAMUSCULAR | Status: AC
  Filled 2017-12-02: qty 5

## 2017-12-02 MED ORDER — MIDAZOLAM HCL 2 MG/2ML IJ SOLN
INTRAMUSCULAR | Status: DC | PRN
  Administered 2017-12-02 (×4): 1 mg via INTRAVENOUS

## 2017-12-02 SURGICAL SUPPLY — 19 items
BALLN DORADO 10X40X80 (BALLOONS) ×3
BALLN DORADO 8X40X80 (BALLOONS) ×3
BALLN LUTONIX AV 12X40X75 (BALLOONS) ×3
BALLOON DORADO 10X40X80 (BALLOONS) ×1 IMPLANT
BALLOON DORADO 8X40X80 (BALLOONS) ×1 IMPLANT
BALLOON LUTONIX AV 12X40X75 (BALLOONS) ×1 IMPLANT
CATH BEACON 5 .035 65 KMP TIP (CATHETERS) ×3 IMPLANT
CATH EMBOLECTOMY 5FR (BALLOONS) ×3 IMPLANT
DEVICE PRESTO INFLATION (MISCELLANEOUS) ×6 IMPLANT
GUIDEWIRE ANGLED .035 180CM (WIRE) ×3 IMPLANT
KIT THROMB PERC PTD (MISCELLANEOUS) ×3 IMPLANT
NEEDLE ENTRY 21GA 7CM ECHOTIP (NEEDLE) ×3 IMPLANT
PACK ANGIOGRAPHY (CUSTOM PROCEDURE TRAY) ×3 IMPLANT
SET INTRO CAPELLA COAXIAL (SET/KITS/TRAYS/PACK) ×3 IMPLANT
SHEATH BRITE TIP 6FRX5.5 (SHEATH) ×6 IMPLANT
SHEATH BRITE TIP 7FRX11 (SHEATH) ×3 IMPLANT
STENT COVERA FLARED 8X40X80 (Permanent Stent) ×3 IMPLANT
SUT MNCRL AB 4-0 PS2 18 (SUTURE) ×3 IMPLANT
WIRE MAGIC TORQUE 260C (WIRE) ×3 IMPLANT

## 2017-12-02 NOTE — Discharge Summary (Signed)
[facility] [place] at [facility]   PATIENT NAME: [name] Wiley    MR#:  [id number]  DATE OF BIRTH:  [birth year]  DATE OF ADMISSION:  11/27/2017 ADMITTING PHYSICIAN: Altamese Dilling, MD  DATE OF DISCHARGE: 12/02/2017  PRIMARY CARE PHYSICIAN: Clinic, General Medical    ADMISSION DIAGNOSIS:  Hyperkalemia [E87.5]  DISCHARGE DIAGNOSIS:  Principal Problem:   Hyperkalemia   SECONDARY DIAGNOSIS:   Past Medical History:  Diagnosis Date  . Anxiety   . CAD (coronary artery disease)   . Dialysis patient (HCC)   . GERD (gastroesophageal reflux disease)   . Hypertension   . Hypothyroid   . Intellectual disability   . Renal disorder   . Seizures [facility])     HOSPITAL COURSE:   * Hyperkalemia Vascular had place temp cath and Nephro had done hemodialysis.  * Malfunctioning AV fistula   Declotting procedure is done by vascular.  * ESRD on HD Nephro to do HD.  * Hypothyroidism Cont home meds.  * seizures Cont phenytoin.   DISCHARGE CONDITIONS:  Stable.   CONSULTS OBTAINED:  Treatment Team:  Renford Dills, MD  DRUG ALLERGIES:  No Known Allergies  DISCHARGE MEDICATIONS:   Allergies as of 12/02/2017   No Known Allergies     Medication List    TAKE these medications   aspirin 81 MG chewable tablet Chew 81 mg by mouth every evening.   benztropine 1 MG tablet Commonly known as:  COGENTIN Take 1 mg by mouth 2 (two) times daily.   calcium acetate 667 MG capsule Commonly known as:  PHOSLO Take 1,334 mg by mouth 3 (three) times daily with meals.   cholecalciferol 1000 units tablet Commonly known as:  VITAMIN D Take 1,000 Units by mouth daily.   cinacalcet 60 MG tablet Commonly known as:  SENSIPAR Take 120 mg by mouth daily. Take with lunch.   clonazePAM 0.5 MG tablet Commonly known as:  KLONOPIN Take 1 tablet (0.5 mg total) by mouth 2 (two) times daily. *hold for sedation*   docusate sodium 100 MG  capsule Commonly known as:  COLACE Take 1 capsule (100 mg total) by mouth 2 (two) times daily.   Ferrous Fumarate 324 (106 Fe) MG Tabs tablet Commonly known as:  HEMOCYTE - 106 mg FE Take 1 tablet by mouth daily.   Fish Oil 1000 MG Caps Take 1,000 mg by mouth 2 (two) times daily.   folic acid 400 MCG tablet Commonly known as:  FOLVITE Take 400 mcg by mouth daily.   haloperidol 5 MG tablet Commonly known as:  HALDOL Take 1 tablet (5 mg total) by mouth 3 (three) times a week. Pt takes on Tuesday, Thursday, and Saturday. What changed:  additional instructions   levothyroxine 300 MCG tablet Commonly known as:  SYNTHROID, LEVOTHROID Take 600 mcg by mouth daily before breakfast.   metolazone 2.5 MG tablet Commonly known as:  ZAROXOLYN Take 2.5 mg by mouth daily.   multivitamin Tabs tablet Take 1 tablet by mouth daily.   NEPRO Liqd Take 240 mLs by mouth daily.   feeding supplement (NEPRO CARB STEADY) Liqd Take 237 mLs by mouth daily.   omeprazole 20 MG tablet Commonly known as:  PRILOSEC OTC Take 20 mg by mouth daily.   oxcarbazepine 600 MG tablet Commonly known as:  TRILEPTAL Take 600 mg by mouth 2 (two) times daily.   oxyCODONE-acetaminophen 5-325 MG tablet Commonly known as:  PERCOCET/ROXICET Take 1 tablet by mouth every 6 (six) hours  as needed for moderate pain or severe pain.   phenytoin 100 MG ER capsule Commonly known as:  DILANTIN Take 100-300 mg by mouth See admin instructions. Take 1 capsule by mouth at 6 am, and take 3 capsules (300 mg) by mouth every night at bedtime at 8 pm.   QUEtiapine 300 MG 24 hr tablet Commonly known as:  SEROQUEL XR Take 300 mg by mouth 2 (two) times daily. At 0600 and 1700.   senna-docusate 8.6-50 MG tablet Commonly known as:  Senokot-S Take 1 tablet by mouth daily.   urea 40 % Crea Commonly known as:  CARMOL Apply 1 application topically daily.        DISCHARGE INSTRUCTIONS:    Follow with nephro as needed.  If  you experience worsening of your admission symptoms, develop shortness of breath, life threatening emergency, suicidal or homicidal thoughts you must seek medical attention immediately by calling 911 or calling your MD immediately  if symptoms less severe.  You Must read complete instructions/literature along with all the possible adverse reactions/side effects for all the Medicines you take and that have been prescribed to you. Take any new Medicines after you have completely understood and accept all the possible adverse reactions/side effects.   Please note  You were cared for by a hospitalist during your hospital stay. If you have any questions about your discharge medications or the care you received while you were in the hospital after you are discharged, you can call the unit and asked to speak with the hospitalist on call if the hospitalist that took care of you is not available. Once you are discharged, your primary care physician will handle any further medical issues. Please note that NO REFILLS for any discharge medications will be authorized once you are discharged, as it is imperative that you return to your primary care physician (or establish a relationship with a primary care physician if you do not have one) for your aftercare needs so that they can reassess your need for medications and monitor your lab values.    Today   CHIEF COMPLAINT:  No chief complaint on file.   HISTORY OF PRESENT ILLNESS:  [name] Wiley  is a 67 y.o. male with a known history of Anxiety, CAD, ESRD on HD, Htn, Seizures- Brought to vascular procedures for declotting the fistula. Came for the procedure- noted to have high potassium, so Dr. Edrick KinsSchiner suggest to have temp catheter and do HD and admit today. Pt have no complains.   VITAL SIGNS:  Blood pressure 121/73, pulse 70, temperature 98.3 F (36.8 C), temperature source Oral, resp. rate 18, height 5\' 7"  (1.702 m), weight 97.6 kg (215 lb 2.7 oz), SpO2  100 %.  I/O:    Intake/Output Summary (Last 24 hours) at 12/02/2017 1157 Last data filed at 12/01/2017 2100 Gross per 24 hour  Intake 340 ml  Output -  Net 340 ml    PHYSICAL EXAMINATION:   GENERAL:  67 y.o.-year-old patient lying in the bed with no acute distress.  EYES: Pupils equal, round, reactive to light and accommodation. No scleral icterus. Extraocular muscles intact.  HEENT: Head atraumatic, normocephalic. Oropharynx and nasopharynx clear.  NECK:  Supple, no jugular venous distention. No thyroid enlargement, no tenderness.  LUNGS: Normal breath sounds bilaterally, no wheezing, rales,rhonchi or crepitation. No use of accessory muscles of respiration.  CARDIOVASCULAR: S1, S2 normal. No murmurs, rubs, or gallops.  ABDOMEN: Soft, nontender, nondistended. Bowel sounds present. No organomegaly or mass.  EXTREMITIES: No  pedal edema, cyanosis, or clubbing. Right arm AV fistula. Right groin Temporary Dialysis Catheter. NEUROLOGIC: Cranial nerves II through XII are intact. Muscle strength 4-5/5 in all extremities. Sensation intact. Gait not checked. Muffled speech, appears baseline. PSYCHIATRIC: The patient is alert and oriented x 3.  SKIN: No obvious rash, lesion, or ulcer.     DATA REVIEW:   CBC Recent Labs  Lab 11/30/17 0322  WBC 2.8*  HGB 10.9*  HCT 33.4*  PLT 113*    Chemistries  Recent Labs  Lab 11/30/17 0322 12/01/17 0653  NA 133*  --   K 6.6* 4.8  CL 92*  --   CO2 24  --   GLUCOSE 91  --   BUN 107*  --   CREATININE 10.90*  --   CALCIUM 8.7*  --     Cardiac Enzymes No results for input(s): TROPONINI in the last 168 hours.  Microbiology Results  Results for orders placed or performed during the hospital encounter of 11/27/17  MRSA PCR Screening     Status: None   Collection Time: 11/27/17  4:52 PM  Result Value Ref Range Status   MRSA by PCR NEGATIVE NEGATIVE Final    Comment:        The GeneXpert MRSA Assay (FDA approved for NASAL specimens only),  is one component of a comprehensive MRSA colonization surveillance program. It is not intended to diagnose MRSA infection nor to guide or monitor treatment for MRSA infections. Performed at [facility], [address] [place], [place] [address]     RADIOLOGY:  No results found.  EKG:   Orders placed or performed during the hospital encounter of 09/23/17  . ED EKG  . ED EKG      Management plans discussed with the patient, family and they are in agreement.  CODE STATUS:     Code Status Orders  (From admission, onward)        Start     Ordered   11/27/17 1630  Full code  Continuous     11/27/17 1629    Code Status History    Date Active Date Inactive Code Status Order ID Comments User Context   09/24/2017 00:34 09/25/2017 12:54 Full Code [id number]  Houston Siren, MD Inpatient   04/26/2016 14:47 05/01/2016 21:33 Full Code [id number]  Katharina Caper, MD Inpatient   03/03/2015 18:43 03/06/2015 19:33 Full Code [id number]  Enid Baas, MD Inpatient    Advance Directive Documentation     Most Recent Value  Type of Advance Directive  Healthcare Power of Attorney  Pre-existing out of facility DNR order (yellow form or pink MOST form)  No data  "MOST" Form in Place?  No data      TOTAL TIME TAKING CARE OF THIS PATIENT: 35 minutes.    Altamese Dilling M.D on 12/02/2017 at 11:57 AM  Between 7am to 6pm - Pager - [phone]  After 6pm go to [email or web address] - password [email or web address]  [facility] [place] [facility]  Office  [phone]  CC: Primary care physician; Clinic, General Medical   Note: This dictation was prepared with Dragon dictation along with smaller phrase technology. Any transcriptional errors that result from this process are unintentional.

## 2017-12-02 NOTE — Progress Notes (Signed)
   12/02/17 1200  Clinical Encounter Type  Visited With Patient  Visit Type Initial;Spiritual support  Referral From Nurse;Patient  Spiritual Encounters  Spiritual Needs Prayer;Emotional  CH responded to patient request for prayer; patient very spiritual with many resources to support him; CH offered spiritual and emotional support along with prayer; patient is difficult to understand but able to articulate spiritual request.

## 2017-12-02 NOTE — Progress Notes (Signed)
Order received from Dr Thedore MinsSingh to remove trialysis line

## 2017-12-02 NOTE — Progress Notes (Signed)
Report called to [place] at [facility]. EMS being notified for transportation

## 2017-12-02 NOTE — Progress Notes (Signed)
[facility] [place], [place] 12/02/17  Subjective:   No acute complaints Potassium was 4.8 yesterday morning Denies any shortness of breath  Objective:  Vital signs in last 24 hours:  Temp:  [98 F (36.7 C)-98.8 F (37.1 C)] 98.3 F (36.8 C) (02/06 0900) Pulse Rate:  [68-70] 70 (02/06 0900) Resp:  [14-20] 18 (02/06 0900) BP: (107-121)/(52-73) 121/73 (02/06 0900) SpO2:  [98 %-100 %] 100 % (02/06 0900)  Weight change:  Filed Weights   11/27/17 1341 11/27/17 2115 11/30/17 0945  Weight: 98 kg (216 lb) 95.3 kg (210 lb 1.6 oz) 97.6 kg (215 lb 2.7 oz)    Intake/Output:    Intake/Output Summary (Last 24 hours) at 12/02/2017 1016 Last data filed at 12/01/2017 2100 Gross per 24 hour  Intake 340 ml  Output -  Net 340 ml     Physical Exam: General:  No acute distress, laying in the bed  HEENT  anicteric  Neck  supple  Pulm/lungs  normal breathing effort, clear  CVS/Heart  regular rhythm  Abdomen:   Soft, nontender  Extremities: + Edema  Neurologic:  Alert, able to answer questions  Skin:  Dry skin  Access:  Clotted AV graft, right femoral catheter       Basic Metabolic Panel:  Recent Labs  Lab 11/27/17 2034 11/28/17 0526 11/30/17 0322 12/01/17 0653  NA 135 137 133*  --   K 4.2 5.3* 6.6* 4.8  CL 93* 96* 92*  --   CO2 26 27 24   --   GLUCOSE 70 85 91  --   BUN 59* 72* 107*  --   CREATININE 6.78* 8.37* 10.90*  --   CALCIUM 8.9 8.5* 8.7*  --      CBC: Recent Labs  Lab 11/28/17 0526 11/30/17 0322  WBC 2.5* 2.8*  HGB 11.5* 10.9*  HCT 35.5* 33.4*  MCV 104.0* 105.0*  PLT 118* 113*      Lab Results  Component Value Date   HEPBSAG Negative 11/27/2017      Microbiology:  Recent Results (from the past 240 hour(s))  MRSA PCR Screening     Status: None   Collection Time: 11/27/17  4:52 PM  Result Value Ref Range Status   MRSA by PCR NEGATIVE NEGATIVE Final    Comment:        The GeneXpert MRSA Assay (FDA approved for NASAL  specimens only), is one component of a comprehensive MRSA colonization surveillance program. It is not intended to diagnose MRSA infection nor to guide or monitor treatment for MRSA infections. Performed at [facility], [address] [place], [place] [address]     Coagulation Studies: No results for input(s): LABPROT, INR in the last 72 hours.  Urinalysis: No results for input(s): COLORURINE, LABSPEC, PHURINE, GLUCOSEU, HGBUR, BILIRUBINUR, KETONESUR, PROTEINUR, UROBILINOGEN, NITRITE, LEUKOCYTESUR in the last 72 hours.  Invalid input(s): APPERANCEUR    Imaging: No results found.   Medications:   .  ceFAZolin (ANCEF) IV     . calcium acetate  1,334 mg Oral TID WC  . cholecalciferol  1,000 Units Oral Daily  . cinacalcet  120 mg Oral Q breakfast  . clonazePAM  0.5 mg Oral BID  . docusate sodium  100 mg Oral BID  . epoetin (EPOGEN/PROCRIT) injection  4,000 Units Intravenous Q T,Th,Sa-HD  . feeding supplement (NEPRO CARB STEADY)  237 mL Oral Daily  . Ferrous Fumarate  1 tablet Oral Daily  . folic acid  500 mcg Oral Daily  .  haloperidol  5 mg Oral Q T,Th,Sat-1800  . heparin  5,000 Units Subcutaneous Q8H  . Influenza vac split quadrivalent PF  0.5 mL Intramuscular Tomorrow-1000  . levothyroxine  600 mcg Oral QAC breakfast  . metolazone  2.5 mg Oral Daily  . multivitamin  1 tablet Oral Daily  . omega-3 acid ethyl esters  1 g Oral BID  . Oxcarbazepine  600 mg Oral BID  . pantoprazole  40 mg Oral Daily  . phenytoin  100 mg Oral Daily  . phenytoin  300 mg Oral Daily  . QUEtiapine  300 mg Oral BID  . senna-docusate  1 tablet Oral Daily   docusate sodium, HYDROmorphone (DILAUDID) injection, LORazepam, ondansetron (ZOFRAN) IV, oxyCODONE-acetaminophen  Assessment/ Plan:  67 y.o. male with end stage renal disease on hemodialysis, hypertension, mental retardation, seizure disorder, hypothyroidism.   [facility] TTS [employer]. Right AVG EDW 91.5kg  1.  End-stage  renal disease, TTS 2.  Severe hyperkalemia 3.  Hypertension 4.  Anemia of chronic kidney disease 5.  Secondary hyperparathyroidism 6.  Complication of dialysis access with thrombosed AV graft  Plan: Potassium level is now corrected.  Plan for access declot as per vascular surgery today continue cinacalcet and calcium acetate at home doses with meals Hemoglobin 10.9.  We will resume low-dose Procrit  Patient is expected to be discharged today.  He will resume outpatient hemodialysis starting tomorrow    LOS: 5 Traye Bates Thedore Mins 2/6/201910:16 AM  [facility] [place], [place] [phone]

## 2017-12-02 NOTE — Progress Notes (Signed)
EMS here to transport the patient.  Nathanael, patients brother, notified of the discharge

## 2017-12-02 NOTE — Clinical Social Work Note (Signed)
Patient to have declot and then will be ready for discharge to return to [facility] today. Patient's brother, Gardiner Barefootathaniel, was made aware of this plan yesterday. York SpanielMonica Elleigh Cassetta MSW,LcSW [phone]

## 2017-12-02 NOTE — Progress Notes (Signed)
Patient transferred to special recovery for procedure

## 2017-12-02 NOTE — Op Note (Signed)
OPERATIVE NOTE   PROCEDURE: 1. Contrast injection right brachial axillary AV access 2. Percutaneous transluminal angioplasty and stent placement with a 8 mm x 40 mm flared Covera stent peripheral segment  3. Percutaneous transluminal angioplasty central venous segment to 12 mm with a Lutonix drug-eluting balloon 4. Mechanical thrombectomy right brachial axillary dialysis graft with a Trerotola device  PRE-OPERATIVE DIAGNOSIS: Complication of dialysis access                                                       End Stage Renal Disease  POST-OPERATIVE DIAGNOSIS: same as above   SURGEON: Katha Cabal, M.D.  ANESTHESIA: Conscious sedation was administered under my direct supervision by the interventional radiology RN. IV Versed plus fentanyl were utilized. Continuous ECG, pulse oximetry and blood pressure was monitored throughout the entire procedure.  Conscious sedation was for a total of 48.  ESTIMATED BLOOD LOSS: minimal  FINDING(S): Stricture of the AV graft within the peripheral segment as well as a stricture within the central venous portion  SPECIMEN(S):  None  CONTRAST: 45 cc  FLUOROSCOPY TIME: 11.3 minutes  INDICATIONS: [name] Wiley is a 67 y.o. male who  presents with thrombosed right arm AV access.  The patient is scheduled for angiography with possible intervention of the AV access.  The patient is aware the risks include but are not limited to: bleeding, infection, thrombosis of the cannulated access, and possible anaphylactic reaction to the contrast.  The patient acknowledges if the access can not be salvaged a tunneled catheter will be needed and will be placed during this procedure.  The patient is aware of the risks of the procedure and elects to proceed with the angiogram and intervention.  DESCRIPTION: After full informed written consent was obtained, the patient was brought back to the Special Procedure suite and placed supine position.  Appropriate  cardiopulmonary monitors were placed.  The right arm was prepped and draped in the standard fashion.  Appropriate timeout is called. The right brachial axillary AV graft was cannulated with a micropuncture needle.  Cannulation was performed with ultrasound guidance. Ultrasound was placed in a sterile sleeve, the AV access was interrogated and noted to be echolucent and compressible indicating patency. Image was recorded for the permanent record. The puncture is performed under continuous ultrasound visualization.   The microwire was advanced and the needle was exchanged for  a microsheath.  The J-wire was then advanced and a 6 Fr sheath inserted.  Hand injections were completed to image the access from the sheath entry site through the entire access.  The central venous structures were also imaged by hand injections.  This was performed using a floppy Glidewire to negotiate a Kumpe into the central venous system.  Based on the images,  3000 units of heparin was given and a Trerotola device was delivered onto the field mechanical thrombectomy was performed from the end of the subclavian stent all the way to the sheath several passes were made.  Follow-up imaging by hand-injection demonstrated the vast majority of the thrombus had been removed.  And the proximal graft and central venous system was flushed with heparinized saline.  Ultrasound was returned to the field the graft was imaged at the level of the deltoid muscle.  1% lidocaine was infiltrated image was recorded for the permanent record.  The graft was now echolucent indicating patency.  Micropuncture needle was then inserted in a retrograde direction followed by the microwire and then the micro-sheath.  J-wire followed by a 6 French sheath was inserted.  Using the Kumpe catheter and a floppy Glidewire the arterial portion of the graft was negotiated and the catheter advanced into the brachial artery.  Hand-injection contrast demonstrated patency of the  brachial artery as well as the location of the arterial anastomosis.  A Trerotola device was then advanced into the brachial artery the basket was opened it was then withdrawn into the graft and then engaged.  2 passes were made.  However, residual thrombus was noted the Glidewire was reintroduced and an over-the-wire Fogarty was used to perform thrombectomy from the arterial anastomosis up to the initial sheath.  Residual thrombus in the venous portion was then again treated easily through the initial sheath with the Trerotola.  Forward flow was now reestablished and confirmed by hand-injection.  The Magic torque wire was negotiated through the strictures within the venous portion of the graft as well as the central stenosis.  Tip of the Magic torque wire was positioned in the right atrium.  The sheath was then upsized to a 7 [place] sheath.  A 8 mm flared Covera stent was then deployed through the antegrade sheath covering the leading edge of the previous stent in the narrowing that was located at this location.  An 8 mm x 40 mm Dorado balloon was used.  Inflation was to 38 atm for 1 minute.  The detector was then repositioned over the central portion of the AV access and 10 mm x 40 mm Dorado balloon was used to treat the stricture within the central portions inflation was to 26 atm for 1 minutes.  Subsequently the most proximal edge of the stent which is located essentially at the confluence of the subclavian with the jugular was dilated a second time with a 12 mm x 40 mm Lutonix drug-eluting balloon.  Inflation was to 12 atm for 1 minute.  Follow-up imaging demonstrates significant improvement with a marked less than 10% residual stenosis at the site of the strictures.  There is now rapid flow of contrast through the graft and the central veins.   A 4-0 Monocryl purse-string suture was sewn around both of the sheaths.  The sheaths was removed and light pressure was applied.  A sterile bandage was applied to  the puncture site.    COMPLICATIONS: None  CONDITION: Carlynn Purl, M.D [facility] Office: [phone]  12/02/2017 4:13 PM

## 2017-12-02 NOTE — Care Management (Signed)
Amand[facility] HD liaison notified of discharge.  

## 2017-12-02 NOTE — Care Management Important Message (Signed)
Important Message  Patient Details  Name: [name] Wiley MRN: [id number] Date of Birth: 09/04/1951   Medicare Important Message Given:  Yes    Chapman FitchBOWEN, Kayleeann Huxford T, RN 12/02/2017, 1:57 PM

## 2017-12-07 ENCOUNTER — Encounter: Payer: Self-pay | Admitting: Vascular Surgery

## 2018-08-13 ENCOUNTER — Inpatient Hospital Stay
Admission: EM | Admit: 2018-08-13 | Discharge: 2018-08-16 | DRG: 388 | Disposition: A | Discharge status: Skilled Nursing Facility | Payer: Medicare Other | Attending: Internal Medicine | Admitting: Internal Medicine

## 2018-08-13 ENCOUNTER — Emergency Department: Payer: Medicare Other

## 2018-08-13 ENCOUNTER — Inpatient Hospital Stay: Payer: Medicare Other

## 2018-08-13 ENCOUNTER — Other Ambulatory Visit: Payer: Self-pay

## 2018-08-13 ENCOUNTER — Encounter: Payer: Self-pay | Admitting: Emergency Medicine

## 2018-08-13 DIAGNOSIS — N186 End stage renal disease: Secondary | ICD-10-CM | POA: Diagnosis present

## 2018-08-13 DIAGNOSIS — Z992 Dependence on renal dialysis: Secondary | ICD-10-CM | POA: Diagnosis not present

## 2018-08-13 DIAGNOSIS — I2510 Atherosclerotic heart disease of native coronary artery without angina pectoris: Secondary | ICD-10-CM | POA: Diagnosis present

## 2018-08-13 DIAGNOSIS — Z79899 Other long term (current) drug therapy: Secondary | ICD-10-CM

## 2018-08-13 DIAGNOSIS — K56600 Partial intestinal obstruction, unspecified as to cause: Secondary | ICD-10-CM | POA: Diagnosis present

## 2018-08-13 DIAGNOSIS — E039 Hypothyroidism, unspecified: Secondary | ICD-10-CM | POA: Diagnosis present

## 2018-08-13 DIAGNOSIS — G40909 Epilepsy, unspecified, not intractable, without status epilepticus: Secondary | ICD-10-CM | POA: Diagnosis present

## 2018-08-13 DIAGNOSIS — F70 Mild intellectual disabilities: Secondary | ICD-10-CM | POA: Diagnosis present

## 2018-08-13 DIAGNOSIS — E785 Hyperlipidemia, unspecified: Secondary | ICD-10-CM | POA: Diagnosis present

## 2018-08-13 DIAGNOSIS — N2581 Secondary hyperparathyroidism of renal origin: Secondary | ICD-10-CM | POA: Diagnosis present

## 2018-08-13 DIAGNOSIS — K56609 Unspecified intestinal obstruction, unspecified as to partial versus complete obstruction: Secondary | ICD-10-CM | POA: Diagnosis present

## 2018-08-13 DIAGNOSIS — I120 Hypertensive chronic kidney disease with stage 5 chronic kidney disease or end stage renal disease: Secondary | ICD-10-CM | POA: Diagnosis present

## 2018-08-13 DIAGNOSIS — Z7982 Long term (current) use of aspirin: Secondary | ICD-10-CM

## 2018-08-13 DIAGNOSIS — K920 Hematemesis: Secondary | ICD-10-CM | POA: Diagnosis present

## 2018-08-13 DIAGNOSIS — D631 Anemia in chronic kidney disease: Secondary | ICD-10-CM | POA: Diagnosis present

## 2018-08-13 DIAGNOSIS — F419 Anxiety disorder, unspecified: Secondary | ICD-10-CM | POA: Diagnosis present

## 2018-08-13 DIAGNOSIS — F209 Schizophrenia, unspecified: Secondary | ICD-10-CM | POA: Diagnosis present

## 2018-08-13 DIAGNOSIS — K219 Gastro-esophageal reflux disease without esophagitis: Secondary | ICD-10-CM | POA: Diagnosis present

## 2018-08-13 LAB — CBC WITH DIFFERENTIAL/PLATELET
ABS IMMATURE GRANULOCYTES: 0.02 10*3/uL (ref 0.00–0.07)
BASOS PCT: 0 %
Basophils Absolute: 0 10*3/uL (ref 0.0–0.1)
EOS ABS: 0 10*3/uL (ref 0.0–0.5)
Eosinophils Relative: 0 %
HEMATOCRIT: 35.4 % — AB (ref 39.0–52.0)
Hemoglobin: 11.8 g/dL — ABNORMAL LOW (ref 13.0–17.0)
Immature Granulocytes: 0 %
LYMPHS ABS: 0.5 10*3/uL — AB (ref 0.7–4.0)
Lymphocytes Relative: 7 %
MCH: 32.3 pg (ref 26.0–34.0)
MCHC: 33.3 g/dL (ref 30.0–36.0)
MCV: 97 fL (ref 80.0–100.0)
MONO ABS: 0.8 10*3/uL (ref 0.1–1.0)
Monocytes Relative: 11 %
NEUTROS ABS: 5.5 10*3/uL (ref 1.7–7.7)
Neutrophils Relative %: 82 %
PLATELETS: 146 10*3/uL — AB (ref 150–400)
RBC: 3.65 MIL/uL — ABNORMAL LOW (ref 4.22–5.81)
RDW: 16.3 % — ABNORMAL HIGH (ref 11.5–15.5)
WBC: 6.8 10*3/uL (ref 4.0–10.5)
nRBC: 0 % (ref 0.0–0.2)

## 2018-08-13 LAB — COMPREHENSIVE METABOLIC PANEL
ALT: 12 U/L (ref 0–44)
AST: 18 U/L (ref 15–41)
Albumin: 3.7 g/dL (ref 3.5–5.0)
Alkaline Phosphatase: 136 U/L — ABNORMAL HIGH (ref 38–126)
Anion gap: 14 (ref 5–15)
BUN: 34 mg/dL — AB (ref 8–23)
CALCIUM: 9.5 mg/dL (ref 8.9–10.3)
CO2: 34 mmol/L — ABNORMAL HIGH (ref 22–32)
CREATININE: 5.78 mg/dL — AB (ref 0.61–1.24)
Chloride: 89 mmol/L — ABNORMAL LOW (ref 98–111)
GFR, EST AFRICAN AMERICAN: 10 mL/min — AB (ref >60)
GFR, EST NON AFRICAN AMERICAN: 9 mL/min — AB (ref >60)
Glucose, Bld: 129 mg/dL — ABNORMAL HIGH (ref 70–99)
Potassium: 3.5 mmol/L (ref 3.5–5.1)
SODIUM: 137 mmol/L (ref 135–145)
Total Bilirubin: 0.5 mg/dL (ref 0.3–1.2)
Total Protein: 7.6 g/dL (ref 6.5–8.1)

## 2018-08-13 LAB — LACTIC ACID, PLASMA: Lactic Acid, Venous: 1.2 mmol/L (ref 0.5–1.9)

## 2018-08-13 LAB — LIPASE, BLOOD: LIPASE: 29 U/L (ref 11–51)

## 2018-08-13 LAB — HEMOGLOBIN
Hemoglobin: 12.5 g/dL — ABNORMAL LOW (ref 13.0–17.0)
Hemoglobin: 12 g/dL — ABNORMAL LOW (ref 13.0–17.0)

## 2018-08-13 LAB — TSH: TSH: 0.077 u[IU]/mL — ABNORMAL LOW (ref 0.350–4.500)

## 2018-08-13 LAB — TROPONIN I: TROPONIN I: 0.04 ng/mL — AB (ref <0.03)

## 2018-08-13 LAB — PHENYTOIN LEVEL, TOTAL: Phenytoin Lvl: 6.2 ug/mL — ABNORMAL LOW (ref 10.0–20.0)

## 2018-08-13 MED ORDER — PANTOPRAZOLE SODIUM 40 MG IV SOLR
40.0000 mg | Freq: Two times a day (BID) | INTRAVENOUS | Status: DC
  Administered 2018-08-13 – 2018-08-16 (×6): 40 mg via INTRAVENOUS
  Filled 2018-08-13 (×7): qty 40

## 2018-08-13 MED ORDER — HYDRALAZINE HCL 20 MG/ML IJ SOLN
10.0000 mg | INTRAMUSCULAR | Status: AC
  Administered 2018-08-13: 10 mg via INTRAVENOUS
  Filled 2018-08-13: qty 1

## 2018-08-13 MED ORDER — PANTOPRAZOLE SODIUM 40 MG IV SOLR
40.0000 mg | Freq: Once | INTRAVENOUS | Status: AC
  Administered 2018-08-13: 40 mg via INTRAVENOUS
  Filled 2018-08-13: qty 40

## 2018-08-13 MED ORDER — IOPAMIDOL (ISOVUE-300) INJECTION 61%
100.0000 mL | Freq: Once | INTRAVENOUS | Status: AC | PRN
  Administered 2018-08-13: 100 mL via INTRAVENOUS

## 2018-08-13 MED ORDER — ASPIRIN 81 MG PO CHEW
81.0000 mg | CHEWABLE_TABLET | Freq: Every evening | ORAL | Status: DC
  Administered 2018-08-15: 81 mg via ORAL
  Filled 2018-08-13 (×2): qty 1

## 2018-08-13 MED ORDER — OXCARBAZEPINE 300 MG PO TABS
600.0000 mg | ORAL_TABLET | Freq: Two times a day (BID) | ORAL | Status: DC
  Administered 2018-08-13 – 2018-08-16 (×6): 600 mg via ORAL
  Filled 2018-08-13 (×8): qty 2

## 2018-08-13 MED ORDER — METOLAZONE 2.5 MG PO TABS
2.5000 mg | ORAL_TABLET | Freq: Every day | ORAL | Status: DC
  Filled 2018-08-13 (×4): qty 1

## 2018-08-13 MED ORDER — SENNOSIDES-DOCUSATE SODIUM 8.6-50 MG PO TABS
1.0000 | ORAL_TABLET | Freq: Every day | ORAL | Status: DC
  Administered 2018-08-14 – 2018-08-16 (×3): 1 via ORAL
  Filled 2018-08-13 (×3): qty 1

## 2018-08-13 MED ORDER — NEPRO/CARBSTEADY PO LIQD
237.0000 mL | Freq: Every day | ORAL | Status: DC
  Administered 2018-08-16: 237 mL via ORAL

## 2018-08-13 MED ORDER — DOCUSATE SODIUM 100 MG PO CAPS: 100.0000 mg | ORAL_CAPSULE | Freq: Two times a day (BID) | ORAL | Status: DC

## 2018-08-13 MED ORDER — ACETAMINOPHEN 650 MG RE SUPP: 650.0000 mg | Freq: Four times a day (QID) | RECTAL | Status: DC | PRN

## 2018-08-13 MED ORDER — NITROGLYCERIN 2 % TD OINT
0.5000 [in_us] | TOPICAL_OINTMENT | TRANSDERMAL | Status: AC
  Administered 2018-08-13: 0.5 [in_us] via TOPICAL
  Filled 2018-08-13: qty 1

## 2018-08-13 MED ORDER — FOLIC ACID 1 MG PO TABS
500.0000 ug | ORAL_TABLET | Freq: Every day | ORAL | Status: DC
  Administered 2018-08-14 – 2018-08-16 (×3): 0.5 mg via ORAL
  Filled 2018-08-13 (×3): qty 1

## 2018-08-13 MED ORDER — PHENYTOIN SODIUM EXTENDED 100 MG PO CAPS
300.0000 mg | ORAL_CAPSULE | Freq: Every day | ORAL | Status: DC
  Administered 2018-08-13 – 2018-08-15 (×3): 300 mg via ORAL
  Filled 2018-08-13 (×4): qty 3

## 2018-08-13 MED ORDER — OXYCODONE-ACETAMINOPHEN 5-325 MG PO TABS: 1.0000 | ORAL_TABLET | Freq: Four times a day (QID) | ORAL | Status: DC | PRN

## 2018-08-13 MED ORDER — QUETIAPINE FUMARATE ER 300 MG PO TB24
300.0000 mg | ORAL_TABLET | Freq: Three times a day (TID) | ORAL | Status: DC
  Administered 2018-08-13 – 2018-08-16 (×7): 300 mg via ORAL
  Filled 2018-08-13 (×12): qty 1

## 2018-08-13 MED ORDER — FERROUS FUMARATE 324 (106 FE) MG PO TABS
1.0000 | ORAL_TABLET | Freq: Every day | ORAL | Status: DC
  Administered 2018-08-14 – 2018-08-16 (×3): 106 mg via ORAL
  Filled 2018-08-13 (×4): qty 1

## 2018-08-13 MED ORDER — HALOPERIDOL 5 MG PO TABS
5.0000 mg | ORAL_TABLET | Freq: Every day | ORAL | Status: DC | PRN
  Filled 2018-08-13: qty 1

## 2018-08-13 MED ORDER — RENA-VITE PO TABS
1.0000 | ORAL_TABLET | Freq: Every day | ORAL | Status: DC
  Administered 2018-08-14 – 2018-08-16 (×3): 1 via ORAL
  Filled 2018-08-13 (×4): qty 1

## 2018-08-13 MED ORDER — BENZTROPINE MESYLATE 1 MG PO TABS
1.0000 mg | ORAL_TABLET | Freq: Two times a day (BID) | ORAL | Status: DC
  Administered 2018-08-13 – 2018-08-16 (×6): 1 mg via ORAL
  Filled 2018-08-13 (×8): qty 1

## 2018-08-13 MED ORDER — CALCIUM ACETATE (PHOS BINDER) 667 MG PO CAPS
1334.0000 mg | ORAL_CAPSULE | Freq: Three times a day (TID) | ORAL | Status: DC
  Filled 2018-08-13 (×2): qty 2

## 2018-08-13 MED ORDER — ONDANSETRON HCL 4 MG PO TABS: 4.0000 mg | ORAL_TABLET | Freq: Four times a day (QID) | ORAL | Status: DC | PRN

## 2018-08-13 MED ORDER — PANTOPRAZOLE SODIUM 40 MG PO TBEC: 40.0000 mg | DELAYED_RELEASE_TABLET | Freq: Every day | ORAL | Status: DC

## 2018-08-13 MED ORDER — ACETAMINOPHEN 325 MG PO TABS: 650.0000 mg | ORAL_TABLET | Freq: Four times a day (QID) | ORAL | Status: DC | PRN

## 2018-08-13 MED ORDER — NEPRO PO LIQD
237.0000 mL | Freq: Every day | ORAL | Status: DC
  Filled 2018-08-13 (×3): qty 237

## 2018-08-13 MED ORDER — PHENYTOIN SODIUM EXTENDED 100 MG PO CAPS
100.0000 mg | ORAL_CAPSULE | ORAL | Status: DC
  Administered 2018-08-14 – 2018-08-16 (×3): 100 mg via ORAL
  Filled 2018-08-13 (×4): qty 1

## 2018-08-13 MED ORDER — HALOPERIDOL LACTATE 5 MG/ML IJ SOLN
5.0000 mg | Freq: Once | INTRAMUSCULAR | Status: AC
  Administered 2018-08-13: 5 mg via INTRAVENOUS
  Filled 2018-08-13: qty 1

## 2018-08-13 MED ORDER — CHLORHEXIDINE GLUCONATE CLOTH 2 % EX PADS
6.0000 | MEDICATED_PAD | Freq: Every day | CUTANEOUS | Status: DC
  Administered 2018-08-15 – 2018-08-16 (×2): 6 via TOPICAL

## 2018-08-13 MED ORDER — LEVOTHYROXINE SODIUM 100 MCG PO TABS
300.0000 ug | ORAL_TABLET | Freq: Two times a day (BID) | ORAL | Status: DC
  Administered 2018-08-14 – 2018-08-16 (×4): 300 ug via ORAL
  Filled 2018-08-13: qty 2
  Filled 2018-08-13 (×5): qty 3

## 2018-08-13 MED ORDER — CLONAZEPAM 0.5 MG PO TABS
0.5000 mg | ORAL_TABLET | Freq: Two times a day (BID) | ORAL | Status: DC
  Administered 2018-08-13 – 2018-08-16 (×6): 0.5 mg via ORAL
  Filled 2018-08-13 (×6): qty 1

## 2018-08-13 MED ORDER — CINACALCET HCL 30 MG PO TABS
90.0000 mg | ORAL_TABLET | ORAL | Status: DC
  Filled 2018-08-13: qty 3

## 2018-08-13 MED ORDER — HALOPERIDOL 5 MG PO TABS
5.0000 mg | ORAL_TABLET | ORAL | Status: DC
  Administered 2018-08-16: 5 mg via ORAL
  Filled 2018-08-13 (×2): qty 1

## 2018-08-13 MED ORDER — ONDANSETRON HCL 4 MG/2ML IJ SOLN: 4.0000 mg | Freq: Four times a day (QID) | INTRAMUSCULAR | Status: DC | PRN

## 2018-08-13 MED ORDER — PHENYTOIN SODIUM EXTENDED 100 MG PO CAPS: 100.0000 mg | ORAL_CAPSULE | ORAL | Status: DC

## 2018-08-13 MED ORDER — VITAMIN D 1000 UNITS PO TABS
1000.0000 [IU] | ORAL_TABLET | Freq: Every day | ORAL | Status: DC
  Administered 2018-08-14 – 2018-08-16 (×3): 1000 [IU] via ORAL
  Filled 2018-08-13 (×5): qty 1

## 2018-08-13 MED ORDER — GUAIFENESIN-DM 100-10 MG/5ML PO SYRP: 10.0000 mL | ORAL_SOLUTION | ORAL | Status: DC | PRN

## 2018-08-13 MED ORDER — DOCUSATE SODIUM 100 MG PO CAPS
100.0000 mg | ORAL_CAPSULE | Freq: Two times a day (BID) | ORAL | Status: DC
  Administered 2018-08-13 – 2018-08-16 (×6): 100 mg via ORAL
  Filled 2018-08-13 (×6): qty 1

## 2018-08-13 NOTE — Clinical Social Work Note (Signed)
Clinical Social Work Assessment  Patient Details  Name: [name] Wiley MRN: [id number] Date of Birth: 1951-02-21  Date of referral:  08/13/18               Reason for consult:  Other (Comment Required)(From [facility] )                Permission sought to share information with:  Chartered certified accountant granted to share information::  Yes, Verbal Permission Granted  Name::      [facility]   Agency::   [facility]   Relationship::     Contact Information:     Housing/Transportation Living arrangements for the past 2 months:  [place] of Information:  Patient, Siblings Patient Interpreter Needed:  None Criminal Activity/Legal Involvement Pertinent to Current Situation/Hospitalization:  No - Comment as needed Significant Relationships:  Siblings Lives with:  Facility Resident Do you feel safe going back to the place where you live?  Yes Need for family participation in patient care:  Yes (Comment)  Care giving concerns:  Patient is a long term care SNF resident at [facility].    Social Worker assessment / plan:  Holiday representative (CSW) reviewed chart and noted that patient is from [facility]. Per Otila Kluver [facility] liaison patient is a long term care resident and can return when stable. CSW met with patient alone at bedside. Patient was alert and oriented X2 and was laying in the bed. CSW introduced self and explained role of CSW department. Per patient he lives at [facility] and is agreeable to return there. Per patient his brother Winferd Humphrey [phone] is his primary contact. CSW contacted Winferd Humphrey and made him aware of above. Per brother he is patient's HPOA and not his guardian. Per brother patient has been a [facility] for 8 years and he would like for him to return there. FL2 complete. CSW will continue to follow and assist as needed.   Employment status:  Disabled (Comment on whether or not currently receiving Disability) Insurance information:  Medicare,  Medicaid [place] PT Recommendations:  Not assessed at this time Information / Referral to community resources:  [place]  Patient/Family's Response to care:  Patient and his brother are agreeable for patient to D/C back to [facility].   Patient/Family's Understanding of and Emotional Response to Diagnosis, Current Treatment, and Prognosis:  Patient was very pleasant and thanked CSW for assistance.   Emotional Assessment Appearance:  Appears stated age Attitude/Demeanor/Rapport:    Affect (typically observed):  Pleasant Orientation:  Oriented to Self, Oriented to Place, Fluctuating Orientation (Suspected and/or reported Sundowners) Alcohol / Substance use:  Not Applicable Psych involvement (Current and /or in the community):  No (Comment)  Discharge Needs  Concerns to be addressed:  Discharge Planning Concerns Readmission within the last 30 days:  No Current discharge risk:  Dependent with Mobility Barriers to Discharge:  Continued Medical Work up   [employer], Veronia Beets, LCSW 08/13/2018, 2:46 PM

## 2018-08-13 NOTE — Consult Note (Addendum)
[facility] Consult Note  [name] Wiley 12/08/1950  [id number].    Requesting MD: Dr. Rosilyn Mings, MD Chief Complaint/Reason for Consult: Questionable SBO vs Dysmotility   HPI:  [name] Wiley is a 67 y.o. male who presents to [facility] ED this morning via EMS with reports of emesis. Patient's brother helps provide history as patient is developmentally delayed., the patient resides at [facility] and the nursing staff their were concerned because the patient was throwing up "brown" this morning and they thought "theres a chance it could be blood." The patient notes that he has been throwing up for about 2-3 days but the brother is only aware of emesis since this morning. His last bowel movement was reportedly last night and was without blood. No flatus since last night. He otherwise has no additional complaints. Per brother, patient has never had abdominal surgery before. Work up in the ED revealed a concern for possible SBO as well questionable aspiration PNA. He was admitted to the Hospitalist service.   General surgery is consulted by Dr. Rosilyn Mings, MD for evaluation, management, and recommendations regarding the patient's possible small bowel obstruction.    ROS: Review of Systems  Constitutional: Negative for chills and fever.  Respiratory: Negative for cough and shortness of breath.   Cardiovascular: Negative for chest pain and leg swelling.  Gastrointestinal: Positive for nausea and vomiting. Negative for abdominal pain, blood in stool, diarrhea and melena.  All other systems reviewed and are negative.   Family History  Problem Relation Age of Onset  . Congestive Heart Failure Father     Past Medical History:  Diagnosis Date  . Anxiety   . CAD (coronary artery disease)   . Dialysis patient ([place])   . GERD (gastroesophageal reflux disease)   . Hypertension   . Hypothyroid   . Intellectual disability   . Renal disorder   . Seizures ([place])     Past Surgical  History:  Procedure Laterality Date  . A/V SHUNT INTERVENTION N/A 12/02/2017   Procedure: A/V SHUNT INTERVENTION;  Surgeon: Katha Cabal, MD;  Location: [place] [facility];  Service: Cardiovascular;  Laterality: N/A;  . AV FISTULA PLACEMENT Right   . FISTULOGRAM Right 03/06/2015   Procedure: Fistulogram;  Surgeon: Katha Cabal, MD;  Location: [place] [facility];  Service: Cardiovascular;  Laterality: Right;  . INSERT / REPLACE / REMOVE PACEMAKER    . PERIPHERAL VASCULAR CATHETERIZATION N/A 05/01/2016   Procedure: A/V Shunt Intervention/declot, possible permcath placement;  Surgeon: Algernon Huxley, MD;  Location: [place] [facility];  Service: Cardiovascular;  Laterality: N/A;  . PERIPHERAL VASCULAR THROMBECTOMY Right 11/27/2017   Procedure: PERIPHERAL VASCULAR THROMBECTOMY;  Surgeon: Katha Cabal, MD;  Location: [facility];  Service: Cardiovascular;  Laterality: Right;    Social History:  reports that he has never smoked. He has never used smokeless tobacco. He reports that he does not drink alcohol or use drugs.  Allergies: No Known Allergies  Medications Prior to Admission  Medication Sig Dispense Refill  . aspirin 81 MG chewable tablet Chew 81 mg by mouth every evening.    . benztropine (COGENTIN) 1 MG tablet Take 1 mg by mouth 2 (two) times daily.    . calcium acetate (PHOSLO) 667 MG capsule Take 1,334 mg by mouth 3 (three) times daily with meals.    . cholecalciferol (VITAMIN D) 1000 units tablet Take 1,000 Units by mouth daily.    . cinacalcet (SENSIPAR) 90 MG tablet Take  90 mg by mouth 3 (three) times a week. Take on dialysis days    . clonazePAM (KLONOPIN) 0.5 MG tablet Take 1 tablet (0.5 mg total) by mouth 2 (two) times daily. *hold for sedation* 20 tablet 0  . docusate sodium (COLACE) 100 MG capsule Take 1 capsule (100 mg total) by mouth 2 (two) times daily. 10 capsule 0  . Ferrous Fumarate 324 (106 FE) MG TABS Take 1 tablet by mouth daily.    . folic acid  (FOLVITE) 891 MCG tablet Take 400 mcg by mouth daily.     Marland Kitchen guaiFENesin-dextromethorphan (ROBITUSSIN DM) 100-10 MG/5ML syrup Take 10 mLs by mouth every 4 (four) hours as needed for cough.    . haloperidol (HALDOL) 5 MG tablet Take 1 tablet (5 mg total) by mouth 3 (three) times a week. Pt takes on Tuesday, Thursday, and Saturday. (Patient taking differently: Take 5 mg by mouth 3 (three) times a week. Pt takes on Tuesday, Thursday, and Saturday (non-dialysis days)) 20 tablet 0  . haloperidol (HALDOL) 5 MG tablet Take 5 mg by mouth daily as needed for agitation.    Marland Kitchen levothyroxine (SYNTHROID, LEVOTHROID) 300 MCG tablet Take 300 mcg by mouth 2 (two) times daily.     . metolazone (ZAROXOLYN) 2.5 MG tablet Take 2.5 mg by mouth daily.    . multivitamin (RENA-VIT) TABS tablet Take 1 tablet by mouth daily.    . Omega-3 Fatty Acids (FISH OIL) 1000 MG CAPS Take 1,000 mg by mouth 2 (two) times daily.    Marland Kitchen omeprazole (PRILOSEC OTC) 20 MG tablet Take 20 mg by mouth daily.    Marland Kitchen oxcarbazepine (TRILEPTAL) 600 MG tablet Take 600 mg by mouth 2 (two) times daily.    Marland Kitchen oxyCODONE-acetaminophen (PERCOCET/ROXICET) 5-325 MG tablet Take 1 tablet by mouth every 6 (six) hours as needed for moderate pain or severe pain. 30 tablet 0  . phenytoin (DILANTIN) 100 MG ER capsule Take 100-300 mg by mouth See admin instructions. Take 1 capsule by mouth at 6 am, and take 3 capsules (300 mg) by mouth every night at bedtime at 8 pm.    . QUEtiapine (SEROQUEL XR) 300 MG 24 hr tablet Take 300 mg by mouth 3 (three) times daily.     Marland Kitchen senna-docusate (SENOKOT-S) 8.6-50 MG tablet Take 1 tablet by mouth daily. 60 tablet 0  . urea (CARMOL) 40 % CREA Apply 1 application topically daily.    . Nutritional Supplements (FEEDING SUPPLEMENT, NEPRO CARB STEADY,) LIQD Take 237 mLs by mouth daily. 1000 mL 0  . Nutritional Supplements (NEPRO) LIQD Take 240 mLs by mouth daily.      Blood pressure (!) 154/79, pulse (!) 103, temperature 97.6 F (36.4 C),  temperature source Oral, resp. rate (!) 21, height _0  (1.676 m), weight 95.3 kg, SpO2 99 %.   Physical Exam: Physical Exam  Constitutional: He appears well-developed and well-nourished.  HENT:  Head: Normocephalic and atraumatic.  Poor dentition  Eyes: Conjunctivae are normal. No scleral icterus.  Cardiovascular: Normal rate, regular rhythm and normal heart sounds.  No murmur heard. Pulmonary/Chest: Effort normal and breath sounds normal. No respiratory distress. He has no wheezes. He has no rales.  Abdominal: Soft. Bowel sounds are normal. There is no tenderness. There is no rebound and no guarding.  Musculoskeletal: Normal range of motion. He exhibits no deformity.  Neurological: He is alert.  Skin: Skin is warm and dry. He is not diaphoretic.    Results for orders placed or performed during  the hospital encounter of 08/13/18 (from the past 48 hour(s))  Comprehensive metabolic panel     Status: Abnormal   Collection Time: 08/13/18  2:02 AM  Result Value Ref Range   Sodium 137 135 - 145 mmol/L   Potassium 3.5 3.5 - 5.1 mmol/L   Chloride 89 (L) 98 - 111 mmol/L   CO2 34 (H) 22 - 32 mmol/L   Glucose, Bld 129 (H) 70 - 99 mg/dL   BUN 34 (H) 8 - 23 mg/dL   Creatinine, Ser 5.78 (H) 0.61 - 1.24 mg/dL   Calcium 9.5 8.9 - 10.3 mg/dL   Total Protein 7.6 6.5 - 8.1 g/dL   Albumin 3.7 3.5 - 5.0 g/dL   AST 18 15 - 41 U/L   ALT 12 0 - 44 U/L   Alkaline Phosphatase 136 (H) 38 - 126 U/L   Total Bilirubin 0.5 0.3 - 1.2 mg/dL   GFR calc non Af Amer 9 (L) >60 mL/min   GFR calc Af Amer 10 (L) >60 mL/min    Comment: (NOTE) The eGFR has been calculated using the CKD EPI equation. This calculation has not been validated in all clinical situations. eGFR's persistently <60 mL/min signify possible Chronic Kidney Disease.    Anion gap 14 5 - 15    Comment: Performed at [facility], [address] [place], [place] [address]  Lipase, blood     Status: None   Collection Time: 08/13/18   2:02 AM  Result Value Ref Range   Lipase 29 11 - 51 U/L    Comment: Performed at [facility], [address] [place], [place] [address]  Troponin I     Status: Abnormal   Collection Time: 08/13/18  2:02 AM  Result Value Ref Range   Troponin I 0.04 (HH) <0.03 ng/mL    Comment: CRITICAL RESULT CALLED TO, READ BACK BY AND VERIFIED WITH JENNIFER INGERSOL ON 08/13/18 AT 0304 JAG Performed at [facility], [address] [place], [place] [address]   CBC with Differential     Status: Abnormal   Collection Time: 08/13/18  2:02 AM  Result Value Ref Range   WBC 6.8 4.0 - 10.5 K/uL   RBC 3.65 (L) 4.22 - 5.81 MIL/uL   Hemoglobin 11.8 (L) 13.0 - 17.0 g/dL   HCT 35.4 (L) 39.0 - 52.0 %   MCV 97.0 80.0 - 100.0 fL   MCH 32.3 26.0 - 34.0 pg   MCHC 33.3 30.0 - 36.0 g/dL   RDW 16.3 (H) 11.5 - 15.5 %   Platelets 146 (L) 150 - 400 K/uL   nRBC 0.0 0.0 - 0.2 %   Neutrophils Relative % 82 %   Neutro Abs 5.5 1.7 - 7.7 K/uL   Lymphocytes Relative 7 %   Lymphs Abs 0.5 (L) 0.7 - 4.0 K/uL   Monocytes Relative 11 %   Monocytes Absolute 0.8 0.1 - 1.0 K/uL   Eosinophils Relative 0 %   Eosinophils Absolute 0.0 0.0 - 0.5 K/uL   Basophils Relative 0 %   Basophils Absolute 0.0 0.0 - 0.1 K/uL   Immature Granulocytes 0 %   Abs Immature Granulocytes 0.02 0.00 - 0.07 K/uL    Comment: Performed at [facility], [address] [place], [place] [address]  Lactic acid, plasma     Status: None   Collection Time: 08/13/18  2:06 AM  Result Value Ref Range   Lactic Acid, Venous 1.2 0.5 - 1.9 mmol/L  Comment: Performed at [facility], [address] [place], [place] [address]  Phenytoin level, total     Status: Abnormal   Collection Time: 08/13/18  2:24 AM  Result Value Ref Range   Phenytoin Lvl 6.2 (L) 10.0 - 20.0 ug/mL    Comment: Performed at [facility], [address], [place], [place] [address]   Ct Abdomen Pelvis Wo Contrast  Result Date:  08/13/2018 CLINICAL DATA:  Coffee ground emesis. Nausea and vomiting for 3 days. Abdominal infection. EXAM: CT ABDOMEN AND PELVIS WITHOUT CONTRAST TECHNIQUE: Multidetector CT imaging of the abdomen and pelvis was performed following the standard protocol without IV contrast. IV contrast attempted, however IV infiltrate of 18 mL Isovue-300, no IV contrast administered. COMPARISON:  CT 10/17/2013 FINDINGS: Lower chest: Ground-glass nodular opacities in the right lower lobe. Esophagus is distended and fluid-filled. There is ill-defined esophageal wall thickening. Pacemaker wires are partially included. Hepatobiliary: No focal hepatic lesion on noncontrast exam. Gallbladder partially distended. No calcified gallstone. No biliary dilatation. Pancreas: Partially obscured due to adjacent bowel and motion, no ductal dilatation or obvious inflammatory change. Spleen: Normal in size without focal abnormality. Adrenals/Urinary Tract: No adrenal nodule. Bilateral renal parenchymal atrophy. Bilateral renal cysts including small hyperdense cyst in the left kidney, similar to prior exam. No hydronephrosis. Urinary bladder near completely decompressed. Stomach/Bowel: Distended distal esophagus which is fluid-filled. Mild distal esophageal wall thickening. Fluid-filled distended stomach. Proximal small bowel is dilated and fluid-filled. There is general transition from dilated to nondilated small bowel in the lower abdomen without abrupt transition point. Mild small bowel wall thickening in the region of transition, images 56-58 series 2. More distal small bowel is decompressed. Appendix not confidently visualized. Moderate stool burden throughout the colon. No colonic inflammation. Vascular/Lymphatic: Aortic and branch atherosclerosis. No aneurysm. Lack of contrast and motion artifact limits assessment for adenopathy, no bulky adenopathy is seen. Reproductive: Prostate is unremarkable. Other: No free air or ascites.  Musculoskeletal: Sequela of renal osteodystrophy with diffuse osseous sclerosis and endplate changes in the lumbar spine. No acute osseous abnormalities. IMPRESSION: 1. Dilated fluid-filled distal esophagus, stomach, and proximal and mid small bowel. Gradual small bowel transition in the pelvis were there is short/moderate length small bowel wall thickening. Findings suggest partial small bowel obstruction in the region of small bowel thickening. Infectious, inflammatory, as well as neoplastic etiologies are considered for cause of wall thickening and obstruction. 2. Ill-defined esophageal wall thickening, favor reflux or esophagitis. 3. Nodular and ground-glass opacities in the right lower lobe, suspicious for aspiration in the setting. 4.  Aortic Atherosclerosis (ICD10-I70.0). Electronically Signed   By: Keith Rake M.D.   On: 08/13/2018 04:20   Dg Chest Port 1 View  Result Date: 08/13/2018 CLINICAL DATA:  Shortness of breath EXAM: PORTABLE CHEST 1 VIEW COMPARISON:  09/23/2017 FINDINGS: Postsurgical changes at the right thoracic inlet. Multiple vascular stents in the right axillary region. Left cardiac pacing device as before. Stable cardiomegaly. No overt edema or pleural effusion. No focal airspace disease. Dilated small bowel in the upper abdomen. IMPRESSION: 1. Cardiomegaly without acute opacity 2. Dilated small bowel in the upper abdomen, suggest dedicated abdominal radiographs. Electronically Signed   By: Donavan Foil M.D.   On: 08/13/2018 01:44      Assessment/Plan  Small Bowel Obstruction [name] Wiley is a 67 y.o. 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   - Reviewed imaging with Dr. Hampton Abbot, question if CT findings represent small bowel obstruction vs dysmotility given dilation in  stomach/esophagus. No previous abdominal surgeries and no clear transition point on imaging makes obstruction 2/2 adhesions less likely. Recommend GI consult for further evaluation of potential dysmotility/concern for upper GI bleed/additional etiologies of proximal small bowel dilation (inflamatory, malignancy). Also consider GI emptying study - Can hold off NGT as no actively vomiting, if he were to worsen can consider placing this.  - Agree with plan for bowel rest, NPO, IVF - No indication for emergent surgical intervention at this time - Continue to monitor abdominal exam and ongoing bowel function - Mobilize as tolerates - Medical management of chronic medical conditions per primary (Hospitalist)   Edison Simon, PA-C [facility] 08/13/2018, 9:01 AM [phone] M-F: 7am - 4pm

## 2018-08-13 NOTE — Progress Notes (Signed)
[facility], [place] 08/13/18  Subjective:   Patient known to our practice from outpatient dialysis.  [facility] from [facility] for hematemesis, nausea, vomiting and projectile emesis concerning for coffee-grounds.  CT scan of the abdomen shows small bowel obstruction.  Patient is treated conservatively with NG decompression yielding large amount of dark-colored fluid.  Patient's last hemodialysis session was yesterday  Objective:  Vital signs in last 24 hours:  Temp:  [97.6 F (36.4 C)-97.7 F (36.5 C)] 97.7 F (36.5 C) (10/18 0931) Pulse Rate:  [83-103] 102 (10/18 0931) Resp:  [14-21] 20 (10/18 0931) BP: (112-196)/(62-105) 136/82 (10/18 0931) SpO2:  [98 %-100 %] 99 % (10/18 0931) Weight:  [95.3 kg] 95.3 kg (10/18 0127)  Weight change:  Filed Weights   08/13/18 0127  Weight: 95.3 kg    Intake/Output:   No intake or output data in the 24 hours ending 08/13/18 1210   Physical Exam: General:  Ill-appearing, laying in the bed  HEENT  NG tube in place  Neck  supple  Pulm/lungs  shallow breathing effort, no crackles  CVS/Heart  no rub  Abdomen:   Soft, nontender, nondistended  Extremities:  No edema  Neurologic:  Lethargic but able to follow simple commands  Skin:  Normal turgor  Access:  Forearm AV graft       Basic Metabolic Panel:  Recent Labs  Lab 08/13/18 0202  NA 137  K 3.5  CL 89*  CO2 34*  GLUCOSE 129*  BUN 34*  CREATININE 5.78*  CALCIUM 9.5     CBC: Recent Labs  Lab 08/13/18 0202 08/13/18 1044  WBC 6.8  --   NEUTROABS 5.5  --   HGB 11.8* 12.5*  HCT 35.4*  --   MCV 97.0  --   PLT 146*  --       Lab Results  Component Value Date   HEPBSAG Negative 11/27/2017      Microbiology:  No results found for this or any previous visit (from the past 240 hour(s)).  Coagulation Studies: No results for input(s): LABPROT, INR in the last 72 hours.  Urinalysis: No results for input(s): COLORURINE, LABSPEC,  PHURINE, GLUCOSEU, HGBUR, BILIRUBINUR, KETONESUR, PROTEINUR, UROBILINOGEN, NITRITE, LEUKOCYTESUR in the last 72 hours.  Invalid input(s): APPERANCEUR    Imaging: Ct Abdomen Pelvis Wo Contrast  Result Date: 08/13/2018 CLINICAL DATA:  Coffee ground emesis. Nausea and vomiting for 3 days. Abdominal infection. EXAM: CT ABDOMEN AND PELVIS WITHOUT CONTRAST TECHNIQUE: Multidetector CT imaging of the abdomen and pelvis was performed following the standard protocol without IV contrast. IV contrast attempted, however IV infiltrate of 18 mL Isovue-300, no IV contrast administered. COMPARISON:  CT 10/17/2013 FINDINGS: Lower chest: Ground-glass nodular opacities in the right lower lobe. Esophagus is distended and fluid-filled. There is ill-defined esophageal wall thickening. Pacemaker wires are partially included. Hepatobiliary: No focal hepatic lesion on noncontrast exam. Gallbladder partially distended. No calcified gallstone. No biliary dilatation. Pancreas: Partially obscured due to adjacent bowel and motion, no ductal dilatation or obvious inflammatory change. Spleen: Normal in size without focal abnormality. Adrenals/Urinary Tract: No adrenal nodule. Bilateral renal parenchymal atrophy. Bilateral renal cysts including small hyperdense cyst in the left kidney, similar to prior exam. No hydronephrosis. Urinary bladder near completely decompressed. Stomach/Bowel: Distended distal esophagus which is fluid-filled. Mild distal esophageal wall thickening. Fluid-filled distended stomach. Proximal small bowel is dilated and fluid-filled. There is general transition from dilated to nondilated small bowel in the lower abdomen without abrupt transition point. Mild  small bowel wall thickening in the region of transition, images 56-58 series 2. More distal small bowel is decompressed. Appendix not confidently visualized. Moderate stool burden throughout the colon. No colonic inflammation. Vascular/Lymphatic: Aortic and branch  atherosclerosis. No aneurysm. Lack of contrast and motion artifact limits assessment for adenopathy, no bulky adenopathy is seen. Reproductive: Prostate is unremarkable. Other: No free air or ascites. Musculoskeletal: Sequela of renal osteodystrophy with diffuse osseous sclerosis and endplate changes in the lumbar spine. No acute osseous abnormalities. IMPRESSION: 1. Dilated fluid-filled distal esophagus, stomach, and proximal and mid small bowel. Gradual small bowel transition in the pelvis were there is short/moderate length small bowel wall thickening. Findings suggest partial small bowel obstruction in the region of small bowel thickening. Infectious, inflammatory, as well as neoplastic etiologies are considered for cause of wall thickening and obstruction. 2. Ill-defined esophageal wall thickening, favor reflux or esophagitis. 3. Nodular and ground-glass opacities in the right lower lobe, suspicious for aspiration in the setting. 4.  Aortic Atherosclerosis (ICD10-I70.0). Electronically Signed   By: Narda Rutherford M.D.   On: 08/13/2018 04:20   Dg Chest Port 1 View  Result Date: 08/13/2018 CLINICAL DATA:  Shortness of breath EXAM: PORTABLE CHEST 1 VIEW COMPARISON:  09/23/2017 FINDINGS: Postsurgical changes at the right thoracic inlet. Multiple vascular stents in the right axillary region. Left cardiac pacing device as before. Stable cardiomegaly. No overt edema or pleural effusion. No focal airspace disease. Dilated small bowel in the upper abdomen. IMPRESSION: 1. Cardiomegaly without acute opacity 2. Dilated small bowel in the upper abdomen, suggest dedicated abdominal radiographs. Electronically Signed   By: Jasmine Pang M.D.   On: 08/13/2018 01:44     Medications:    . aspirin  81 mg Oral QPM  . benztropine  1 mg Oral BID  . calcium acetate  1,334 mg Oral TID WC  . cholecalciferol  1,000 Units Oral Daily  . cinacalcet  90 mg Oral Once per day on Mon Wed Fri  . clonazePAM  0.5 mg Oral BID   . docusate sodium  100 mg Oral BID  . feeding supplement (NEPRO CARB STEADY)  237 mL Oral Daily  . Ferrous Fumarate  1 tablet Oral Daily  . folic acid  500 mcg Oral Daily  . haloperidol  5 mg Oral Once per day on Mon Wed Fri  . levothyroxine  300 mcg Oral BID  . metolazone  2.5 mg Oral Daily  . multivitamin  1 tablet Oral Daily  . NEPRO  240 mL Oral Daily  . oxcarbazepine  600 mg Oral BID  . pantoprazole  40 mg Oral Daily  . phenytoin  100 mg Oral [id number]  . phenytoin  300 mg Oral QHS  . QUEtiapine  300 mg Oral TID  . senna-docusate  1 tablet Oral Daily   acetaminophen **OR** acetaminophen, guaiFENesin-dextromethorphan, haloperidol, ondansetron **OR** ondansetron (ZOFRAN) IV, oxyCODONE-acetaminophen  Assessment/ Plan:  67 y.o. African-American male with end-stage renal disease, hypertension, mild mental retardation, seizure disorder, hypothyroidism  [facility] / [address]/right arm AV graft/225 min/ TTS/95 KG  1.  End-stage renal disease 2.  Anemia of chronic kidney disease 3.  Secondary hyperparathyroidism 4.  Small bowel obstruction causing nausea, vomiting  Plan: Treatment of small bowel obstruction as per GI/surgery team Dialysis planned for tomorrow.  Patient close to dry weight.  No fluid to be removed Continue Epogen with hemodialysis Hold phosphorus binders until he is able to eat normal diet    LOS: 0 Kameron Blethen 10/18/201912:10  PM  [facility] [address] [place], [place] [phone]  Note: This note was prepared with Dragon dictation. Any transcription errors are unintentional

## 2018-08-13 NOTE — H&P (Signed)
[name] Wiley is an 67 y.o. male.   Chief Complaint: Hematemesis HPI: Patient with past medical history of hypothyroidism, hypertension, end-stage renal disease on dialysis and seizures presents to the emergency department with hematemesis.  Report from the patient's nursing home is that he has had some coffee-ground emesis and 3 episodes of vomiting.  His behavior does not seem to have changed.  CT of his abdomen revealed gradual small bowel transition consistent with partial bowel obstruction as well as some esophageal wall thickening.  The patient may have aspirated as well based on the appearance of some groundglass opacities in his right lower lung.  Notably, he is afebrile, normal tip neck and oxygen saturations are normal on room air.  Due to these findings the emergency department staff called the hospitalist service for admission.  Past Medical History:  Diagnosis Date  . Anxiety   . CAD (coronary artery disease)   . Dialysis patient ([place])   . GERD (gastroesophageal reflux disease)   . Hypertension   . Hypothyroid   . Intellectual disability   . Renal disorder   . Seizures ([place])     Past Surgical History:  Procedure Laterality Date  . A/V SHUNT INTERVENTION N/A 12/02/2017   Procedure: A/V SHUNT INTERVENTION;  Surgeon: Katha Cabal, MD;  Location: [facility];  Service: Cardiovascular;  Laterality: N/A;  . AV FISTULA PLACEMENT Right   . FISTULOGRAM Right 03/06/2015   Procedure: Fistulogram;  Surgeon: Katha Cabal, MD;  Location: [facility];  Service: Cardiovascular;  Laterality: Right;  . INSERT / REPLACE / REMOVE PACEMAKER    . PERIPHERAL VASCULAR CATHETERIZATION N/A 05/01/2016   Procedure: A/V Shunt Intervention/declot, possible permcath placement;  Surgeon: Algernon Huxley, MD;  Location: [facility];  Service: Cardiovascular;  Laterality: N/A;  . PERIPHERAL VASCULAR THROMBECTOMY Right 11/27/2017   Procedure: PERIPHERAL VASCULAR THROMBECTOMY;   Surgeon: Katha Cabal, MD;  Location: [facility];  Service: Cardiovascular;  Laterality: Right;    Family History  Problem Relation Age of Onset  . Congestive Heart Failure Father    Social History:  reports that he has never smoked. He has never used smokeless tobacco. He reports that he does not drink alcohol or use drugs.  Allergies: No Known Allergies  Prior to Admission medications   Medication Sig Start Date End Date Taking? Authorizing Provider  aspirin 81 MG chewable tablet Chew 81 mg by mouth every evening.   Yes [provider]  benztropine (COGENTIN) 1 MG tablet Take 1 mg by mouth 2 (two) times daily.   Yes [provider]  calcium acetate (PHOSLO) 667 MG capsule Take 1,334 mg by mouth 3 (three) times daily with meals.   Yes [provider]  cholecalciferol (VITAMIN D) 1000 units tablet Take 1,000 Units by mouth daily.   Yes [provider]  cinacalcet (SENSIPAR) 90 MG tablet Take 90 mg by mouth 3 (three) times a week. Take on dialysis days   Yes [provider]  clonazePAM (KLONOPIN) 0.5 MG tablet Take 1 tablet (0.5 mg total) by mouth 2 (two) times daily. *hold for sedation* 05/01/16  Yes Gladstone Lighter, MD  docusate sodium (COLACE) 100 MG capsule Take 1 capsule (100 mg total) by mouth 2 (two) times daily. 05/01/16  Yes Gladstone Lighter, MD  Ferrous Fumarate 324 (106 FE) MG TABS Take 1 tablet by mouth daily.   Yes [provider]  folic acid (FOLVITE) 283 MCG tablet Take 400  mcg by mouth daily.    Yes [provider]  guaiFENesin-dextromethorphan (ROBITUSSIN DM) 100-10 MG/5ML syrup Take 10 mLs by mouth every 4 (four) hours as needed for cough.   Yes [provider]  haloperidol (HALDOL) 5 MG tablet Take 1 tablet (5 mg total) by mouth 3 (three) times a week. Pt takes on Tuesday, Thursday, and Saturday. Patient taking differently: Take 5 mg by mouth 3 (three) times a week. Pt takes on Tuesday,  Thursday, and Saturday (non-dialysis days) 05/01/16  Yes Gladstone Lighter, MD  haloperidol (HALDOL) 5 MG tablet Take 5 mg by mouth daily as needed for agitation.   Yes [provider]  levothyroxine (SYNTHROID, LEVOTHROID) 300 MCG tablet Take 300 mcg by mouth 2 (two) times daily.    Yes [provider]  metolazone (ZAROXOLYN) 2.5 MG tablet Take 2.5 mg by mouth daily.   Yes [provider]  multivitamin (RENA-VIT) TABS tablet Take 1 tablet by mouth daily.   Yes [provider]  Omega-3 Fatty Acids (FISH OIL) 1000 MG CAPS Take 1,000 mg by mouth 2 (two) times daily.   Yes [provider]  omeprazole (PRILOSEC OTC) 20 MG tablet Take 20 mg by mouth daily.   Yes [provider]  oxcarbazepine (TRILEPTAL) 600 MG tablet Take 600 mg by mouth 2 (two) times daily.   Yes [provider]  oxyCODONE-acetaminophen (PERCOCET/ROXICET) 5-325 MG tablet Take 1 tablet by mouth every 6 (six) hours as needed for moderate pain or severe pain. 05/01/16  Yes Gladstone Lighter, MD  phenytoin (DILANTIN) 100 MG ER capsule Take 100-300 mg by mouth See admin instructions. Take 1 capsule by mouth at 6 am, and take 3 capsules (300 mg) by mouth every night at bedtime at 8 pm.   Yes [provider]  QUEtiapine (SEROQUEL XR) 300 MG 24 hr tablet Take 300 mg by mouth 3 (three) times daily.    Yes [provider]  senna-docusate (SENOKOT-S) 8.6-50 MG tablet Take 1 tablet by mouth daily. 05/01/16  Yes Gladstone Lighter, MD  urea (CARMOL) 40 % CREA Apply 1 application topically daily.   Yes [provider]  Nutritional Supplements (FEEDING SUPPLEMENT, NEPRO CARB STEADY,) LIQD Take 237 mLs by mouth daily. 05/01/16   Gladstone Lighter, MD  Nutritional Supplements (NEPRO) LIQD Take 240 mLs by mouth daily.    [provider]     Results for orders placed or performed during the hospital encounter of 08/13/18 (from the past 48 hour(s))   Comprehensive metabolic panel     Status: Abnormal   Collection Time: 08/13/18  2:02 AM  Result Value Ref Range   Sodium 137 135 - 145 mmol/L   Potassium 3.5 3.5 - 5.1 mmol/L   Chloride 89 (L) 98 - 111 mmol/L   CO2 34 (H) 22 - 32 mmol/L   Glucose, Bld 129 (H) 70 - 99 mg/dL   BUN 34 (H) 8 - 23 mg/dL   Creatinine, Ser 5.78 (H) 0.61 - 1.24 mg/dL   Calcium 9.5 8.9 - 10.3 mg/dL   Total Protein 7.6 6.5 - 8.1 g/dL   Albumin 3.7 3.5 - 5.0 g/dL   AST 18 15 - 41 U/L   ALT 12 0 - 44 U/L   Alkaline Phosphatase 136 (H) 38 - 126 U/L   Total Bilirubin 0.5 0.3 - 1.2 mg/dL   GFR calc non Af Amer 9 (L) >60 mL/min   GFR calc Af Amer 10 (L) >60 mL/min    Comment: (  NOTE) The eGFR has been calculated using the CKD EPI equation. This calculation has not been validated in all clinical situations. eGFR's persistently <60 mL/min signify possible Chronic Kidney Disease.    Anion gap 14 5 - 15    Comment: Performed at [facility], [address] [place], [place] [address]  Lipase, blood     Status: None   Collection Time: 08/13/18  2:02 AM  Result Value Ref Range   Lipase 29 11 - 51 U/L    Comment: Performed at [facility], [address] [place], [place] [address]  Troponin I     Status: Abnormal   Collection Time: 08/13/18  2:02 AM  Result Value Ref Range   Troponin I 0.04 (HH) <0.03 ng/mL    Comment: CRITICAL RESULT CALLED TO, READ BACK BY AND VERIFIED WITH JENNIFER INGERSOL ON 08/13/18 AT 0304 JAG Performed at [facility], [address] [place], [place] [address]   CBC with Differential     Status: Abnormal   Collection Time: 08/13/18  2:02 AM  Result Value Ref Range   WBC 6.8 4.0 - 10.5 K/uL   RBC 3.65 (L) 4.22 - 5.81 MIL/uL   Hemoglobin 11.8 (L) 13.0 - 17.0 g/dL   HCT 35.4 (L) 39.0 - 52.0 %   MCV 97.0 80.0 - 100.0 fL   MCH 32.3 26.0 - 34.0 pg   MCHC 33.3 30.0 - 36.0 g/dL   RDW 16.3 (H) 11.5 - 15.5 %   Platelets 146 (L) 150 - 400 K/uL   nRBC 0.0 0.0  - 0.2 %   Neutrophils Relative % 82 %   Neutro Abs 5.5 1.7 - 7.7 K/uL   Lymphocytes Relative 7 %   Lymphs Abs 0.5 (L) 0.7 - 4.0 K/uL   Monocytes Relative 11 %   Monocytes Absolute 0.8 0.1 - 1.0 K/uL   Eosinophils Relative 0 %   Eosinophils Absolute 0.0 0.0 - 0.5 K/uL   Basophils Relative 0 %   Basophils Absolute 0.0 0.0 - 0.1 K/uL   Immature Granulocytes 0 %   Abs Immature Granulocytes 0.02 0.00 - 0.07 K/uL    Comment: Performed at [facility], [address] [place], [place] [address]  Lactic acid, plasma     Status: None   Collection Time: 08/13/18  2:06 AM  Result Value Ref Range   Lactic Acid, Venous 1.2 0.5 - 1.9 mmol/L    Comment: Performed at [facility], [address] [place], [place] [address]  Phenytoin level, total     Status: Abnormal   Collection Time: 08/13/18  2:24 AM  Result Value Ref Range   Phenytoin Lvl 6.2 (L) 10.0 - 20.0 ug/mL    Comment: Performed at [facility], [address], [place], [place] [address]   Ct Abdomen Pelvis Wo Contrast  Result Date: 08/13/2018 CLINICAL DATA:  Coffee ground emesis. Nausea and vomiting for 3 days. Abdominal infection. EXAM: CT ABDOMEN AND PELVIS WITHOUT CONTRAST TECHNIQUE: Multidetector CT imaging of the abdomen and pelvis was performed following the standard protocol without IV contrast. IV contrast attempted, however IV infiltrate of 18 mL Isovue-300, no IV contrast administered. COMPARISON:  CT 10/17/2013 FINDINGS: Lower chest: Ground-glass nodular opacities in the right lower lobe. Esophagus is distended and fluid-filled. There is ill-defined esophageal wall thickening. Pacemaker wires are partially included. Hepatobiliary: No focal hepatic lesion on noncontrast exam. Gallbladder partially distended. No calcified gallstone. No biliary dilatation. Pancreas: Partially obscured due to adjacent bowel  and motion, no ductal dilatation or obvious inflammatory change. Spleen: Normal in size without  focal abnormality. Adrenals/Urinary Tract: No adrenal nodule. Bilateral renal parenchymal atrophy. Bilateral renal cysts including small hyperdense cyst in the left kidney, similar to prior exam. No hydronephrosis. Urinary bladder near completely decompressed. Stomach/Bowel: Distended distal esophagus which is fluid-filled. Mild distal esophageal wall thickening. Fluid-filled distended stomach. Proximal small bowel is dilated and fluid-filled. There is general transition from dilated to nondilated small bowel in the lower abdomen without abrupt transition point. Mild small bowel wall thickening in the region of transition, images 56-58 series 2. More distal small bowel is decompressed. Appendix not confidently visualized. Moderate stool burden throughout the colon. No colonic inflammation. Vascular/Lymphatic: Aortic and branch atherosclerosis. No aneurysm. Lack of contrast and motion artifact limits assessment for adenopathy, no bulky adenopathy is seen. Reproductive: Prostate is unremarkable. Other: No free air or ascites. Musculoskeletal: Sequela of renal osteodystrophy with diffuse osseous sclerosis and endplate changes in the lumbar spine. No acute osseous abnormalities. IMPRESSION: 1. Dilated fluid-filled distal esophagus, stomach, and proximal and mid small bowel. Gradual small bowel transition in the pelvis were there is short/moderate length small bowel wall thickening. Findings suggest partial small bowel obstruction in the region of small bowel thickening. Infectious, inflammatory, as well as neoplastic etiologies are considered for cause of wall thickening and obstruction. 2. Ill-defined esophageal wall thickening, favor reflux or esophagitis. 3. Nodular and ground-glass opacities in the right lower lobe, suspicious for aspiration in the setting. 4.  Aortic Atherosclerosis (ICD10-I70.0). Electronically Signed   By: Keith Rake M.D.   On: 08/13/2018 04:20   Dg Chest Port 1 View  Result Date:  08/13/2018 CLINICAL DATA:  Shortness of breath EXAM: PORTABLE CHEST 1 VIEW COMPARISON:  09/23/2017 FINDINGS: Postsurgical changes at the right thoracic inlet. Multiple vascular stents in the right axillary region. Left cardiac pacing device as before. Stable cardiomegaly. No overt edema or pleural effusion. No focal airspace disease. Dilated small bowel in the upper abdomen. IMPRESSION: 1. Cardiomegaly without acute opacity 2. Dilated small bowel in the upper abdomen, suggest dedicated abdominal radiographs. Electronically Signed   By: Donavan Foil M.D.   On: 08/13/2018 01:44    Review of Systems  Unable to perform ROS: Mental status change    Blood pressure (!) 171/85, pulse 100, temperature 97.6 F (36.4 C), temperature source Oral, resp. rate 18, height _0  (1.676 m), weight 95.3 kg, SpO2 99 %. Physical Exam  Vitals reviewed. Constitutional: He appears well-developed and well-nourished. No distress.  HENT:  Head: Normocephalic and atraumatic.  Mouth/Throat: Oropharynx is clear and moist.  Eyes: Pupils are equal, round, and reactive to light. Conjunctivae and EOM are normal. No scleral icterus.  Neck: Normal range of motion. Neck supple. No JVD present. No tracheal deviation present. No thyromegaly present.  Cardiovascular: Normal rate, regular rhythm and normal heart sounds. Exam reveals no gallop and no friction rub.  No murmur heard. Respiratory: Effort normal and breath sounds normal. No respiratory distress.  GI: Soft. Bowel sounds are normal. He exhibits no distension. There is no tenderness.  Genitourinary:  Genitourinary Comments: Deferred  Musculoskeletal: Normal range of motion. He exhibits no edema.  Lymphadenopathy:    He has no cervical adenopathy.  Neurological: He is alert. No cranial nerve deficit.  Cannot ascertain if patient is oriented but he does try to respond to questions  Skin: Skin is warm and dry. No rash noted. No erythema.  Psychiatric:  Difficult to  assess as the  patient's words are slurred and he is inattentive to medical interview     Assessment/Plan This is a 67 year old male admitted for small bowel obstruction. 1.  Small bowel obstruction: Partial; bowel rest and hydration.  She does not appear to be in pain at this time.  Consult surgical service for further recommendations. 2.  ESRD: On dialysis Tuesday, Thursday, Saturday.  Continue Zaroxolyn per home regimen.  Consult nephrology for continuation of dialysis.  Also continue Sensipar and PhosLo 3.  Hypothyroidism: Check TSH; continue Synthroid 4.  Seizure disorder: Continue Trileptal and Dilantin 5.  Mood disorder: Patient's mental status probably altered by Seroquel and Haldol used for behavioral control. 6.  DVT prophylaxis: SCDs 7.  GI prophylaxis: Omeprazole per home regimen The patient is a full code.  Time spent on admission orders and patient care approximately 45 minutes  Harrie Foreman, MD 08/13/2018, 7:58 AM

## 2018-08-13 NOTE — ED Provider Notes (Signed)
[facility] Emergency Department Provider Note  ____________________________________________   First MD Initiated Contact with Patient 08/13/18 0115     (approximate)  I have reviewed the triage vital signs and the nursing notes.   HISTORY  Chief Complaint Hematemesis  Level 5 exemption history is limited by the patient's intellectual delay  HPI [name] Wiley is a 67 y.o. male who comes to the emergency department from his nursing home for 3 days of vomiting and 1 day of hematemesis.  According to EMS the staff at the nursing home noted that there was a large amount of "coffee-ground emesis" which prompted the visit.  The patient himself has a long-standing history of intellectual delay  and communicates in 1-2 word sentences.  He is only able to say "no pain" and has no other complaints at this time.  He does have a history of hemodialysis and was most recently dialyzed yesterday via graft in his right upper extremity.   Past Medical History:  Diagnosis Date  . Anxiety   . CAD (coronary artery disease)   . Dialysis patient (HCC)   . GERD (gastroesophageal reflux disease)   . Hypertension   . Hypothyroid   . Intellectual disability   . Renal disorder   . Seizures [facility])     Patient Active Problem List   Diagnosis Date Noted  . SBO (small bowel obstruction) (HCC) 08/13/2018  . 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 Hyperkalemia 04/26/2016  . Hyponatremia 04/26/2016  . Pancytopenia (HCC) 04/26/2016  . End stage renal disease (HCC) 04/26/2016  . 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 A/V SHUNT INTERVENTION N/A 12/02/2017   Procedure: A/V SHUNT INTERVENTION;  Surgeon: Renford Dills, MD;  Location: [facility] INVASIVE CV LAB;  Service: Cardiovascular;  Laterality: N/A;  . AV FISTULA PLACEMENT Right   . FISTULOGRAM Right 03/06/2015   Procedure: Fistulogram;  Surgeon: Renford Dills, MD;   Location: [facility] INVASIVE CV LAB;  Service: Cardiovascular;  Laterality: Right;  . INSERT / REPLACE / REMOVE PACEMAKER    . PERIPHERAL VASCULAR CATHETERIZATION N/A 05/01/2016   Procedure: A/V Shunt Intervention/declot, possible permcath placement;  Surgeon: Annice Needy, MD;  Location: [facility] INVASIVE CV LAB;  Service: Cardiovascular;  Laterality: N/A;  . PERIPHERAL VASCULAR THROMBECTOMY Right 11/27/2017   Procedure: PERIPHERAL VASCULAR THROMBECTOMY;  Surgeon: Renford Dills, MD;  Location: [facility] INVASIVE CV LAB;  Service: Cardiovascular;  Laterality: Right;    Prior to Admission medications   Medication Sig Start Date End Date Taking? Authorizing Provider  aspirin 81 MG chewable tablet Chew 81 mg by mouth every evening.   Yes [provider]  benztropine (COGENTIN) 1 MG tablet Take 1 mg by mouth 2 (two) times daily.   Yes [provider]  calcium acetate (PHOSLO) 667 MG capsule Take 1,334 mg by mouth 3 (three) times daily with meals.   Yes [provider]  cholecalciferol (VITAMIN D) 1000 units tablet Take 1,000 Units by mouth daily.   Yes [provider]  cinacalcet (SENSIPAR) 90 MG tablet Take 90 mg by mouth 3 (three) times a week. Take on dialysis days   Yes [provider]  clonazePAM (KLONOPIN) 0.5 MG tablet Take 1 tablet (0.5 mg total) by mouth 2 (two) times daily. *hold for sedation* 05/01/16  Yes Enid Baas, MD  docusate sodium (COLACE) 100 MG capsule Take 1 capsule (100 mg total) by mouth 2 (two) times daily.  05/01/16  Yes Enid Baas, MD  Ferrous Fumarate 324 (106 FE) MG TABS Take 1 tablet by mouth daily.   Yes [provider]  folic acid (FOLVITE) 400 MCG tablet Take 400 mcg by mouth daily.    Yes [provider]  guaiFENesin-dextromethorphan (ROBITUSSIN DM) 100-10 MG/5ML syrup Take 10 mLs by mouth every 4 (four) hours as needed for cough.   Yes [provider]  haloperidol (HALDOL) 5 MG tablet Take 1 tablet  (5 mg total) by mouth 3 (three) times a week. Pt takes on Tuesday, Thursday, and Saturday. Patient taking differently: Take 5 mg by mouth 3 (three) times a week. Pt takes on Tuesday, Thursday, and Saturday (non-dialysis days) 05/01/16  Yes Enid Baas, MD  haloperidol (HALDOL) 5 MG tablet Take 5 mg by mouth daily as needed for agitation.   Yes [provider]  levothyroxine (SYNTHROID, LEVOTHROID) 300 MCG tablet Take 300 mcg by mouth 2 (two) times daily.    Yes [provider]  metolazone (ZAROXOLYN) 2.5 MG tablet Take 2.5 mg by mouth daily.   Yes [provider]  multivitamin (RENA-VIT) TABS tablet Take 1 tablet by mouth daily.   Yes [provider]  Omega-3 Fatty Acids (FISH OIL) 1000 MG CAPS Take 1,000 mg by mouth 2 (two) times daily.   Yes [provider]  omeprazole (PRILOSEC OTC) 20 MG tablet Take 20 mg by mouth daily.   Yes [provider]  oxcarbazepine (TRILEPTAL) 600 MG tablet Take 600 mg by mouth 2 (two) times daily.   Yes [provider]  oxyCODONE-acetaminophen (PERCOCET/ROXICET) 5-325 MG tablet Take 1 tablet by mouth every 6 (six) hours as needed for moderate pain or severe pain. 05/01/16  Yes Enid Baas, MD  phenytoin (DILANTIN) 100 MG ER capsule Take 100-300 mg by mouth See admin instructions. Take 1 capsule by mouth at 6 am, and take 3 capsules (300 mg) by mouth every night at bedtime at 8 pm.   Yes [provider]  QUEtiapine (SEROQUEL XR) 300 MG 24 hr tablet Take 300 mg by mouth 3 (three) times daily.    Yes [provider]  senna-docusate (SENOKOT-S) 8.6-50 MG tablet Take 1 tablet by mouth daily. 05/01/16  Yes Enid Baas, MD  urea (CARMOL) 40 % CREA Apply 1 application topically daily.   Yes [provider]  Nutritional Supplements (FEEDING SUPPLEMENT, NEPRO CARB STEADY,) LIQD Take 237 mLs by mouth daily. 05/01/16   Enid Baas, MD  Nutritional Supplements (NEPRO) LIQD  Take 240 mLs by mouth daily.    [provider]    Allergies Patient has no known allergies.  Family History  Problem Relation Age of Onset  . Congestive Heart Failure Father     Social History Social History   Tobacco Use  . Smoking status: Never Smoker  . Smokeless tobacco: Never Used  Substance Use Topics  . Alcohol use: No  . Drug use: No    Review of Systems Level 5 exemption history limited by the patient's intellectual delay  ____________________________________________   PHYSICAL EXAM:  VITAL SIGNS: ED Triage Vitals  Enc Vitals Group     BP      Pulse      Resp      Temp      Temp src      SpO2      Weight      Height      Head Circumference      Peak Flow  Pain Score      Pain Loc      Pain Edu?      Excl. in GC?     Constitutional: Pleasant cooperative speaks in 1-2 word sentences Eyes: PERRL EOMI. midrange and brisk Head: Atraumatic. Nose: No congestion/rhinnorhea. Mouth/Throat: No trismus Neck: No stridor.   Cardiovascular: Normal rate, regular rhythm. Grossly normal heart sounds.  Good peripheral circulation. Respiratory: Normal respiratory effort.  No retractions. Lungs CTAB and moving good air Gastrointestinal: Soft nontender no peritonitis Musculoskeletal: Shunt right upper extremity with good thrill Neurologic:   No gross focal neurologic deficits are appreciated. Skin:  Skin is warm, dry and intact. No rash noted. Psychiatric: Profound intellectual delay    ____________________________________________   DIFFERENTIAL includes but not limited to  Upper GI bleed, gastroenteritis, viral syndrome, bowel obstruction ____________________________________________   LABS (all labs ordered are listed, but only abnormal results are displayed)  Labs Reviewed  COMPREHENSIVE METABOLIC PANEL - Abnormal; Notable for the following components:      Result Value   Chloride 89 (*)    CO2 34 (*)    Glucose, Bld 129 (*)    BUN  34 (*)    Creatinine, Ser 5.78 (*)    Alkaline Phosphatase 136 (*)    GFR calc non Af Amer 9 (*)    GFR calc Af Amer 10 (*)    All other components within normal limits  TROPONIN I - Abnormal; Notable for the following components:   Troponin I 0.04 (*)    All other components within normal limits  CBC WITH DIFFERENTIAL/PLATELET - Abnormal; Notable for the following components:   RBC 3.65 (*)    Hemoglobin 11.8 (*)    HCT 35.4 (*)    RDW 16.3 (*)    Platelets 146 (*)    Lymphs Abs 0.5 (*)    All other components within normal limits  PHENYTOIN LEVEL, TOTAL - Abnormal; Notable for the following components:   Phenytoin Lvl 6.2 (*)    All other components within normal limits  HEMOGLOBIN - Abnormal; Notable for the following components:   Hemoglobin 12.0 (*)    All other components within normal limits  TSH - Abnormal; Notable for the following components:   TSH 0.077 (*)    All other components within normal limits  HEMOGLOBIN - Abnormal; Notable for the following components:   Hemoglobin 12.5 (*)    All other components within normal limits  HEMOGLOBIN - Abnormal; Notable for the following components:   Hemoglobin 11.3 (*)    All other components within normal limits  MRSA PCR SCREENING  LIPASE, BLOOD  LACTIC ACID, PLASMA  URINALYSIS, COMPLETE (UACMP) WITH MICROSCOPIC  HEMOGLOBIN    Lab work reviewed by me shows no drop in his hemoglobin.  His creatinine is elevated but this is chronic and he has no indication for emergent dialysis __________________________________________  EKG  ED ECG REPORT I, Merrily Brittle, the attending physician, personally viewed and interpreted this ECG.  Date: 08/14/2018 EKG Time:  Rate: 83 Rhythm: normal sinus rhythm QRS Axis: Leftward axis Intervals: normal ST/T Wave abnormalities: normal Narrative Interpretation: no evidence of acute ischemia  ____________________________________________  RADIOLOGY  CT abdomen pelvis reviewed by  me consistent with partial small bowel obstruction ____________________________________________   PROCEDURES  Procedure(s) performed: yes  Angiocath insertion Performed by: Merrily Brittle  Consent: Verbal consent obtained. Risks and benefits: risks, benefits and alternatives were discussed Time out: Immediately prior to procedure a "time out" was called to verify  the correct patient, procedure, equipment, support staff and site/side marked as required.  Preparation: Patient was prepped and draped in the usual sterile fashion.  Vein Location: left AC  Ultrasound Guided  Gauge: 20  Normal blood return and flush without difficulty Patient tolerance: Patient tolerated the procedure well with no immediate complications.  Angiocath insertion Performed by: Merrily Brittle  Consent: Verbal consent obtained. Risks and benefits: risks, benefits and alternatives were discussed Time out: Immediately prior to procedure a "time out" was called to verify the correct patient, procedure, equipment, support staff and site/side marked as required.  Preparation: Patient was prepped and draped in the usual sterile fashion.  Vein Location: left upper extremity  Ultrasound Guided  Gauge: 20  Normal blood return and flush without difficulty Patient tolerance: Patient tolerated the procedure well with no immediate complications.      Procedures  Critical Care performed: no  ____________________________________________   INITIAL IMPRESSION / ASSESSMENT AND PLAN / ED COURSE  Pertinent labs & imaging results that were available during my care of the patient were reviewed by me and considered in my medical decision making (see chart for details).   As part of my medical decision making, I reviewed the following data within the electronic MEDICAL RECORD NUMBER History obtained from family if available, nursing notes, old chart and ekg, as well as notes from prior ED visits.  The patient  comes to the emergency department with 3 days of nausea vomiting and 1 day of what sounds like an upper GI bleed.  History is very challenging to obtain from the patient secondary to his chronic intellectual delay.  I placed an IV in his left upper extremity however unfortunately prior to labs or medications the patient inadvertently removed it.  I then placed a second IV and labs were drawn and the patient was sent off to CT scan.  That second IV blew and the CT scan so his scan was done without contrast.  That scan does show a partial small bowel obstruction.  That in conjunction with likely upper GI bleed would necessitate an inpatient admission.  Defer NG tube at this point as it is not a complete obstruction.  Have given IV antiemetics along with IV Protonix for the upper GI bleed.  I spoke with the hospitalist Dr. Sheryle Hail who has graciously agreed to admit the patient to his service.      ____________________________________________   FINAL CLINICAL IMPRESSION(S) / ED DIAGNOSES  Final diagnoses:  Hematemesis with nausea  Partial small bowel obstruction (HCC)      NEW MEDICATIONS STARTED DURING THIS VISIT:  Current Discharge Medication List       Note:  This document was prepared using Dragon voice recognition software and may include unintentional dictation errors.     Merrily Brittle, MD 08/14/18 [phone]

## 2018-08-13 NOTE — Consult Note (Addendum)
[name] Wiley , MD [address], [place], [place], [address] [address], [address], [place], [place], [address] Phone: [phone]  Fax: [phone]  Consultation  Referring Provider:   Dr Nemiah Commander Primary Care Physician:  Clinic, [facility] Primary Gastroenterologist:  None    Reason for Consultation:     SBO  Date of Admission:  08/13/2018 Date of Consultation:  08/13/2018         HPI:   [name] Wiley is a 67 y.o. male presented to the hospital early today with presenting complaint of hematemesis.  At presentation he underwent a CT abdomen and pelvis.  It noted that his esophagus was distended and fluid-filled there was ill-defined esophageal wall thickening, no biliary dilation, fluid-filled distended stomach, proximal small bowel is dilated and fluid-filled.  There is general transition from dilated to nondilated small bowel in the lower abdomen without abrupt transition point.  Mild small bowel wall thickening in the region of transition.  The distal small bowel was decompressed.  Moderate stool burden throughout the colon.  On admission hemoglobin was at his baseline close to 11 g.  Prior blood results showed that his hemoglobin has hovered around the 11 g mark with an MCV of 104.  Lipase is normal, troponin elevated at 0.04.  When I went to see him in his room he was having the presence of his sister-in-law.  She stated that the patient has issues with speech and impairment of his mental status.  She stated that he has had issues with "regurgitation of dark liquid" over the last few days the patient states that he has been throwing up for a few days.  He denies any weight loss, abdominal distention, abdominal pain, NSAID use, abdominal surgeries.  When I went into the room that just placed an NG tube and connected to suction and there was thick brown feculent material collecting in the canister.  He denies any complaints at this point of time.  Past Medical  History:  Diagnosis Date  . Anxiety   . CAD (coronary artery disease)   . Dialysis patient (HCC)   . GERD (gastroesophageal reflux disease)   . Hypertension   . Hypothyroid   . Intellectual disability   . Renal disorder   . Seizures (HCC)     Past Surgical History:  Procedure Laterality Date  . A/V SHUNT INTERVENTION N/A 12/02/2017   Procedure: A/V SHUNT INTERVENTION;  Surgeon: Renford Dills, MD;  Location: [facility] INVASIVE CV LAB;  Service: Cardiovascular;  Laterality: N/A;  . AV FISTULA PLACEMENT Right   . FISTULOGRAM Right 03/06/2015   Procedure: Fistulogram;  Surgeon: Renford Dills, MD;  Location: [facility] INVASIVE CV LAB;  Service: Cardiovascular;  Laterality: Right;  . INSERT / REPLACE / REMOVE PACEMAKER    . PERIPHERAL VASCULAR CATHETERIZATION N/A 05/01/2016   Procedure: A/V Shunt Intervention/declot, possible permcath placement;  Surgeon: Annice Needy, MD;  Location: [facility] INVASIVE CV LAB;  Service: Cardiovascular;  Laterality: N/A;  . PERIPHERAL VASCULAR THROMBECTOMY Right 11/27/2017   Procedure: PERIPHERAL VASCULAR THROMBECTOMY;  Surgeon: Renford Dills, MD;  Location: [facility] INVASIVE CV LAB;  Service: Cardiovascular;  Laterality: Right;    Prior to Admission medications   Medication Sig Start Date End Date Taking? Authorizing Provider  aspirin 81 MG chewable tablet Chew 81 mg by mouth every evening.   Yes [provider]  benztropine (COGENTIN) 1 MG tablet Take 1 mg by mouth 2 (two) times daily.   Yes [provider]  calcium acetate (PHOSLO) 667 MG capsule Take 1,334 mg by mouth 3 (three) times daily with meals.   Yes [provider]  cholecalciferol (VITAMIN D) 1000 units tablet Take 1,000 Units by mouth daily.   Yes [provider]  cinacalcet (SENSIPAR) 90 MG tablet Take 90 mg by mouth 3 (three) times a week. Take on dialysis days   Yes [provider]  clonazePAM (KLONOPIN) 0.5 MG tablet Take 1 tablet (0.5 mg total) by mouth 2  (two) times daily. *hold for sedation* 05/01/16  Yes Enid Baas, MD  docusate sodium (COLACE) 100 MG capsule Take 1 capsule (100 mg total) by mouth 2 (two) times daily. 05/01/16  Yes Enid Baas, MD  Ferrous Fumarate 324 (106 FE) MG TABS Take 1 tablet by mouth daily.   Yes [provider]  folic acid (FOLVITE) 400 MCG tablet Take 400 mcg by mouth daily.    Yes [provider]  guaiFENesin-dextromethorphan (ROBITUSSIN DM) 100-10 MG/5ML syrup Take 10 mLs by mouth every 4 (four) hours as needed for cough.   Yes [provider]  haloperidol (HALDOL) 5 MG tablet Take 1 tablet (5 mg total) by mouth 3 (three) times a week. Pt takes on Tuesday, Thursday, and Saturday. Patient taking differently: Take 5 mg by mouth 3 (three) times a week. Pt takes on Tuesday, Thursday, and Saturday (non-dialysis days) 05/01/16  Yes Enid Baas, MD  haloperidol (HALDOL) 5 MG tablet Take 5 mg by mouth daily as needed for agitation.   Yes [provider]  levothyroxine (SYNTHROID, LEVOTHROID) 300 MCG tablet Take 300 mcg by mouth 2 (two) times daily.    Yes [provider]  metolazone (ZAROXOLYN) 2.5 MG tablet Take 2.5 mg by mouth daily.   Yes [provider]  multivitamin (RENA-VIT) TABS tablet Take 1 tablet by mouth daily.   Yes [provider]  Omega-3 Fatty Acids (FISH OIL) 1000 MG CAPS Take 1,000 mg by mouth 2 (two) times daily.   Yes [provider]  omeprazole (PRILOSEC OTC) 20 MG tablet Take 20 mg by mouth daily.   Yes [provider]  oxcarbazepine (TRILEPTAL) 600 MG tablet Take 600 mg by mouth 2 (two) times daily.   Yes [provider]  oxyCODONE-acetaminophen (PERCOCET/ROXICET) 5-325 MG tablet Take 1 tablet by mouth every 6 (six) hours as needed for moderate pain or severe pain. 05/01/16  Yes Enid Baas, MD  phenytoin (DILANTIN) 100 MG ER capsule Take 100-300 mg by mouth See admin instructions. Take 1 capsule  by mouth at 6 am, and take 3 capsules (300 mg) by mouth every night at bedtime at 8 pm.   Yes [provider]  QUEtiapine (SEROQUEL XR) 300 MG 24 hr tablet Take 300 mg by mouth 3 (three) times daily.    Yes [provider]  senna-docusate (SENOKOT-S) 8.6-50 MG tablet Take 1 tablet by mouth daily. 05/01/16  Yes Enid Baas, MD  urea (CARMOL) 40 % CREA Apply 1 application topically daily.   Yes [provider]  Nutritional Supplements (FEEDING SUPPLEMENT, NEPRO CARB STEADY,) LIQD Take 237 mLs by mouth daily. 05/01/16   Enid Baas, MD  Nutritional Supplements (NEPRO) LIQD Take 240 mLs by mouth daily.    [provider]    Family History  Problem Relation Age of Onset  . Congestive Heart Failure Father      Social History   Tobacco Use  . Smoking status: Never Smoker  . Smokeless tobacco: Never Used  Substance Use Topics  . Alcohol use: No  . Drug use: No    Allergies as of 08/13/2018  . (No Known Allergies)    Review of Systems:    All systems reviewed and negative except where noted in HPI.   Physical Exam:  Vital signs in last 24 hours: Temp:  [97.6 F (36.4 C)-97.7 F (36.5 C)] 97.7 F (36.5 C) (10/18 0931) Pulse Rate:  [83-103] 102 (10/18 0931) Resp:  [14-21] 20 (10/18 0931) BP: (112-196)/(62-105) 136/82 (10/18 0931) SpO2:  [98 %-100 %] 99 % (10/18 0931) Weight:  [95.3 kg] 95.3 kg (10/18 0127)   General:   Pleasant, cooperative in NAD Head:  Normocephalic and atraumatic. Eyes:   No icterus.   Conjunctiva pink. PERRLA. Ears:  Normal auditory acuity. Neck:  Supple; no masses or thyroidomegaly Lungs: Respirations even and unlabored. Lungs clear to auscultation bilaterally.   No wheezes, crackles, or rhonchi.  Heart:  Regular rate and rhythm;  Without murmur, clicks, rubs or gallops Abdomen:  Soft, nondistended, nontender. Normal bowel sounds. No appreciable masses or hepatomegaly.  No rebound or guarding.  No  scars Neurologic:  Alert and oriented x2;  grossly normal neurologically. Skin:  Intact without significant lesions or rashes. Cervical Nodes:  No significant cervical adenopathy. Psych:  Alert and cooperative.  LAB RESULTS: Recent Labs    08/13/18 0202  WBC 6.8  HGB 11.8*  HCT 35.4*  PLT 146*   BMET Recent Labs    08/13/18 0202  NA 137  K 3.5  CL 89*  CO2 34*  GLUCOSE 129*  BUN 34*  CREATININE 5.78*  CALCIUM 9.5   LFT Recent Labs    08/13/18 0202  PROT 7.6  ALBUMIN 3.7  AST 18  ALT 12  ALKPHOS 136*  BILITOT 0.5   PT/INR No results for input(s): LABPROT, INR in the last 72 hours.  STUDIES: Ct Abdomen Pelvis Wo Contrast  Result Date: 08/13/2018 CLINICAL DATA:  Coffee ground emesis. Nausea and vomiting for 3 days. Abdominal infection. EXAM: CT ABDOMEN AND PELVIS WITHOUT CONTRAST TECHNIQUE: Multidetector CT imaging of the abdomen and pelvis was performed following the standard protocol without IV contrast. IV contrast attempted, however IV infiltrate of 18 mL Isovue-300, no IV contrast administered. COMPARISON:  CT 10/17/2013 FINDINGS: Lower chest: Ground-glass nodular opacities in the right lower lobe. Esophagus is distended and fluid-filled. There is ill-defined esophageal wall thickening. Pacemaker wires are partially included. Hepatobiliary: No focal hepatic lesion on noncontrast exam. Gallbladder partially distended. No calcified gallstone. No biliary dilatation. Pancreas: Partially obscured due to adjacent bowel and motion, no ductal dilatation or obvious inflammatory change. Spleen: Normal in size without focal abnormality. Adrenals/Urinary Tract: No adrenal nodule. Bilateral renal parenchymal atrophy. Bilateral renal cysts including small hyperdense cyst in the left kidney, similar to prior exam. No hydronephrosis. Urinary bladder near completely decompressed. Stomach/Bowel: Distended distal esophagus which is fluid-filled. Mild distal esophageal wall thickening.  Fluid-filled distended stomach. Proximal small bowel is dilated and fluid-filled. There is general transition from dilated to nondilated small bowel in the lower abdomen without abrupt transition point. Mild small bowel wall thickening in the region of transition, images 56-58 series 2. More distal small bowel is decompressed. Appendix not confidently visualized. Moderate stool burden throughout the colon. No colonic inflammation. Vascular/Lymphatic: Aortic and branch atherosclerosis. No aneurysm. Lack of contrast and motion artifact limits assessment for adenopathy, no bulky adenopathy is seen. Reproductive: Prostate is unremarkable. Other: No free air or ascites. Musculoskeletal: Sequela of renal osteodystrophy with  diffuse osseous sclerosis and endplate changes in the lumbar spine. No acute osseous abnormalities. IMPRESSION: 1. Dilated fluid-filled distal esophagus, stomach, and proximal and mid small bowel. Gradual small bowel transition in the pelvis were there is short/moderate length small bowel wall thickening. Findings suggest partial small bowel obstruction in the region of small bowel thickening. Infectious, inflammatory, as well as neoplastic etiologies are considered for cause of wall thickening and obstruction. 2. Ill-defined esophageal wall thickening, favor reflux or esophagitis. 3. Nodular and ground-glass opacities in the right lower lobe, suspicious for aspiration in the setting. 4.  Aortic Atherosclerosis (ICD10-I70.0). Electronically Signed   By: Narda Rutherford M.D.   On: 08/13/2018 04:20   Dg Chest Port 1 View  Result Date: 08/13/2018 CLINICAL DATA:  Shortness of breath EXAM: PORTABLE CHEST 1 VIEW COMPARISON:  09/23/2017 FINDINGS: Postsurgical changes at the right thoracic inlet. Multiple vascular stents in the right axillary region. Left cardiac pacing device as before. Stable cardiomegaly. No overt edema or pleural effusion. No focal airspace disease. Dilated small bowel in the upper  abdomen. IMPRESSION: 1. Cardiomegaly without acute opacity 2. Dilated small bowel in the upper abdomen, suggest dedicated abdominal radiographs. Electronically Signed   By: Jasmine Pang M.D.   On: 08/13/2018 01:44      Impression / Plan:   [name] Wiley is a 67 y.o. y/o male with ESRD admitted with acute onset vomiting/hematemesis.  Hemoglobin stable at baseline.  On admission CT scan of the abdomen shows fluid-filled distended esophagus stomach small bowel with a transition point.  No clear obstruction defined at the transition point on the CT scan.  But the area of the bowel distal to the transition area is collapsed.    The differentials for this could include a stricture versus adhesions versus small bowel tumors.  Irrespective of the etiology at this point of time with fluid-filled in the proximal GI tract, I would suggest NG tube with decompression.    Once the GI tract has been decompressed of its contents confirmed by an x-ray, I would suggest to obtain a either a CT or an MR enterogram to rule out any space-occupying lesions versus strictures of the small bowel.  An Enterogram  is also sensitive to pick up any inflammation from conditions such as IBD.  Although this condition is likely on my differentials.  In addition normalize electrolytes, limit opioid use.  Keep head end of the bed elevated at 45 degrees to prevent aspiration.  I am less likely concern of for an upper GI bleed as a hemoglobin has been stable and is possible that the patient has esophagitis.  I would not perform an endoscopy at this point of time with large quantity of fluid present in the upper GI tract.  I do note that the patient is being followed by surgery, a gastric emptying study would not change management at this point of time as obstruction is further downstream and hence even if performed a gastric emptying study would be positive at this point of time as the contents of the stomach are not emptying into the small  bowel as evidenced by the CT scan.  Suggest IV PPI, suppository for constipation and do not feed at this point of time.    Thank you for involving me in the care of this patient.      LOS: 0 days   [name] Mood, MD  08/13/2018, 10:44 AM

## 2018-08-13 NOTE — Progress Notes (Signed)
Per Dr. Nemiah Commander, Do not give PO meds today

## 2018-08-13 NOTE — NC FL2 (Addendum)
[place] MEDICAID FL2 LEVEL OF CARE SCREENING TOOL     IDENTIFICATION  Patient Name: [name] Wiley Birthdate: September 08, 1951 Sex: male Admission Date (Current Location): 08/13/2018  [facility] and [place] Number:  [address] ([id number]) Facility and Address:  [facility], [address] [place], [place] [address]      Provider Number: [id number]  Attending Physician Name and Address:  Enid Baas, MD  Relative Name and Phone Number:       Current Level of Care: Hospital Recommended Level of Care: Skilled Nursing Facility Prior Approval Number:    Date Approved/Denied:   PASRR Number: [id number])  Discharge Plan: SNF    Current Diagnoses: Patient Active Problem List   Diagnosis Date Noted  . SBO (small bowel obstruction) (HCC) 08/13/2018  . 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 Hyperkalemia 04/26/2016  . Hyponatremia 04/26/2016  . Pancytopenia (HCC) 04/26/2016  . End stage renal disease (HCC) 04/26/2016  . 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 )  AMBULATORY STATUS COMMUNICATION OF NEEDS Skin   Extensive Assist Verbally Normal                       Personal Care Assistance Level of Assistance  Bathing, Feeding, Dressing Bathing Assistance: Limited assistance Feeding assistance: Independent Dressing Assistance: Limited assistance     Functional Limitations Info  Sight, Hearing, Speech Sight Info: Adequate Hearing Info: Adequate Speech Info: Adequate    SPECIAL CARE FACTORS FREQUENCY   Dialysis                     Contractures      Additional Factors Info  Code Status, Allergies Code Status Info: (Full Code. ) Allergies Info: (No Known Allergies. )            Current Medications (08/13/2018):  This is the current hospital active medication list Current Facility-Administered Medications  Medication Dose Route Frequency Provider Last Rate Last Dose  . acetaminophen (TYLENOL) tablet 650 mg  650 mg Oral Q6H PRN Arnaldo Natal, MD       Or  . acetaminophen (TYLENOL) suppository 650 mg  650 mg Rectal Q6H PRN Arnaldo Natal, MD      . aspirin chewable tablet 81 mg  81 mg Oral QPM Arnaldo Natal, MD      . benztropine (COGENTIN) tablet 1 mg  1 mg Oral BID Arnaldo Natal, MD      . calcium acetate (PHOSLO) capsule 1,334 mg  1,334 mg Oral TID WC Arnaldo Natal, MD      . cholecalciferol (VITAMIN D) tablet 1,000 Units  1,000 Units Oral Daily Arnaldo Natal, MD      . cinacalcet [facility]) tablet 90 mg  90 mg Oral Once per day on Mon Wed Fri Arnaldo Natal, MD      . clonazePAM [facility]) tablet 0.5 mg  0.5 mg Oral BID Arnaldo Natal, MD      . docusate sodium (COLACE) capsule 100 mg  100 mg Oral BID Arnaldo Natal, MD      . feeding supplement (NEPRO CARB STEADY) liquid 237 mL  237 mL Oral Daily Arnaldo Natal, MD      .  Ferrous Fumarate (HEMOCYTE - 106 mg FE) tablet 106 mg of iron  1 tablet Oral Daily Arnaldo Natal, MD      . folic acid (FOLVITE) tablet 0.5 mg  500 mcg Oral Daily Arnaldo Natal, MD      . guaiFENesin-dextromethorphan (ROBITUSSIN DM) 100-10 MG/5ML syrup 10 mL  10 mL Oral Q4H PRN Arnaldo Natal, MD      . haloperidol (HALDOL) tablet 5 mg  5 mg Oral Daily PRN Arnaldo Natal, MD      . haloperidol (HALDOL) tablet 5 mg  5 mg Oral Once per day on Mon Wed Fri Arnaldo Natal, MD      . levothyroxine (SYNTHROID, LEVOTHROID) tablet 300 mcg  300 mcg Oral BID Arnaldo Natal, MD      . metolazone (ZAROXOLYN) tablet 2.5 mg  2.5 mg Oral Daily Arnaldo Natal, MD      . multivitamin (RENA-VIT) tablet 1 tablet  1 tablet Oral Daily Arnaldo Natal, MD      . NEPRO LIQD 240 mL  240  mL Oral Daily Arnaldo Natal, MD      . ondansetron [facility]) tablet 4 mg  4 mg Oral Q6H PRN Arnaldo Natal, MD       Or  . ondansetron [facility]) injection 4 mg  4 mg Intravenous Q6H PRN Arnaldo Natal, MD      . Oxcarbazepine (TRILEPTAL) tablet 600 mg  600 mg Oral BID Arnaldo Natal, MD      . oxyCODONE-acetaminophen (PERCOCET/ROXICET) 5-325 MG per tablet 1 tablet  1 tablet Oral Q6H PRN Arnaldo Natal, MD      . pantoprazole (PROTONIX) EC tablet 40 mg  40 mg Oral Daily Arnaldo Natal, MD      . phenytoin (DILANTIN) ER capsule 100 mg  100 mg Oral Dareen Piano, [place], MD      . phenytoin (DILANTIN) ER capsule 300 mg  300 mg Oral QHS Enid Baas, MD      . QUEtiapine (SEROQUEL XR) 24 hr tablet 300 mg  300 mg Oral TID Arnaldo Natal, MD      . senna-docusate (Senokot-S) tablet 1 tablet  1 tablet Oral Daily Arnaldo Natal, MD         Discharge Medications: Please see discharge summary for a list of discharge medications.  Relevant Imaging Results:  Relevant Lab Results:   Additional Information (SSN: [id number])  Dajanay Northrup, Darleen Crocker, LCSW

## 2018-08-13 NOTE — ED Notes (Signed)
Date and time results received: 08/13/18 0307   Test: Trop Critical Value: 0.04  Name of Provider Notified: Rifenbark  Orders Received? Or Actions Taken?: Acknowledged.

## 2018-08-13 NOTE — Plan of Care (Signed)

## 2018-08-13 NOTE — Progress Notes (Signed)
[facility] - [place] at [facility]   PATIENT NAME: [name] Wiley    MR#:  [id number]  DATE OF BIRTH:  [birth year]  SUBJECTIVE:  CHIEF COMPLAINT:   Chief Complaint  Patient presents with  . Hematemesis   -Developmental delay, unable to provide history.  Discussed with brother. -Admitted with nausea vomiting and hematemesis.  Abdominal distention noted, has an NG tube at this time.  REVIEW OF SYSTEMS:  Review of Systems  Unable to perform ROS: Psychiatric disorder    DRUG ALLERGIES:  No Known Allergies  VITALS:  Blood pressure 136/82, pulse (!) 102, temperature 97.7 F (36.5 C), temperature source Oral, resp. rate 20, height 5\' 6"  (1.676 m), weight 95.3 kg, SpO2 99 %.  PHYSICAL EXAMINATION:  Physical Exam   GENERAL:  67 y.o.-year-old patient lying in the bed with no acute distress.  EYES: Pupils equal, round, reactive to light and accommodation. No scleral icterus. Extraocular muscles intact.  HEENT: Head atraumatic, normocephalic. Oropharynx and nasopharynx clear.  NECK:  Supple, no jugular venous distention. No thyroid enlargement, no tenderness.  LUNGS: Normal breath sounds bilaterally, no wheezing, rales,rhonchi or crepitation. No use of accessory muscles of respiration. Decreased bibasilar breath sounds CARDIOVASCULAR: S1, S2 normal. No  rubs, or gallops. 2/6 systolic murmur present ABDOMEN: Soft, nontender,  But distended. Bowel sounds present. No organomegaly or mass.  EXTREMITIES: No pedal edema, cyanosis, or clubbing. Right arm AV fistula present NEUROLOGIC: Cranial nerves II through XII are intact. Follows commands. Muscle strength 5/5 in all extremities. Sensation intact. Gait not checked.  PSYCHIATRIC: The patient is alert   SKIN: No obvious rash, lesion, or ulcer.    LABORATORY PANEL:   CBC Recent Labs  Lab 08/13/18 0202 08/13/18 1044  WBC 6.8  --   HGB 11.8* 12.5*  HCT 35.4*  --   PLT 146*  --     ------------------------------------------------------------------------------------------------------------------  Chemistries  Recent Labs  Lab 08/13/18 0202  NA 137  K 3.5  CL 89*  CO2 34*  GLUCOSE 129*  BUN 34*  CREATININE 5.78*  CALCIUM 9.5  AST 18  ALT 12  ALKPHOS 136*  BILITOT 0.5   ------------------------------------------------------------------------------------------------------------------  Cardiac Enzymes Recent Labs  Lab 08/13/18 0202  TROPONINI 0.04*   ------------------------------------------------------------------------------------------------------------------  RADIOLOGY:  Ct Abdomen Pelvis Wo Contrast  Result Date: 08/13/2018 CLINICAL DATA:  Coffee ground emesis. Nausea and vomiting for 3 days. Abdominal infection. EXAM: CT ABDOMEN AND PELVIS WITHOUT CONTRAST TECHNIQUE: Multidetector CT imaging of the abdomen and pelvis was performed following the standard protocol without IV contrast. IV contrast attempted, however IV infiltrate of 18 mL Isovue-300, no IV contrast administered. COMPARISON:  CT 10/17/2013 FINDINGS: Lower chest: Ground-glass nodular opacities in the right lower lobe. Esophagus is distended and fluid-filled. There is ill-defined esophageal wall thickening. Pacemaker wires are partially included. Hepatobiliary: No focal hepatic lesion on noncontrast exam. Gallbladder partially distended. No calcified gallstone. No biliary dilatation. Pancreas: Partially obscured due to adjacent bowel and motion, no ductal dilatation or obvious inflammatory change. Spleen: Normal in size without focal abnormality. Adrenals/Urinary Tract: No adrenal nodule. Bilateral renal parenchymal atrophy. Bilateral renal cysts including small hyperdense cyst in the left kidney, similar to prior exam. No hydronephrosis. Urinary bladder near completely decompressed. Stomach/Bowel: Distended distal esophagus which is fluid-filled. Mild distal esophageal wall thickening.  Fluid-filled distended stomach. Proximal small bowel is dilated and fluid-filled. There is general transition from dilated to nondilated small bowel in the lower abdomen without abrupt transition point. Mild small bowel wall  thickening in the region of transition, images 56-58 series 2. More distal small bowel is decompressed. Appendix not confidently visualized. Moderate stool burden throughout the colon. No colonic inflammation. Vascular/Lymphatic: Aortic and branch atherosclerosis. No aneurysm. Lack of contrast and motion artifact limits assessment for adenopathy, no bulky adenopathy is seen. Reproductive: Prostate is unremarkable. Other: No free air or ascites. Musculoskeletal: Sequela of renal osteodystrophy with diffuse osseous sclerosis and endplate changes in the lumbar spine. No acute osseous abnormalities. IMPRESSION: 1. Dilated fluid-filled distal esophagus, stomach, and proximal and mid small bowel. Gradual small bowel transition in the pelvis were there is short/moderate length small bowel wall thickening. Findings suggest partial small bowel obstruction in the region of small bowel thickening. Infectious, inflammatory, as well as neoplastic etiologies are considered for cause of wall thickening and obstruction. 2. Ill-defined esophageal wall thickening, favor reflux or esophagitis. 3. Nodular and ground-glass opacities in the right lower lobe, suspicious for aspiration in the setting. 4.  Aortic Atherosclerosis (ICD10-I70.0). Electronically Signed   By: Narda Rutherford M.D.   On: 08/13/2018 04:20   Dg Chest Port 1 View  Result Date: 08/13/2018 CLINICAL DATA:  Shortness of breath EXAM: PORTABLE CHEST 1 VIEW COMPARISON:  09/23/2017 FINDINGS: Postsurgical changes at the right thoracic inlet. Multiple vascular stents in the right axillary region. Left cardiac pacing device as before. Stable cardiomegaly. No overt edema or pleural effusion. No focal airspace disease. Dilated small bowel in the upper  abdomen. IMPRESSION: 1. Cardiomegaly without acute opacity 2. Dilated small bowel in the upper abdomen, suggest dedicated abdominal radiographs. Electronically Signed   By: Jasmine Pang M.D.   On: 08/13/2018 01:44    EKG:   Orders placed or performed during the hospital encounter of 08/13/18  . ED EKG  . ED EKG  . EKG 12-Lead  . EKG 12-Lead    ASSESSMENT AND PLAN:   67 year old male with past medical history significant for end-stage renal disease on Tuesday, Thursday and Saturday hemodialysis, seizure disorder, intellectual disability, CAD and hypertension presents from nursing home secondary to coffee-ground emesis and nausea vomiting  1.  Small bowel obstruction-presented with nausea and vomiting and CT showing small bowel obstruction. -No mechanical obstruction noted. -Appreciate surgical consult.  NG tube in place, continue decompression and hold oral medications today. -Follow-up KUB in a.m. -Prior abdominal surgeries noted. - monitor electrolytes  2.  Coffee-ground emesis-hemoglobin is stable.  Appreciate GI consult.  No plan for any procedures at this time -Continue to trend hemoglobin.  IV Protonix for now  3.  End-stage renal disease-Tuesday Thursday Saturday hemodialysis.  Appreciate nephrology consult.  For dialysis tomorrow per schedule  4.  Schizophrenia-oral meds are on hold today.  Likely can be restarted tomorrow  5.  Hypothyroidism-Synthroid on hold today  6.  DVT prophylaxis-we will hold heparin products, in light of GI bleed  Patient ambulates with a walker at baseline.  Physical therapy consult tomorrow. Updated brother Mr. Gardiner Barefoot over the phone    All the records are reviewed and case discussed with Care Management/Social Workerr. Management plans discussed with the patient, family and they are in agreement.  CODE STATUS: Full Code  TOTAL TIME TAKING CARE OF THIS PATIENT: 38 minutes.   POSSIBLE D/C IN 2-3 DAYS, DEPENDING ON CLINICAL  CONDITION.   Enid Baas M.D on 08/13/2018 at 3:32 PM  Between 7am to 6pm - Pager - [phone]  After 6pm go to [email or web address] - Social research officer, government  [facility]  Office  [phone]  CC: Primary  care physician; Clinic, General Medical

## 2018-08-13 NOTE — ED Notes (Signed)
Patient has ready bed, awaiting call back from admitting MD to address HTN.

## 2018-08-13 NOTE — ED Triage Notes (Signed)
Patient to ER via [facility] from [facility] for c/o hematemesis. Per staff, patient has had N/V x3 days, had large projectile emesis episode with "coffee ground emesis" tonight. Patient states he feels fine now.

## 2018-08-14 LAB — HEMOGLOBIN
HEMOGLOBIN: 11.3 g/dL — AB (ref 13.0–17.0)
Hemoglobin: 11.2 g/dL — ABNORMAL LOW (ref 13.0–17.0)

## 2018-08-14 LAB — CBC
HEMATOCRIT: 30.8 % — AB (ref 39.0–52.0)
HEMOGLOBIN: 10.4 g/dL — AB (ref 13.0–17.0)
MCH: 32.4 pg (ref 26.0–34.0)
MCHC: 33.8 g/dL (ref 30.0–36.0)
MCV: 96 fL (ref 80.0–100.0)
Platelets: 147 10*3/uL — ABNORMAL LOW (ref 150–400)
RBC: 3.21 MIL/uL — ABNORMAL LOW (ref 4.22–5.81)
RDW: 16.5 % — ABNORMAL HIGH (ref 11.5–15.5)
WBC: 7.8 10*3/uL (ref 4.0–10.5)
nRBC: 0 % (ref 0.0–0.2)

## 2018-08-14 LAB — RENAL FUNCTION PANEL
ALBUMIN: 3.1 g/dL — AB (ref 3.5–5.0)
Anion gap: 24 — ABNORMAL HIGH (ref 5–15)
BUN: 89 mg/dL — AB (ref 8–23)
CO2: 22 mmol/L (ref 22–32)
Calcium: 9.2 mg/dL (ref 8.9–10.3)
Chloride: 91 mmol/L — ABNORMAL LOW (ref 98–111)
Creatinine, Ser: 9.29 mg/dL — ABNORMAL HIGH (ref 0.61–1.24)
GFR calc Af Amer: 6 mL/min — ABNORMAL LOW (ref >60)
GFR calc non Af Amer: 5 mL/min — ABNORMAL LOW (ref >60)
GLUCOSE: 82 mg/dL (ref 70–99)
PHOSPHORUS: 7.6 mg/dL — AB (ref 2.5–4.6)
Potassium: 4.4 mmol/L (ref 3.5–5.1)
Sodium: 137 mmol/L (ref 135–145)

## 2018-08-14 LAB — MRSA PCR SCREENING: MRSA by PCR: NEGATIVE

## 2018-08-14 MED ORDER — BISACODYL 10 MG RE SUPP
10.0000 mg | Freq: Once | RECTAL | Status: AC
  Administered 2018-08-14: 10 mg via RECTAL
  Filled 2018-08-14: qty 1

## 2018-08-14 NOTE — Progress Notes (Signed)
This note also relates to the following rows which could not be included: Pulse Rate - Cannot attach notes to unvalidated device data Resp - Cannot attach notes to unvalidated device data BP - Cannot attach notes to unvalidated device data  Hd started  

## 2018-08-14 NOTE — Progress Notes (Signed)
[facility] - [place] at [facility]   PATIENT NAME: [name] Wiley    MR#:  [id number]  DATE OF BIRTH:  [birth year]  SUBJECTIVE:  CHIEF COMPLAINT:   Chief Complaint  Patient presents with  . Hematemesis   -Developmental delay, unable to provide history.    - still has NG tube and output slowly decreasing - for dialysis today  REVIEW OF SYSTEMS:  Review of Systems  Unable to perform ROS: Psychiatric disorder    DRUG ALLERGIES:  No Known Allergies  VITALS:  Blood pressure (!) 110/53, pulse 89, temperature 98.6 F (37 C), temperature source Axillary, resp. rate 20, height 5\' 6"  (1.676 m), weight 89.9 kg, SpO2 97 %.  PHYSICAL EXAMINATION:  Physical Exam   GENERAL:  67 y.o.-year-old patient lying in the bed with no acute distress.  EYES: Pupils equal, round, reactive to light and accommodation. No scleral icterus. Extraocular muscles intact.  HEENT: Head atraumatic, normocephalic. Oropharynx and nasopharynx clear.  NECK:  Supple, no jugular venous distention. No thyroid enlargement, no tenderness.  LUNGS: Normal breath sounds bilaterally, no wheezing, rales,rhonchi or crepitation. No use of accessory muscles of respiration. Decreased bibasilar breath sounds CARDIOVASCULAR: S1, S2 normal. No  rubs, or gallops. 2/6 systolic murmur present ABDOMEN: Soft, nontender, less distended. Hypoactive Bowel sounds present. No organomegaly or mass.  EXTREMITIES: No pedal edema, cyanosis, or clubbing. Right arm AV fistula present NEUROLOGIC: Cranial nerves II through XII are intact. Follows commands. Muscle strength 5/5 in all extremities. Sensation intact. Gait not checked.  PSYCHIATRIC: The patient is alert   SKIN: No obvious rash, lesion, or ulcer.    LABORATORY PANEL:   CBC Recent Labs  Lab 08/13/18 0202  08/14/18 0820  WBC 6.8  --   --   HGB 11.8*   < > 11.2*  HCT 35.4*  --   --   PLT 146*  --   --    < > = values in this interval not displayed.    ------------------------------------------------------------------------------------------------------------------  Chemistries  Recent Labs  Lab 08/13/18 0202  NA 137  K 3.5  CL 89*  CO2 34*  GLUCOSE 129*  BUN 34*  CREATININE 5.78*  CALCIUM 9.5  AST 18  ALT 12  ALKPHOS 136*  BILITOT 0.5   ------------------------------------------------------------------------------------------------------------------  Cardiac Enzymes Recent Labs  Lab 08/13/18 0202  TROPONINI 0.04*   ------------------------------------------------------------------------------------------------------------------  RADIOLOGY:  Ct Abdomen Pelvis Wo Contrast  Result Date: 08/13/2018 CLINICAL DATA:  Coffee ground emesis. Nausea and vomiting for 3 days. Abdominal infection. EXAM: CT ABDOMEN AND PELVIS WITHOUT CONTRAST TECHNIQUE: Multidetector CT imaging of the abdomen and pelvis was performed following the standard protocol without IV contrast. IV contrast attempted, however IV infiltrate of 18 mL Isovue-300, no IV contrast administered. COMPARISON:  CT 10/17/2013 FINDINGS: Lower chest: Ground-glass nodular opacities in the right lower lobe. Esophagus is distended and fluid-filled. There is ill-defined esophageal wall thickening. Pacemaker wires are partially included. Hepatobiliary: No focal hepatic lesion on noncontrast exam. Gallbladder partially distended. No calcified gallstone. No biliary dilatation. Pancreas: Partially obscured due to adjacent bowel and motion, no ductal dilatation or obvious inflammatory change. Spleen: Normal in size without focal abnormality. Adrenals/Urinary Tract: No adrenal nodule. Bilateral renal parenchymal atrophy. Bilateral renal cysts including small hyperdense cyst in the left kidney, similar to prior exam. No hydronephrosis. Urinary bladder near completely decompressed. Stomach/Bowel: Distended distal esophagus which is fluid-filled. Mild distal esophageal wall thickening.  Fluid-filled distended stomach. Proximal small bowel is dilated and fluid-filled. There is  general transition from dilated to nondilated small bowel in the lower abdomen without abrupt transition point. Mild small bowel wall thickening in the region of transition, images 56-58 series 2. More distal small bowel is decompressed. Appendix not confidently visualized. Moderate stool burden throughout the colon. No colonic inflammation. Vascular/Lymphatic: Aortic and branch atherosclerosis. No aneurysm. Lack of contrast and motion artifact limits assessment for adenopathy, no bulky adenopathy is seen. Reproductive: Prostate is unremarkable. Other: No free air or ascites. Musculoskeletal: Sequela of renal osteodystrophy with diffuse osseous sclerosis and endplate changes in the lumbar spine. No acute osseous abnormalities. IMPRESSION: 1. Dilated fluid-filled distal esophagus, stomach, and proximal and mid small bowel. Gradual small bowel transition in the pelvis were there is short/moderate length small bowel wall thickening. Findings suggest partial small bowel obstruction in the region of small bowel thickening. Infectious, inflammatory, as well as neoplastic etiologies are considered for cause of wall thickening and obstruction. 2. Ill-defined esophageal wall thickening, favor reflux or esophagitis. 3. Nodular and ground-glass opacities in the right lower lobe, suspicious for aspiration in the setting. 4.  Aortic Atherosclerosis (ICD10-I70.0). Electronically Signed   By: Narda Rutherford M.D.   On: 08/13/2018 04:20   Dg Abd 1 View  Result Date: 08/13/2018 CLINICAL DATA:  Small bowel obstruction. EXAM: ABDOMEN - 1 VIEW COMPARISON:  CT abdomen pelvis from same day. FINDINGS: Nasogastric tube coiled in the stomach, with the tip in the gastric fundus. Multiple dilated loops of small bowel are unchanged. Large amount of stool in the right colon. Stool also seen within the rectum. No acute osseous abnormality.  IMPRESSION: Unchanged small bowel obstruction. Electronically Signed   By: Obie Dredge M.D.   On: 08/13/2018 16:34   Dg Chest Port 1 View  Result Date: 08/13/2018 CLINICAL DATA:  Shortness of breath EXAM: PORTABLE CHEST 1 VIEW COMPARISON:  09/23/2017 FINDINGS: Postsurgical changes at the right thoracic inlet. Multiple vascular stents in the right axillary region. Left cardiac pacing device as before. Stable cardiomegaly. No overt edema or pleural effusion. No focal airspace disease. Dilated small bowel in the upper abdomen. IMPRESSION: 1. Cardiomegaly without acute opacity 2. Dilated small bowel in the upper abdomen, suggest dedicated abdominal radiographs. Electronically Signed   By: Jasmine Pang M.D.   On: 08/13/2018 01:44    EKG:   Orders placed or performed during the hospital encounter of 08/13/18  . ED EKG  . ED EKG  . EKG 12-Lead  . EKG 12-Lead    ASSESSMENT AND PLAN:   67 year old male with past medical history significant for end-stage renal disease on Tuesday, Thursday and Saturday hemodialysis, seizure disorder, intellectual disability, CAD and hypertension presents from nursing home secondary to coffee-ground emesis and nausea vomiting  1.  Small bowel obstruction-presented with nausea and vomiting and CT showing small bowel obstruction. -No mechanical obstruction noted. Has chronic constipation issues - KUB with with still stool in colon and rectum- suppository once and tap water enema once prn -Appreciate surgical consult.  NG tube in place, continue decompression and hold oral medications. -Follow-up KUB in a.m. -Prior abdominal surgeries noted. - monitor electrolytes  2.  Coffee-ground emesis-hemoglobin is stable.  Appreciate GI consult.  No plan for any procedures at this time -Continue to trend hemoglobin.  IV Protonix for now - EGD once stable  3.  End-stage renal disease-Tuesday Thursday Saturday hemodialysis.  Appreciate nephrology consult.  For dialysis  today per schedule  4.  Schizophrenia-oral meds are on hold today.  Likely can be restarted tomorrow  5.  Hypothyroidism-Synthroid on hold now  6.  DVT prophylaxis-we will hold heparin products, in light of GI bleed  Patient ambulates with a walker at baseline.  Physical therapy consult.      All the records are reviewed and case discussed with Care Management/Social Workerr. Management plans discussed with the patient, family and they are in agreement.  CODE STATUS: Full Code  TOTAL TIME TAKING CARE OF THIS PATIENT: 36 minutes.   POSSIBLE D/C IN 2-3 DAYS, DEPENDING ON CLINICAL CONDITION.   Enid Baas M.D on 08/14/2018 at 2:04 PM  Between 7am to 6pm - Pager - [phone]  After 6pm go to [email or web address] - Social research officer, government  [facility]  Office  [phone]  CC: Primary care physician; Clinic, General Medical

## 2018-08-14 NOTE — Progress Notes (Signed)
Pt off floor for dialysis. Suppository held per pt request and per dialysis request until pt returns to floor.

## 2018-08-14 NOTE — Progress Notes (Signed)
SURGICAL PROGRESS NOTE   Hospital Day(s): 1.   Post op day(s):  Marland Kitchen   Interval History: Patient seen and examined, no acute events or new complaints overnight. Patient unable to get history. No evidence of bowel movement or gas. Last stool occurrence charted on 08/13/18 at 1200.  Vital signs in last 24 hours: [min-max] current  Temp:  [97.9 F (36.6 C)-98.6 F (37 C)] 97.9 F (36.6 C) (10/19 1555) Pulse Rate:  [87-96] 87 (10/19 1600) Resp:  [18-20] 18 (10/19 1600) BP: (101-113)/(50-86) 101/53 (10/19 1600) SpO2:  [96 %-98 %] 96 % (10/19 1555) Weight:  [89.9 kg] 89.9 kg (10/19 0500)     Height: 5\' 6"  (167.6 cm) Weight: 89.9 kg BMI (Calculated): 32    Physical Exam:  Constitutional: alert, cooperative Gastrointestinal: soft, non-tender, and non-distended  Labs:  CBC Latest Ref Rng & Units 08/14/2018 08/14/2018 08/14/2018  WBC 4.0 - 10.5 K/uL 7.8 - -  Hemoglobin 13.0 - 17.0 g/dL 10.4(L) 11.2(L) 11.3(L)  Hematocrit 39.0 - 52.0 % 30.8(L) - -  Platelets 150 - 400 K/uL 147(L) - -   CMP Latest Ref Rng & Units 08/13/2018 12/01/2017 11/30/2017  Glucose 70 - 99 mg/dL 161(W) - 91  BUN 8 - 23 mg/dL 96(E) - 454(U)  Creatinine 0.61 - 1.24 mg/dL 9.81(X) - 91.47(W)  Sodium 135 - 145 mmol/L 137 - 133(L)  Potassium 3.5 - 5.1 mmol/L 3.5 4.8 6.6(HH)  Chloride 98 - 111 mmol/L 89(L) - 92(L)  CO2 22 - 32 mmol/L 34(H) - 24  Calcium 8.9 - 10.3 mg/dL 9.5 - 8.7(L)  Total Protein 6.5 - 8.1 g/dL 7.6 - -  Total Bilirubin 0.3 - 1.2 mg/dL 0.5 - -  Alkaline Phos 38 - 126 U/L 136(H) - -  AST 15 - 41 U/L 18 - -  ALT 0 - 44 U/L 12 - -    Imaging studies: No new pertinent imaging studies   Assessment/Plan:  67 y.o. male with small bowel obstruction vs ileus and constipation, complicated by pertinent medical co-morbidities including ERSD on HD TThS, CAD, HTN, HLD, GERD, Hypothyroidism, and developmental delay.  No change on physical exam. Unable to say if patient has return of bowel function. No major  improvement of abdominal xray. Agree with Internal medicine to continue NGT Xray in the morning. Continue with medical management of medical condition.    Gae Gallop, MD

## 2018-08-14 NOTE — Progress Notes (Signed)
Pre dialysis assessment 

## 2018-08-14 NOTE — Progress Notes (Signed)
[facility], [place] 08/14/18  Subjective:   NGT to suction 800  Hemodialysis for later today.   Brother has many questions and concerns about care.   Objective:  Vital signs in last 24 hours:  Temp:  [98 F (36.7 C)-98.6 F (37 C)] 98.6 F (37 C) (10/19 0927) Pulse Rate:  [89-96] 89 (10/19 0927) Resp:  [18-20] 20 (10/19 0927) BP: (110-113)/(53-86) 110/53 (10/19 0927) SpO2:  [97 %-98 %] 97 % (10/19 0927) Weight:  [89.9 kg] 89.9 kg (10/19 0500)  Weight change: -5.355 kg Filed Weights   08/13/18 0127 08/14/18 0500  Weight: 95.3 kg 89.9 kg    Intake/Output:    Intake/Output Summary (Last 24 hours) at 08/14/2018 1310 Last data filed at 08/14/2018 1127 Gross per 24 hour  Intake -  Output 0 ml  Net 0 ml     Physical Exam: General:  Ill-appearing, laying in the bed  HEENT  NG tube in place  Neck  supple  Pulm/lungs  shallow breathing effort, no crackles  CVS/Heart  no rub  Abdomen:   Soft, nontender, nondistended  Extremities:  No edema  Neurologic:  Lethargic but able to follow simple commands  Skin:  Normal turgor  Access:  Forearm AV graft       Basic Metabolic Panel:  Recent Labs  Lab 08/13/18 0202  NA 137  K 3.5  CL 89*  CO2 34*  GLUCOSE 129*  BUN 34*  CREATININE 5.78*  CALCIUM 9.5     CBC: Recent Labs  Lab 08/13/18 0202 08/13/18 1044 08/13/18 1616 08/14/18 0009 08/14/18 0820  WBC 6.8  --   --   --   --   NEUTROABS 5.5  --   --   --   --   HGB 11.8* 12.5* 12.0* 11.3* 11.2*  HCT 35.4*  --   --   --   --   MCV 97.0  --   --   --   --   PLT 146*  --   --   --   --       Lab Results  Component Value Date   HEPBSAG Negative 11/27/2017      Microbiology:  Recent Results (from the past 240 hour(s))  MRSA PCR Screening     Status: None   Collection Time: 08/14/18  5:52 AM  Result Value Ref Range Status   MRSA by PCR NEGATIVE NEGATIVE Final    Comment:        The GeneXpert MRSA Assay (FDA approved  for NASAL specimens only), is one component of a comprehensive MRSA colonization surveillance program. It is not intended to diagnose MRSA infection nor to guide or monitor treatment for MRSA infections. Performed at [facility], [address] [place], [place] [address]     Coagulation Studies: No results for input(s): LABPROT, INR in the last 72 hours.  Urinalysis: No results for input(s): COLORURINE, LABSPEC, PHURINE, GLUCOSEU, HGBUR, BILIRUBINUR, KETONESUR, PROTEINUR, UROBILINOGEN, NITRITE, LEUKOCYTESUR in the last 72 hours.  Invalid input(s): APPERANCEUR    Imaging: Ct Abdomen Pelvis Wo Contrast  Result Date: 08/13/2018 CLINICAL DATA:  Coffee ground emesis. Nausea and vomiting for 3 days. Abdominal infection. EXAM: CT ABDOMEN AND PELVIS WITHOUT CONTRAST TECHNIQUE: Multidetector CT imaging of the abdomen and pelvis was performed following the standard protocol without IV contrast. IV contrast attempted, however IV infiltrate of 18 mL Isovue-300, no IV contrast administered. COMPARISON:  CT 10/17/2013 FINDINGS: Lower chest: Ground-glass nodular opacities in  the right lower lobe. Esophagus is distended and fluid-filled. There is ill-defined esophageal wall thickening. Pacemaker wires are partially included. Hepatobiliary: No focal hepatic lesion on noncontrast exam. Gallbladder partially distended. No calcified gallstone. No biliary dilatation. Pancreas: Partially obscured due to adjacent bowel and motion, no ductal dilatation or obvious inflammatory change. Spleen: Normal in size without focal abnormality. Adrenals/Urinary Tract: No adrenal nodule. Bilateral renal parenchymal atrophy. Bilateral renal cysts including small hyperdense cyst in the left kidney, similar to prior exam. No hydronephrosis. Urinary bladder near completely decompressed. Stomach/Bowel: Distended distal esophagus which is fluid-filled. Mild distal esophageal wall thickening. Fluid-filled distended  stomach. Proximal small bowel is dilated and fluid-filled. There is general transition from dilated to nondilated small bowel in the lower abdomen without abrupt transition point. Mild small bowel wall thickening in the region of transition, images 56-58 series 2. More distal small bowel is decompressed. Appendix not confidently visualized. Moderate stool burden throughout the colon. No colonic inflammation. Vascular/Lymphatic: Aortic and branch atherosclerosis. No aneurysm. Lack of contrast and motion artifact limits assessment for adenopathy, no bulky adenopathy is seen. Reproductive: Prostate is unremarkable. Other: No free air or ascites. Musculoskeletal: Sequela of renal osteodystrophy with diffuse osseous sclerosis and endplate changes in the lumbar spine. No acute osseous abnormalities. IMPRESSION: 1. Dilated fluid-filled distal esophagus, stomach, and proximal and mid small bowel. Gradual small bowel transition in the pelvis were there is short/moderate length small bowel wall thickening. Findings suggest partial small bowel obstruction in the region of small bowel thickening. Infectious, inflammatory, as well as neoplastic etiologies are considered for cause of wall thickening and obstruction. 2. Ill-defined esophageal wall thickening, favor reflux or esophagitis. 3. Nodular and ground-glass opacities in the right lower lobe, suspicious for aspiration in the setting. 4.  Aortic Atherosclerosis (ICD10-I70.0). Electronically Signed   By: Narda Rutherford M.D.   On: 08/13/2018 04:20   Dg Abd 1 View  Result Date: 08/13/2018 CLINICAL DATA:  Small bowel obstruction. EXAM: ABDOMEN - 1 VIEW COMPARISON:  CT abdomen pelvis from same day. FINDINGS: Nasogastric tube coiled in the stomach, with the tip in the gastric fundus. Multiple dilated loops of small bowel are unchanged. Large amount of stool in the right colon. Stool also seen within the rectum. No acute osseous abnormality. IMPRESSION: Unchanged small bowel  obstruction. Electronically Signed   By: Obie Dredge M.D.   On: 08/13/2018 16:34   Dg Chest Port 1 View  Result Date: 08/13/2018 CLINICAL DATA:  Shortness of breath EXAM: PORTABLE CHEST 1 VIEW COMPARISON:  09/23/2017 FINDINGS: Postsurgical changes at the right thoracic inlet. Multiple vascular stents in the right axillary region. Left cardiac pacing device as before. Stable cardiomegaly. No overt edema or pleural effusion. No focal airspace disease. Dilated small bowel in the upper abdomen. IMPRESSION: 1. Cardiomegaly without acute opacity 2. Dilated small bowel in the upper abdomen, suggest dedicated abdominal radiographs. Electronically Signed   By: Jasmine Pang M.D.   On: 08/13/2018 01:44     Medications:    . aspirin  81 mg Oral QPM  . benztropine  1 mg Oral BID  . Chlorhexidine Gluconate Cloth  6 each Topical Q0600  . cholecalciferol  1,000 Units Oral Daily  . clonazePAM  0.5 mg Oral BID  . docusate sodium  100 mg Oral BID  . feeding supplement (NEPRO CARB STEADY)  237 mL Oral Daily  . Ferrous Fumarate  1 tablet Oral Daily  . folic acid  500 mcg Oral Daily  . haloperidol  5 mg  Oral Once per day on Mon Wed Fri  . levothyroxine  300 mcg Oral BID  . metolazone  2.5 mg Oral Daily  . multivitamin  1 tablet Oral Daily  . NEPRO  237 mL Oral Daily  . oxcarbazepine  600 mg Oral BID  . pantoprazole (PROTONIX) IV  40 mg Intravenous Q12H  . phenytoin  100 mg Oral BH-q7a  . phenytoin  300 mg Oral QHS  . QUEtiapine  300 mg Oral TID  . senna-docusate  1 tablet Oral Daily   acetaminophen **OR** acetaminophen, guaiFENesin-dextromethorphan, haloperidol, ondansetron **OR** ondansetron (ZOFRAN) IV, oxyCODONE-acetaminophen  Assessment/ Plan:  67 y.o. African-American male with end-stage renal disease, hypertension, mild mental retardation, seizure disorder, hypothyroidism  [facility] / [address]/right arm AV graft/225 min/ TTS/95 KG  1.  End-stage renal disease: TTS. Treatment for  later today. Orders prepared.   2.  Anemia of chronic kidney disease Continue Epogen with hemodialysis  3.  Secondary hyperparathyroidism Hold phosphorus binders until he is able to eat normal diet  4.  Small bowel obstruction causing nausea, vomiting NG tube to suction Appreciate GI and surgery input.   5. Hypertension: blood pressure well controlled.   LOS: 1 Lamont Dowdy 10/19/20191:10 PM  [address] [place], [place] [phone]

## 2018-08-14 NOTE — Progress Notes (Addendum)
[name] Bouillon, MD [address], [place], [place], [address] [address], [address], [place], [place], [address] Phone: [phone]  Fax: [phone]   Subjective:  X-ray done yesterday shows stool in the right colon.  Nasogastric tube in the stomach, with multiple dilated loops of small bowel that are unchanged.  Objective: Exam: Vital signs in last 24 hours: Vitals:   08/13/18 0931 08/13/18 2353 08/14/18 0500 08/14/18 0927  BP: 136/82 113/86  (!) 110/53  Pulse: (!) 102 96  89  Resp: 20 18  20   Temp: 97.7 F (36.5 C) 98 F (36.7 C)  98.6 F (37 C)  TempSrc: Oral Oral  Axillary  SpO2: 99% 98%  97%  Weight:   89.9 kg   Height:       Weight change: -5.355 kg  Intake/Output Summary (Last 24 hours) at 08/14/2018 1006 Last data filed at 08/13/2018 1300 Gross per 24 hour  Intake -  Output 800 ml  Net -800 ml    General: No acute distress, AAO x3 Abd: Soft, NT/ND, No HSM Skin: Warm, no rashes Neck: Supple, Trachea midline   Lab Results: Lab Results  Component Value Date   WBC 6.8 08/13/2018   HGB 11.2 (L) 08/14/2018   HCT 35.4 (L) 08/13/2018   MCV 97.0 08/13/2018   PLT 146 (L) 08/13/2018   Micro Results: Recent Results (from the past 240 hour(s))  MRSA PCR Screening     Status: None   Collection Time: 08/14/18  5:52 AM  Result Value Ref Range Status   MRSA by PCR NEGATIVE NEGATIVE Final    Comment:        The GeneXpert MRSA Assay (FDA approved for NASAL specimens only), is one component of a comprehensive MRSA colonization surveillance program. It is not intended to diagnose MRSA infection nor to guide or monitor treatment for MRSA infections. Performed at [facility], [address] [place], [place] [address]    Studies/Results: Ct Abdomen Pelvis Wo Contrast  Result Date: 08/13/2018 CLINICAL DATA:  Coffee ground emesis. Nausea and vomiting for 3 days. Abdominal infection. EXAM: CT ABDOMEN AND PELVIS WITHOUT CONTRAST  TECHNIQUE: Multidetector CT imaging of the abdomen and pelvis was performed following the standard protocol without IV contrast. IV contrast attempted, however IV infiltrate of 18 mL Isovue-300, no IV contrast administered. COMPARISON:  CT 10/17/2013 FINDINGS: Lower chest: Ground-glass nodular opacities in the right lower lobe. Esophagus is distended and fluid-filled. There is ill-defined esophageal wall thickening. Pacemaker wires are partially included. Hepatobiliary: No focal hepatic lesion on noncontrast exam. Gallbladder partially distended. No calcified gallstone. No biliary dilatation. Pancreas: Partially obscured due to adjacent bowel and motion, no ductal dilatation or obvious inflammatory change. Spleen: Normal in size without focal abnormality. Adrenals/Urinary Tract: No adrenal nodule. Bilateral renal parenchymal atrophy. Bilateral renal cysts including small hyperdense cyst in the left kidney, similar to prior exam. No hydronephrosis. Urinary bladder near completely decompressed. Stomach/Bowel: Distended distal esophagus which is fluid-filled. Mild distal esophageal wall thickening. Fluid-filled distended stomach. Proximal small bowel is dilated and fluid-filled. There is general transition from dilated to nondilated small bowel in the lower abdomen without abrupt transition point. Mild small bowel wall thickening in the region of transition, images 56-58 series 2. More distal small bowel is decompressed. Appendix not confidently visualized. Moderate stool burden throughout the colon. No colonic inflammation. Vascular/Lymphatic: Aortic and branch atherosclerosis. No aneurysm. Lack of contrast and motion artifact limits assessment for adenopathy, no bulky adenopathy is seen. Reproductive: Prostate  is unremarkable. Other: No free air or ascites. Musculoskeletal: Sequela of renal osteodystrophy with diffuse osseous sclerosis and endplate changes in the lumbar spine. No acute osseous abnormalities.  IMPRESSION: 1. Dilated fluid-filled distal esophagus, stomach, and proximal and mid small bowel. Gradual small bowel transition in the pelvis were there is short/moderate length small bowel wall thickening. Findings suggest partial small bowel obstruction in the region of small bowel thickening. Infectious, inflammatory, as well as neoplastic etiologies are considered for cause of wall thickening and obstruction. 2. Ill-defined esophageal wall thickening, favor reflux or esophagitis. 3. Nodular and ground-glass opacities in the right lower lobe, suspicious for aspiration in the setting. 4.  Aortic Atherosclerosis (ICD10-I70.0). Electronically Signed   By: Narda Rutherford M.D.   On: 08/13/2018 04:20   Dg Abd 1 View  Result Date: 08/13/2018 CLINICAL DATA:  Small bowel obstruction. EXAM: ABDOMEN - 1 VIEW COMPARISON:  CT abdomen pelvis from same day. FINDINGS: Nasogastric tube coiled in the stomach, with the tip in the gastric fundus. Multiple dilated loops of small bowel are unchanged. Large amount of stool in the right colon. Stool also seen within the rectum. No acute osseous abnormality. IMPRESSION: Unchanged small bowel obstruction. Electronically Signed   By: Obie Dredge M.D.   On: 08/13/2018 16:34   Dg Chest Port 1 View  Result Date: 08/13/2018 CLINICAL DATA:  Shortness of breath EXAM: PORTABLE CHEST 1 VIEW COMPARISON:  09/23/2017 FINDINGS: Postsurgical changes at the right thoracic inlet. Multiple vascular stents in the right axillary region. Left cardiac pacing device as before. Stable cardiomegaly. No overt edema or pleural effusion. No focal airspace disease. Dilated small bowel in the upper abdomen. IMPRESSION: 1. Cardiomegaly without acute opacity 2. Dilated small bowel in the upper abdomen, suggest dedicated abdominal radiographs. Electronically Signed   By: Jasmine Pang M.D.   On: 08/13/2018 01:44   Medications:  Scheduled Meds: . aspirin  81 mg Oral QPM  . benztropine  1 mg Oral BID    . Chlorhexidine Gluconate Cloth  6 each Topical Q0600  . cholecalciferol  1,000 Units Oral Daily  . clonazePAM  0.5 mg Oral BID  . docusate sodium  100 mg Oral BID  . feeding supplement (NEPRO CARB STEADY)  237 mL Oral Daily  . Ferrous Fumarate  1 tablet Oral Daily  . folic acid  500 mcg Oral Daily  . haloperidol  5 mg Oral Once per day on Mon Wed Fri  . levothyroxine  300 mcg Oral BID  . metolazone  2.5 mg Oral Daily  . multivitamin  1 tablet Oral Daily  . NEPRO  237 mL Oral Daily  . oxcarbazepine  600 mg Oral BID  . pantoprazole (PROTONIX) IV  40 mg Intravenous Q12H  . phenytoin  100 mg Oral BH-q7a  . phenytoin  300 mg Oral QHS  . QUEtiapine  300 mg Oral TID  . senna-docusate  1 tablet Oral Daily   Continuous Infusions: PRN Meds:.acetaminophen **OR** acetaminophen, guaiFENesin-dextromethorphan, haloperidol, ondansetron **OR** ondansetron (ZOFRAN) IV, oxyCODONE-acetaminophen   Assessment: Active Problems:   SBO (small bowel obstruction) (HCC)    Plan: Patient was evaluated by Dr. Tobi Bastos yesterday, and decompression with NG tube was recommended Would repeat x-ray today or tomorrow to see if small bowel distention is improved No indication for EGD at this time, as I can make small bowel distention worse, would thus have higher risks than benefits Hemoglobin stable Continue PPI IV twice daily Avoid NSAIDs NG tube output is yellow to brown in  color, with no signs of GI bleeding  Since x-ray shows stool burden, if patient does not have any bowel movements today, would recommend 250 cc warm tap water enema  EGD can be considered as an inpatient or outpatient once acute issues or small bowel obstruction resolves.   LOS: 1 day   [name] Bouillon, MD 08/14/2018, 10:06 AM

## 2018-08-15 ENCOUNTER — Inpatient Hospital Stay: Payer: Medicare Other

## 2018-08-15 LAB — BASIC METABOLIC PANEL
ANION GAP: 20 — AB (ref 5–15)
BUN: 44 mg/dL — ABNORMAL HIGH (ref 8–23)
CALCIUM: 9 mg/dL (ref 8.9–10.3)
CO2: 25 mmol/L (ref 22–32)
CREATININE: 5.65 mg/dL — AB (ref 0.61–1.24)
Chloride: 97 mmol/L — ABNORMAL LOW (ref 98–111)
GFR calc Af Amer: 11 mL/min — ABNORMAL LOW (ref >60)
GFR, EST NON AFRICAN AMERICAN: 9 mL/min — AB (ref >60)
Glucose, Bld: 74 mg/dL (ref 70–99)
Potassium: 3.8 mmol/L (ref 3.5–5.1)
Sodium: 142 mmol/L (ref 135–145)

## 2018-08-15 LAB — CBC
HCT: 31.2 % — ABNORMAL LOW (ref 39.0–52.0)
Hemoglobin: 10.4 g/dL — ABNORMAL LOW (ref 13.0–17.0)
MCH: 32.3 pg (ref 26.0–34.0)
MCHC: 33.3 g/dL (ref 30.0–36.0)
MCV: 96.9 fL (ref 80.0–100.0)
PLATELETS: 110 10*3/uL — AB (ref 150–400)
RBC: 3.22 MIL/uL — ABNORMAL LOW (ref 4.22–5.81)
RDW: 16.4 % — AB (ref 11.5–15.5)
WBC: 7.7 10*3/uL (ref 4.0–10.5)
nRBC: 0.3 % — ABNORMAL HIGH (ref 0.0–0.2)

## 2018-08-15 MED ORDER — CALCIUM ACETATE (PHOS BINDER) 667 MG PO CAPS
2001.0000 mg | ORAL_CAPSULE | Freq: Three times a day (TID) | ORAL | Status: DC
  Administered 2018-08-15 – 2018-08-16 (×3): 2001 mg via ORAL
  Filled 2018-08-15 (×4): qty 3

## 2018-08-15 MED ORDER — BISACODYL 5 MG PO TBEC
10.0000 mg | DELAYED_RELEASE_TABLET | Freq: Every day | ORAL | Status: AC
  Administered 2018-08-15 – 2018-08-16 (×2): 10 mg via ORAL
  Filled 2018-08-15 (×2): qty 2

## 2018-08-15 NOTE — Progress Notes (Signed)
SURGICAL PROGRESS NOTE   Hospital Day(s): 2.   Post op day(s):  Marland Kitchen   Interval History: Patient seen and examined, no acute events or new complaints overnight as per nurse. Patient unable to give history due to developmental diley. Large bowel movement yesterday. New xray shows resolving bowel obstructive pattern.   Vital signs in last 24 hours: [min-max] current  Temp:  [97.5 F (36.4 C)-98.6 F (37 C)] 98.1 F (36.7 C) (10/20 0923) Pulse Rate:  [85-98] 87 (10/20 0956) Resp:  [16-21] 18 (10/20 0923) BP: (94-111)/(44-61) 95/44 (10/20 0956) SpO2:  [96 %-100 %] 98 % (10/20 0923) Weight:  [89.6 kg-89.7 kg] 89.7 kg (10/20 0500)     Height: 5\' 6"  (167.6 cm) Weight: 89.7 kg BMI (Calculated): 31.93   Physical Exam:  Constitutional: alert, cooperative and no distress  Gastrointestinal: soft, non-tender, and non-distended  Labs:  CBC Latest Ref Rng & Units 08/15/2018 08/14/2018 08/14/2018  WBC 4.0 - 10.5 K/uL 7.7 7.8 -  Hemoglobin 13.0 - 17.0 g/dL 10.4(L) 10.4(L) 11.2(L)  Hematocrit 39.0 - 52.0 % 31.2(L) 30.8(L) -  Platelets 150 - 400 K/uL 110(L) 147(L) -   CMP Latest Ref Rng & Units 08/15/2018 08/14/2018 08/13/2018  Glucose 70 - 99 mg/dL 74 82 161(W)  BUN 8 - 23 mg/dL 96(E) 45(W) 09(W)  Creatinine 0.61 - 1.24 mg/dL 1.19(J) 4.78(G) 9.56(O)  Sodium 135 - 145 mmol/L 142 137 137  Potassium 3.5 - 5.1 mmol/L 3.8 4.4 3.5  Chloride 98 - 111 mmol/L 97(L) 91(L) 89(L)  CO2 22 - 32 mmol/L 25 22 34(H)  Calcium 8.9 - 10.3 mg/dL 9.0 9.2 9.5  Total Protein 6.5 - 8.1 g/dL - - 7.6  Total Bilirubin 0.3 - 1.2 mg/dL - - 0.5  Alkaline Phos 38 - 126 U/L - - 136(H)  AST 15 - 41 U/L - - 18  ALT 0 - 44 U/L - - 12    Imaging studies:  EXAM: ABDOMEN - 1 VIEW  COMPARISON:  One-view abdomen 08/13/2018  FINDINGS: Lung bases are clear.  Heart size is normal.  NG tube is stable, coiled in the stomach.  Previously distended loops of small bowel are no longer distended. Gas pattern is  normal.  IMPRESSION: Resolved small bowel obstruction.   Electronically Signed   By: Marin Roberts M.D.   On: 08/15/2018 08:37   Assessment/Plan:  67 y.o.malewith small bowel obstruction vs ileus and constipation, complicated by pertinent medical co-morbidities including ERSD on HD TThS, CAD, HTN, HLD, GERD, Hypothyroidism, and developmental delay.  Today patient looks better most likely after bowel movement. Significant decrease in NGT (100 mL). New xray shows normal bowel pattern. NGT clamped this morning and patient doing well. Will discontinue NGT and start with clear liquid diet.  Agree to continue medical management of chronic medical condition as per Internal Medicine service.   Gae Gallop, MD

## 2018-08-15 NOTE — Progress Notes (Signed)
[facility] [place], [place] 08/15/18  Subjective:   Family at bedside.   NG tube removed. BM this morning.   Patient wants to eat.   Hemodialysis treatment yesterday. Tolerated treatment well. UF of even.   Objective:  Vital signs in last 24 hours:  Temp:  [97.5 F (36.4 C)-98.6 F (37 C)] 98.1 F (36.7 C) (10/20 0923) Pulse Rate:  [85-98] 87 (10/20 0956) Resp:  [16-21] 18 (10/20 0923) BP: (94-111)/(44-61) 95/44 (10/20 0956) SpO2:  [96 %-100 %] 98 % (10/20 0923) Weight:  [89.6 kg-89.7 kg] 89.7 kg (10/20 0500)  Weight change: -0.3 kg Filed Weights   08/14/18 0500 08/14/18 1916 08/15/18 0500  Weight: 89.9 kg 89.6 kg 89.7 kg    Intake/Output:    Intake/Output Summary (Last 24 hours) at 08/15/2018 1328 Last data filed at 08/15/2018 0900 Gross per 24 hour  Intake -  Output 400 ml  Net -400 ml     Physical Exam: General: Laying in bed  HEENT  NG tube in place  Neck  supple  Pulm/lungs  shallow breathing effort, no crackles  CVS/Heart  no rub  Abdomen:   Soft, nontender, nondistended  Extremities:  No edema  Neurologic:  alert to self,   Skin:  Normal turgor  Access:  Forearm AV graft       Basic Metabolic Panel:  Recent Labs  Lab 08/13/18 0202 08/14/18 1602 08/15/18 0432  NA 137 137 142  K 3.5 4.4 3.8  CL 89* 91* 97*  CO2 34* 22 25  GLUCOSE 129* 82 74  BUN 34* 89* 44*  CREATININE 5.78* 9.29* 5.65*  CALCIUM 9.5 9.2 9.0  PHOS  --  7.6*  --      CBC: Recent Labs  Lab 08/13/18 0202  08/13/18 1616 08/14/18 0009 08/14/18 0820 08/14/18 1602 08/15/18 0432  WBC 6.8  --   --   --   --  7.8 7.7  NEUTROABS 5.5  --   --   --   --   --   --   HGB 11.8*   < > 12.0* 11.3* 11.2* 10.4* 10.4*  HCT 35.4*  --   --   --   --  30.8* 31.2*  MCV 97.0  --   --   --   --  96.0 96.9  PLT 146*  --   --   --   --  147* 110*   < > = values in this interval not displayed.      Lab Results  Component Value Date   HEPBSAG Negative  11/27/2017      Microbiology:  Recent Results (from the past 240 hour(s))  MRSA PCR Screening     Status: None   Collection Time: 08/14/18  5:52 AM  Result Value Ref Range Status   MRSA by PCR NEGATIVE NEGATIVE Final    Comment:        The GeneXpert MRSA Assay (FDA approved for NASAL specimens only), is one component of a comprehensive MRSA colonization surveillance program. It is not intended to diagnose MRSA infection nor to guide or monitor treatment for MRSA infections. Performed at [facility], [address] [place], [place] [address]     Coagulation Studies: No results for input(s): LABPROT, INR in the last 72 hours.  Urinalysis: No results for input(s): COLORURINE, LABSPEC, PHURINE, GLUCOSEU, HGBUR, BILIRUBINUR, KETONESUR, PROTEINUR, UROBILINOGEN, NITRITE, LEUKOCYTESUR in the last 72 hours.  Invalid input(s): APPERANCEUR    Imaging: Dg Abd 1  View  Result Date: 08/15/2018 CLINICAL DATA:  Small bowel obstruction. EXAM: ABDOMEN - 1 VIEW COMPARISON:  One-view abdomen 08/13/2018 FINDINGS: Lung bases are clear.  Heart size is normal. NG tube is stable, coiled in the stomach. Previously distended loops of small bowel are no longer distended. Gas pattern is normal. IMPRESSION: Resolved small bowel obstruction. Electronically Signed   By: Marin Roberts M.D.   On: 08/15/2018 08:37   Dg Abd 1 View  Result Date: 08/13/2018 CLINICAL DATA:  Small bowel obstruction. EXAM: ABDOMEN - 1 VIEW COMPARISON:  CT abdomen pelvis from same day. FINDINGS: Nasogastric tube coiled in the stomach, with the tip in the gastric fundus. Multiple dilated loops of small bowel are unchanged. Large amount of stool in the right colon. Stool also seen within the rectum. No acute osseous abnormality. IMPRESSION: Unchanged small bowel obstruction. Electronically Signed   By: Obie Dredge M.D.   On: 08/13/2018 16:34     Medications:    . aspirin  81 mg Oral QPM  . benztropine   1 mg Oral BID  . bisacodyl  10 mg Oral Daily  . Chlorhexidine Gluconate Cloth  6 each Topical Q0600  . cholecalciferol  1,000 Units Oral Daily  . clonazePAM  0.5 mg Oral BID  . docusate sodium  100 mg Oral BID  . feeding supplement (NEPRO CARB STEADY)  237 mL Oral Daily  . Ferrous Fumarate  1 tablet Oral Daily  . folic acid  500 mcg Oral Daily  . haloperidol  5 mg Oral Once per day on Mon Wed Fri  . levothyroxine  300 mcg Oral BID  . metolazone  2.5 mg Oral Daily  . multivitamin  1 tablet Oral Daily  . NEPRO  237 mL Oral Daily  . oxcarbazepine  600 mg Oral BID  . pantoprazole (PROTONIX) IV  40 mg Intravenous Q12H  . phenytoin  100 mg Oral BH-q7a  . phenytoin  300 mg Oral QHS  . QUEtiapine  300 mg Oral TID  . senna-docusate  1 tablet Oral Daily   acetaminophen **OR** acetaminophen, guaiFENesin-dextromethorphan, haloperidol, ondansetron **OR** ondansetron (ZOFRAN) IV, oxyCODONE-acetaminophen  Assessment/ Plan:  67 y.o. African-American male with end-stage renal disease, hypertension, mild mental retardation, seizure disorder, hypothyroidism  [facility] / [address]/right arm AV graft/225 min/ TTS/95 KG  1.  End-stage renal disease: TTS. Tolerated hemodialysis treatment yesterday. Tolerated treatment well.  - TTS schedule.    2.  Anemia of chronic kidney disease: hemoglobin 10.4 Continue Epogen with hemodialysis  3.  Secondary hyperparathyroidism: with hyperphosphatemia.  - restart calcium acetate with meals.   4.  Partial Small bowel obstruction causing nausea, vomiting NG tube removed.  Appreciate GI and surgery input.   5. Hypertension: blood pressure well controlled.   LOS: 2 Lamont Dowdy 10/20/20191:28 PM  [facility] [address] [place], [place] [phone]

## 2018-08-15 NOTE — Evaluation (Signed)
Physical Therapy Evaluation Patient Details Name: [name] Wiley MRN: [id number] DOB: 1951/03/26 Today's Date: 08/15/2018   History of Present Illness  Pt is a 67 y.o. male presenting to hospital 08/13/18 with 3 days vomiting and 1 day hematemesis.  Pt admitted with SBO.  PMH includes CAD, ESRD and HD, htn, seizures, renal disorder, intellectual disability, hyperkalemia, AV fistula thrombosis, pacemaker.  Clinical Impression  Prior to hospital admission, per chart and pt, pt was ambulatory with RW.  Pt is a long term care resident at [facility].  Currently pt is modified independent semi-supine to sit; CGA with transfers (increased effort to stand noted); and CGA ambulating 40 feet with RW (pt with increasingly "bouncy" L/R movement with taking steps but overall steady with RW; pt did require cueing to stay closer to RW and also within RW with turns).  Pt would benefit from skilled PT to address noted impairments and functional limitations (see below for any additional details).  Upon hospital discharge, recommend pt discharge back to LTC facility with HHPT and supervision for mobility for safety.    Follow Up Recommendations Home health PT;Supervision for mobility/OOB    Equipment Recommendations  Rolling walker with 5" wheels    Recommendations for Other Services       Precautions / Restrictions Precautions Precautions: Fall Precaution Comments: Gastric tube; R UE fistula Restrictions Weight Bearing Restrictions: No      Mobility  Bed Mobility Overal bed mobility: Modified Independent             General bed mobility comments: Semi-supine to sit with mild increased effort  Transfers Overall transfer level: Needs assistance Equipment used: Rolling walker (2 wheeled) Transfers: Sit to/from [employer] Sit to Stand: Min guard Stand pivot transfers: Min guard       General transfer comment: increased effort and time to stand from bed noted; improved ability to  stand from recliner noted  Ambulation/Gait Ambulation/Gait assistance: Min guard Gait Distance (Feet): 40 Feet Assistive device: Rolling walker (2 wheeled)   Gait velocity: decreased   General Gait Details: decreased B step length/foot clearance/heelstrike; mild almost "bouncy" movement noted L to R taking steps that increased with distance ambulated; vc's required to stay closer to RW in general and within RW with turns  Information systems manager [employer] (Stroke Patients Only)       Balance Overall balance assessment: Needs assistance Sitting-balance support: No upper extremity supported;Feet supported Sitting balance-Leahy Scale: Good Sitting balance - Comments: steady sitting edge of bed (pt initiating swaying forward/backward on own sitting edge of bed but steady and safe with SBA)   Standing balance support: Single extremity supported Standing balance-Leahy Scale: Poor Standing balance comment: pt requires at least single UE support for static standing balance                             Pertinent Vitals/Pain Pain Assessment: No/denies pain  Vitals (HR and O2 on room air) stable and WFL throughout treatment session.    Home Living Family/patient expects to be discharged to:: Other (Comment)                 Additional Comments: Long term resident at [facility]     Prior Function Level of Independence: Needs assistance   Gait / Transfers Assistance Needed: Ambulatory with RW.  Hand Dominance        Extremity/Trunk Assessment   Upper Extremity Assessment Upper Extremity Assessment: Generalized weakness;Difficult to assess due to impaired cognition(decreased R UE shoulder flexion AROM noted)    Lower Extremity Assessment Lower Extremity Assessment: Generalized weakness;Difficult to assess due to impaired cognition    Cervical / Trunk Assessment Cervical / Trunk Assessment: Normal  Communication    Communication: Other (comment)(Pt answering questions but difficult to understand at times)  Cognition Arousal/Alertness: Awake/alert Behavior During Therapy: WFL for tasks assessed/performed Overall Cognitive Status: No family/caregiver present to determine baseline cognitive functioning(Oriented to person and place)                                        General Comments General comments (skin integrity, edema, etc.): Nurse clamped NG tube beginning of session.  Nursing cleared pt for participation in physical therapy.  Pt with covers over his head upon PT entering room but agreeable to PT session.    Exercises     Assessment/Plan    PT Assessment Patient needs continued PT services  PT Problem List Decreased strength;Decreased activity tolerance;Decreased balance;Decreased mobility;Decreased knowledge of use of DME;Decreased knowledge of precautions       PT Treatment Interventions DME instruction;Gait training;Functional mobility training;Therapeutic activities;Therapeutic exercise;Balance training;Patient/family education    PT Goals (Current goals can be found in the Care Plan section)  Acute Rehab PT Goals Patient Stated Goal: to eat (pt NPO; nurse notified of pt's request though) PT Goal Formulation: With patient Time For Goal Achievement: 08/29/18 Potential to Achieve Goals: Good    Frequency Min 2X/week   Barriers to discharge        Co-evaluation               AM-PAC PT "6 Clicks" Daily Activity  Outcome Measure Difficulty turning over in bed (including adjusting bedclothes, sheets and blankets)?: A Little Difficulty moving from lying on back to sitting on the side of the bed? : A Little Difficulty sitting down on and standing up from a chair with arms (e.g., wheelchair, bedside commode, etc,.)?: Unable Help needed moving to and from a bed to chair (including a wheelchair)?: A Little Help needed walking in hospital room?: A Little Help  needed climbing 3-5 steps with a railing? : A Lot 6 Click Score: 15    End of Session Equipment Utilized During Treatment: Gait belt Activity Tolerance: Patient tolerated treatment well Patient left: in chair;with call bell/phone within reach;with chair alarm set Nurse Communication: Mobility status;Precautions PT Visit Diagnosis: Other abnormalities of gait and mobility (R26.89);Muscle weakness (generalized) (M62.81);Difficulty in walking, not elsewhere classified (R26.2)    Time: 9604-5409 PT Time Calculation (min) (ACUTE ONLY): 24 min   Charges:   PT Evaluation $PT Eval Low Complexity: 1 Low        Demarr Kluever, PT 08/15/18, 9:21 AM [phone]

## 2018-08-15 NOTE — Progress Notes (Signed)
[facility] [place] [facility]   PATIENT NAME: [name] Wiley    MR#:  [id number]  DATE OF BIRTH:  1951/02/24  SUBJECTIVE:  CHIEF COMPLAINT:   Chief Complaint  Patient presents with  . Hematemesis   -Has NG tube and decreased output today.  Had a bowel movement last evening. -Requesting food.  REVIEW OF SYSTEMS:  Review of Systems  Unable to perform ROS: Psychiatric disorder    DRUG ALLERGIES:  No Known Allergies  VITALS:  Blood pressure (!) 95/44, pulse 87, temperature 98.1 F (36.7 C), temperature source Oral, resp. rate 18, height 5\' 6"  (1.676 m), weight 89.7 kg, SpO2 98 %.  PHYSICAL EXAMINATION:  Physical Exam   GENERAL:  67 y.o.-year-old patient lying in the bed with no acute distress.  EYES: Pupils equal, round, reactive to light and accommodation. No scleral icterus. Extraocular muscles intact.  HEENT: Head atraumatic, normocephalic. Oropharynx and nasopharynx clear.  NECK:  Supple, no jugular venous distention. No thyroid enlargement, no tenderness.  LUNGS: Normal breath sounds bilaterally, no wheezing, rales,rhonchi or crepitation. No use of accessory muscles of respiration. Decreased bibasilar breath sounds CARDIOVASCULAR: S1, S2 normal. No  rubs, or gallops. 2/6 systolic murmur present ABDOMEN: Soft, nontender, less distended. Good Bowel sounds present. No organomegaly or mass.  EXTREMITIES: No pedal edema, cyanosis, or clubbing. Right arm AV fistula present NEUROLOGIC: Cranial nerves II through XII are intact. Follows commands. Muscle strength 5/5 in all extremities. Sensation intact. Gait not checked.  PSYCHIATRIC: The patient is alert   SKIN: No obvious rash, lesion, or ulcer.    LABORATORY PANEL:   CBC Recent Labs  Lab 08/15/18 0432  WBC 7.7  HGB 10.4*  HCT 31.2*  PLT 110*   ------------------------------------------------------------------------------------------------------------------  Chemistries  Recent Labs  Lab  08/13/18 0202  08/15/18 0432  NA 137   < > 142  K 3.5   < > 3.8  CL 89*   < > 97*  CO2 34*   < > 25  GLUCOSE 129*   < > 74  BUN 34*   < > 44*  CREATININE 5.78*   < > 5.65*  CALCIUM 9.5   < > 9.0  AST 18  --   --   ALT 12  --   --   ALKPHOS 136*  --   --   BILITOT 0.5  --   --    < > = values in this interval not displayed.   ------------------------------------------------------------------------------------------------------------------  Cardiac Enzymes Recent Labs  Lab 08/13/18 0202  TROPONINI 0.04*   ------------------------------------------------------------------------------------------------------------------  RADIOLOGY:  Dg Abd 1 View  Result Date: 08/15/2018 CLINICAL DATA:  Small bowel obstruction. EXAM: ABDOMEN - 1 VIEW COMPARISON:  One-view abdomen 08/13/2018 FINDINGS: Lung bases are clear.  Heart size is normal. NG tube is stable, coiled in the stomach. Previously distended loops of small bowel are no longer distended. Gas pattern is normal. IMPRESSION: Resolved small bowel obstruction. Electronically Signed   By: Marin Roberts M.D.   On: 08/15/2018 08:37   Dg Abd 1 View  Result Date: 08/13/2018 CLINICAL DATA:  Small bowel obstruction. EXAM: ABDOMEN - 1 VIEW COMPARISON:  CT abdomen pelvis from same day. FINDINGS: Nasogastric tube coiled in the stomach, with the tip in the gastric fundus. Multiple dilated loops of small bowel are unchanged. Large amount of stool in the right colon. Stool also seen within the rectum. No acute osseous abnormality. IMPRESSION: Unchanged small bowel obstruction. Electronically Signed   By:  Obie Dredge M.D.   On: 08/13/2018 16:34    EKG:   Orders placed or performed during the hospital encounter of 08/13/18  . ED EKG  . ED EKG  . EKG 12-Lead  . EKG 12-Lead    ASSESSMENT AND PLAN:   67 year old male with past medical history significant for end-stage renal disease on Tuesday, Thursday and Saturday hemodialysis,  seizure disorder, intellectual disability, CAD and hypertension presents from nursing home secondary to coffee-ground emesis and nausea vomiting  1.  Small bowel obstruction-presented with nausea and vomiting and CT showing small bowel obstruction. -No mechanical obstruction noted. Has chronic constipation issues - KUB from yesterday with stool in colon and rectum- improved with suppository -Appreciate surgical consult.  - KUB improved now - DC Ng tube, start clear liquids and advance as tolerated - avoid constipation  2.  Coffee-ground emesis-hemoglobin is stable.  Appreciate GI consult.  No plan for any procedures at this time -Continue to trend hemoglobin.  IV Protonix for now - EGD once stable  3.  End-stage renal disease-Tuesday Thursday Saturday hemodialysis.  Appreciate nephrology consult.  For dialysis Tuesday next per schedule  4.  Schizophrenia- continue home meds  5.  Hypothyroidism-Synthroid   6.  DVT prophylaxis-we will hold heparin products, in light of GI bleed TEDs and SCDs  Patient ambulates with a walker at baseline.  Physical therapy consulted.  Patient is a long term resident at [facility]    All the records are reviewed and case discussed with Care Management/Social Workerr. Management plans discussed with the patient, family and they are in agreement.  CODE STATUS: Full Code  TOTAL TIME TAKING CARE OF THIS PATIENT: 36 minutes.   POSSIBLE D/C IN 1-2 DAYS, DEPENDING ON CLINICAL CONDITION.   Dashonna Chagnon M.D on 08/15/2018 at 11:38 AM  Between 7am to 6pm - Pager - [phone]  After 6pm go to [email or web address] - Social research officer, government  [facility]  Office  [phone]  CC: Primary care physician; Clinic, General Medical

## 2018-08-16 LAB — BASIC METABOLIC PANEL
ANION GAP: 14 (ref 5–15)
BUN: 71 mg/dL — ABNORMAL HIGH (ref 8–23)
CO2: 29 mmol/L (ref 22–32)
Calcium: 8.9 mg/dL (ref 8.9–10.3)
Chloride: 95 mmol/L — ABNORMAL LOW (ref 98–111)
Creatinine, Ser: 7.78 mg/dL — ABNORMAL HIGH (ref 0.61–1.24)
GFR, EST AFRICAN AMERICAN: 7 mL/min — AB (ref >60)
GFR, EST NON AFRICAN AMERICAN: 6 mL/min — AB (ref >60)
GLUCOSE: 100 mg/dL — AB (ref 70–99)
POTASSIUM: 4.2 mmol/L (ref 3.5–5.1)
SODIUM: 138 mmol/L (ref 135–145)

## 2018-08-16 MED ORDER — LACTULOSE 20 G PO PACK: 20.0000 g | PACK | Freq: Every day | ORAL | 0 refills | Status: AC | PRN

## 2018-08-16 MED ORDER — LACTULOSE 10 GM/15ML PO SOLN
20.0000 g | Freq: Once | ORAL | Status: AC
  Administered 2018-08-16: 20 g via ORAL
  Filled 2018-08-16: qty 30

## 2018-08-16 NOTE — Progress Notes (Signed)
08/16/2018  Subjective: No acute events overnight.  Discussed with patient's RN and no issues, tolerating full liquid diet.  Patient denies any abdominal pain, nausea, or vomiting.  Vital signs: Temp:  [98.1 F (36.7 C)-99.9 F (37.7 C)] 99.9 F (37.7 C) (10/20 2328) Pulse Rate:  [82-87] 82 (10/20 2328) Resp:  [18-25] 25 (10/20 2328) BP: (95-122)/(44-54) 99/51 (10/20 2328) SpO2:  [95 %-98 %] 95 % (10/20 2328) Weight:  [85.8 kg] 85.8 kg (10/21 0500)   Intake/Output: 10/20 0701 - 10/21 0700 In: 480 [P.O.:480] Out: 300 [Emesis/NG output:300] Last BM Date: 08/14/18  Physical Exam: Constitutional: No acute distress Abdomen:  Soft, non-distended, non-tender to palpation.  Labs:  Recent Labs    08/14/18 1602 08/15/18 0432  WBC 7.8 7.7  HGB 10.4* 10.4*  HCT 30.8* 31.2*  PLT 147* 110*   Recent Labs    08/15/18 0432 08/16/18 0710  NA 142 138  K 3.8 4.2  CL 97* 95*  CO2 25 29  GLUCOSE 74 100*  BUN 44* 71*  CREATININE 5.65* 7.78*  CALCIUM 9.0 8.9   No results for input(s): LABPROT, INR in the last 72 hours.  Imaging: No results found.  Assessment/Plan: This is a 67 y.o. male with partial SBO, now resolved.  --Will advance to renal diet this morning. --No acute surgical needs.  Will sign off at this point.  Please feel free to consult or page if any questions or concerns.   Howie Ill, MD [facility] Pager: [phone]

## 2018-08-16 NOTE — Plan of Care (Signed)

## 2018-08-16 NOTE — Progress Notes (Signed)
Patient is medically stable for D/C back to [facility] today. Per patient's sister in law Corrie Dandy she will transport patient back to [facility] today. Per Inetta Fermo [facility] liaison patient can return today to room 304-A. RN will call report. Clinical Child psychotherapist (CSW) sent D/C orders to [facility] via [facility]. Patient is aware of above. Per Corrie Dandy she will left her husband Gardiner Barefoot (patinet's brother) know about D/C today. [facility] dialysis coordinator is aware of above. Please reconsult if future social work needs arise. CSW signing off.   [employer], LCSW [phone]

## 2018-08-16 NOTE — Plan of Care (Signed)
  Problem: Education: Goal: Knowledge of General Education information will improve Description Including pain rating scale, medication(s)/side effects and non-pharmacologic comfort measures 08/16/2018 0451 by Roxana Hires, RN Outcome: Progressing 08/16/2018 0449 by Sherl Yzaguirre, Genelle Gather, RN Outcome: Progressing   Problem: Health Behavior/Discharge Planning: Goal: Ability to manage health-related needs will improve 08/16/2018 0451 by Brentt Fread, Genelle Gather, RN Outcome: Progressing 08/16/2018 0449 by Roxana Hires, RN Outcome: Progressing   Problem: Clinical Measurements: Goal: Ability to maintain clinical measurements within normal limits will improve 08/16/2018 0451 by Antavius Sperbeck, Genelle Gather, RN Outcome: Progressing 08/16/2018 0449 by Roxana Hires, RN Outcome: Progressing Goal: Will remain free from infection 08/16/2018 0451 by Roxana Hires, RN Outcome: Progressing 08/16/2018 0449 by Roxana Hires, RN Outcome: Progressing Goal: Diagnostic test results will improve 08/16/2018 0451 by Reynaldo Rossman, Genelle Gather, RN Outcome: Progressing 08/16/2018 0449 by Roxana Hires, RN Outcome: Progressing Goal: Respiratory complications will improve 08/16/2018 0451 by Roxana Hires, RN Outcome: Progressing 08/16/2018 0449 by Roxana Hires, RN Outcome: Progressing Goal: Cardiovascular complication will be avoided 08/16/2018 0451 by Roxana Hires, RN Outcome: Progressing 08/16/2018 0449 by Roxana Hires, RN Outcome: Progressing   Problem: Activity: Goal: Risk for activity intolerance will decrease 08/16/2018 0451 by Roxana Hires, RN Outcome: Progressing 08/16/2018 0449 by Roxana Hires, RN Outcome: Progressing   Problem: Nutrition: Goal: Adequate nutrition will be maintained 08/16/2018 0451 by Roxana Hires, RN Outcome: Progressing 08/16/2018 0449 by Lavayah Vita, Genelle Gather, RN Outcome: Progressing

## 2018-08-16 NOTE — Progress Notes (Signed)
Pt ready for discharge to SNF per MD. Pt assessment unchanged from this morning. Report called to [facility] at [facility]; all questions answered and discharge instructions reviewed. Pt's sister in law wants to transport pt back to [facility]. PIV removed, BP prior to d/c was 97/58, pt states he "feels fine." Dr. Nemiah Commander notified of BP and stated it was ok to still d/c pt Pt belongings packed, and assisted to car by NT.

## 2018-08-16 NOTE — Discharge Summary (Signed)
[facility]   PATIENT NAME: [name] Wiley    MR#:  [id number]  DATE OF BIRTH:  [birth year]  DATE OF ADMISSION:  08/13/2018   ADMITTING PHYSICIAN: Arnaldo Natal, MD  DATE OF DISCHARGE:  08/16/18  PRIMARY CARE PHYSICIAN: Clinic, General Medical   ADMISSION DIAGNOSIS:   Partial small bowel obstruction (HCC) [K56.600] Hematemesis with nausea [K92.0]  DISCHARGE DIAGNOSIS:   Active Problems:   SBO (small bowel obstruction) (HCC)   SECONDARY DIAGNOSIS:   Past Medical History:  Diagnosis Date  . Anxiety   . CAD (coronary artery disease)   . Dialysis patient (HCC)   . GERD (gastroesophageal reflux disease)   . Hypertension   . Hypothyroid   . Intellectual disability   . Renal disorder   . Seizures [facility])     HOSPITAL COURSE:   67 year old male with past medical history significant for end-stage renal disease on Tuesday, Thursday and Saturday hemodialysis, seizure disorder, intellectual disability, CAD and hypertension presents from nursing home secondary to coffee-ground emesis and nausea vomiting  1.  Small bowel obstruction-presented with nausea and vomiting and CT showing small bowel obstruction. -No mechanical obstruction noted. Has chronic constipation issues - KUB with stool in colon and rectum- improved with suppository -Appreciate surgical consult.  -Follow-up KUB showing resolved bowel obstruction.  Patient started on liquid diet and advance to a solid diet and tolerating well. -Constipation needs to be avoided  2.  Coffee-ground emesis-hemoglobin is stable.  Appreciate GI consult.  No plan for any procedures at this time -Continue to trend hemoglobin.  Received IV Protonix, changed to oral PPI at discharge  3.  End-stage renal disease-Tuesday Thursday Saturday hemodialysis.  Appreciate nephrology consult.  For dialysis tomorrow per schedule  4.  Schizophrenia- continue home meds  5.   Hypothyroidism-Synthroid   Patient ambulates with a walker at baseline.  Physical therapy consulted.  Patient is a long term resident at [facility]-will be discharged today  DISCHARGE CONDITIONS:   Guarded  CONSULTS OBTAINED:   Treatment Team:  Mosetta Pigeon, MD Midge Minium, MD [name], MD  DRUG ALLERGIES:   No Known Allergies DISCHARGE MEDICATIONS:   Allergies as of 08/16/2018   No Known Allergies     Medication List    TAKE these medications   aspirin 81 MG chewable tablet Chew 81 mg by mouth every evening.   benztropine 1 MG tablet Commonly known as:  COGENTIN Take 1 mg by mouth 2 (two) times daily.   calcium acetate 667 MG capsule Commonly known as:  PHOSLO Take 1,334 mg by mouth 3 (three) times daily with meals.   cholecalciferol 1000 units tablet Commonly known as:  VITAMIN D Take 1,000 Units by mouth daily.   cinacalcet 90 MG tablet Commonly known as:  SENSIPAR Take 90 mg by mouth 3 (three) times a week. Take on dialysis days   clonazePAM 0.5 MG tablet Commonly known as:  KLONOPIN Take 1 tablet (0.5 mg total) by mouth 2 (two) times daily. *hold for sedation*   docusate sodium 100 MG capsule Commonly known as:  COLACE Take 1 capsule (100 mg total) by mouth 2 (two) times daily.   Ferrous Fumarate 324 (106 Fe) MG Tabs tablet Commonly known as:  HEMOCYTE - 106 mg FE Take 1 tablet by mouth daily.   Fish Oil 1000 MG Caps Take 1,000 mg by mouth 2 (two) times daily.   folic acid 400 MCG tablet Commonly known as:  FOLVITE Take  400 mcg by mouth daily.   guaiFENesin-dextromethorphan 100-10 MG/5ML syrup Commonly known as:  ROBITUSSIN DM Take 10 mLs by mouth every 4 (four) hours as needed for cough.   haloperidol 5 MG tablet Commonly known as:  HALDOL Take 5 mg by mouth daily as needed for agitation. What changed:  Another medication with the same name was changed. Make sure you understand how and when to take each.   haloperidol 5 MG  tablet Commonly known as:  HALDOL Take 1 tablet (5 mg total) by mouth 3 (three) times a week. Pt takes on Tuesday, Thursday, and Saturday. What changed:  additional instructions   lactulose 20 g packet Commonly known as:  CEPHULAC Take 1 packet (20 g total) by mouth daily as needed (constipation).   levothyroxine 300 MCG tablet Commonly known as:  SYNTHROID, LEVOTHROID Take 300 mcg by mouth 2 (two) times daily.   metolazone 2.5 MG tablet Commonly known as:  ZAROXOLYN Take 2.5 mg by mouth daily.   multivitamin Tabs tablet Take 1 tablet by mouth daily.   NEPRO Liqd Take 240 mLs by mouth daily.   feeding supplement (NEPRO CARB STEADY) Liqd Take 237 mLs by mouth daily.   omeprazole 20 MG tablet Commonly known as:  PRILOSEC OTC Take 20 mg by mouth daily.   oxcarbazepine 600 MG tablet Commonly known as:  TRILEPTAL Take 600 mg by mouth 2 (two) times daily.   oxyCODONE-acetaminophen 5-325 MG tablet Commonly known as:  PERCOCET/ROXICET Take 1 tablet by mouth every 6 (six) hours as needed for moderate pain or severe pain.   phenytoin 100 MG ER capsule Commonly known as:  DILANTIN Take 100-300 mg by mouth See admin instructions. Take 1 capsule by mouth at 6 am, and take 3 capsules (300 mg) by mouth every night at bedtime at 8 pm.   QUEtiapine 300 MG 24 hr tablet Commonly known as:  SEROQUEL XR Take 300 mg by mouth 3 (three) times daily.   senna-docusate 8.6-50 MG tablet Commonly known as:  Senokot-S Take 1 tablet by mouth daily.   urea 40 % Crea Commonly known as:  CARMOL Apply 1 application topically daily.        DISCHARGE INSTRUCTIONS:   1.  PCP follow-up in 1 to 2 weeks 2.  For dialysis in 1 to 2 days 3.  Please give PRN laxatives if no bowel movement in a couple of days  DIET:   Renal diet  ACTIVITY:   Activity as tolerated  OXYGEN:   Home Oxygen: No.  Oxygen Delivery: room air  DISCHARGE LOCATION:   Long term care   If you experience  worsening of your admission symptoms, develop shortness of breath, life threatening emergency, suicidal or homicidal thoughts you must seek medical attention immediately by calling 911 or calling your MD immediately  if symptoms less severe.  You Must read complete instructions/literature along with all the possible adverse reactions/side effects for all the Medicines you take and that have been prescribed to you. Take any new Medicines after you have completely understood and accpet all the possible adverse reactions/side effects.   Please note  You were cared for by a hospitalist during your hospital stay. If you have any questions about your discharge medications or the care you received while you were in the hospital after you are discharged, you can call the unit and asked to speak with the hospitalist on call if the hospitalist that took care of you is not available. Once you are  discharged, your primary care physician will handle any further medical issues. Please note that NO REFILLS for any discharge medications will be authorized once you are discharged, as it is imperative that you return to your primary care physician (or establish a relationship with a primary care physician if you do not have one) for your aftercare needs so that they can reassess your need for medications and monitor your lab values.    On the day of Discharge:  VITAL SIGNS:   Blood pressure (!) 111/51, pulse 81, temperature 97.8 F (36.6 C), temperature source Oral, resp. rate 18, height 5\' 6"  (1.676 m), weight 85.8 kg, SpO2 100 %.  PHYSICAL EXAMINATION:    GENERAL:  67 y.o.-year-old patient lying in the bed with no acute distress.  EYES: Pupils equal, round, reactive to light and accommodation. No scleral icterus. Extraocular muscles intact.  HEENT: Head atraumatic, normocephalic. Oropharynx and nasopharynx clear.  NECK:  Supple, no jugular venous distention. No thyroid enlargement, no tenderness.  LUNGS:  Normal breath sounds bilaterally, no wheezing, rales,rhonchi or crepitation. No use of accessory muscles of respiration. Decreased bibasilar breath sounds CARDIOVASCULAR: S1, S2 normal. No  rubs, or gallops. 2/6 systolic murmur present ABDOMEN: Soft, nontender, less distended. Good Bowel sounds present. No organomegaly or mass.  EXTREMITIES: No pedal edema, cyanosis, or clubbing. Right arm AV fistula present NEUROLOGIC: Cranial nerves II through XII are intact.  Speech is not understandable.  Follows commands. Muscle strength 5/5 in all extremities. Sensation intact. Gait not checked.  PSYCHIATRIC: The patient is alert   and oriented.  Low intellectual capacity.  Speech is not understandable SKIN: No obvious rash, lesion, or ulcer.    DATA REVIEW:   CBC Recent Labs  Lab 08/15/18 0432  WBC 7.7  HGB 10.4*  HCT 31.2*  PLT 110*    Chemistries  Recent Labs  Lab 08/13/18 0202  08/16/18 0710  NA 137   < > 138  K 3.5   < > 4.2  CL 89*   < > 95*  CO2 34*   < > 29  GLUCOSE 129*   < > 100*  BUN 34*   < > 71*  CREATININE 5.78*   < > 7.78*  CALCIUM 9.5   < > 8.9  AST 18  --   --   ALT 12  --   --   ALKPHOS 136*  --   --   BILITOT 0.5  --   --    < > = values in this interval not displayed.     Microbiology Results  Results for orders placed or performed during the hospital encounter of 08/13/18  MRSA PCR Screening     Status: None   Collection Time: 08/14/18  5:52 AM  Result Value Ref Range Status   MRSA by PCR NEGATIVE NEGATIVE Final    Comment:        The GeneXpert MRSA Assay (FDA approved for NASAL specimens only), is one component of a comprehensive MRSA colonization surveillance program. It is not intended to diagnose MRSA infection nor to guide or monitor treatment for MRSA infections. Performed at [facility], [address] [place], [place] [address]     RADIOLOGY:  No results found.   Management plans discussed with the patient, family and they  are in agreement.  CODE STATUS:     Code Status Orders  (From admission, onward)         Start     Ordered  08/13/18 1010  Full code  Continuous     08/13/18 1010        Code Status History    Date Active Date Inactive Code Status Order ID Comments User Context   11/27/2017 1629 12/02/2017 2303 Full Code [id number]  Altamese Dilling, MD Inpatient   09/24/2017 0034 09/25/2017 1254 Full Code [id number]  Houston Siren, MD Inpatient   04/26/2016 1447 05/01/2016 2133 Full Code [id number]  Katharina Caper, MD Inpatient   03/03/2015 1843 03/06/2015 1933 Full Code [id number]  Enid Baas, MD Inpatient      TOTAL TIME TAKING CARE OF THIS PATIENT: 38 minutes.    Charika Mikelson M.D on 08/16/2018 at 1:12 PM  Between 7am to 6pm - Pager - [phone]  After 6pm go to [email or web address] - Social research officer, government  [facility] [place] [place] [facility]  Office  [phone]  CC: Primary care physician; Clinic, General Medical   Note: This dictation was prepared with Dragon dictation along with smaller phrase technology. Any transcriptional errors that result from this process are unintentional.

## 2018-08-16 NOTE — Care Management Important Message (Signed)
Important Message  Patient Details  Name: [name] Wiley MRN: [id number] Date of Birth: [birth year]   Medicare Important Message Given:  Yes    [name] 08/16/2018, 11:09 AM

## 2018-08-19 ENCOUNTER — Emergency Department
Admission: EM | Admit: 2018-08-19 | Discharge: 2018-08-27 | Disposition: E | Payer: Medicare Other | Attending: Student in an Organized Health Care Education/Training Program | Admitting: Student in an Organized Health Care Education/Training Program

## 2018-08-19 ENCOUNTER — Other Ambulatory Visit: Payer: Self-pay

## 2018-08-19 DIAGNOSIS — Z79899 Other long term (current) drug therapy: Secondary | ICD-10-CM | POA: Diagnosis not present

## 2018-08-19 DIAGNOSIS — N186 End stage renal disease: Secondary | ICD-10-CM | POA: Insufficient documentation

## 2018-08-19 DIAGNOSIS — I120 Hypertensive chronic kidney disease with stage 5 chronic kidney disease or end stage renal disease: Secondary | ICD-10-CM | POA: Diagnosis not present

## 2018-08-19 DIAGNOSIS — I469 Cardiac arrest, cause unspecified: Secondary | ICD-10-CM | POA: Diagnosis present

## 2018-08-19 DIAGNOSIS — I2510 Atherosclerotic heart disease of native coronary artery without angina pectoris: Secondary | ICD-10-CM | POA: Insufficient documentation

## 2018-08-19 DIAGNOSIS — Z7982 Long term (current) use of aspirin: Secondary | ICD-10-CM | POA: Diagnosis not present

## 2018-08-19 DIAGNOSIS — R569 Unspecified convulsions: Secondary | ICD-10-CM | POA: Insufficient documentation

## 2018-08-19 DIAGNOSIS — F79 Unspecified intellectual disabilities: Secondary | ICD-10-CM | POA: Diagnosis not present

## 2018-08-19 DIAGNOSIS — Z992 Dependence on renal dialysis: Secondary | ICD-10-CM | POA: Insufficient documentation

## 2018-08-19 LAB — GLUCOSE, CAPILLARY: Glucose-Capillary: 105 mg/dL — ABNORMAL HIGH (ref 70–99)

## 2018-08-19 MED ORDER — CALCIUM GLUCONATE 10 % IV SOLN
INTRAVENOUS | Status: AC
  Filled 2018-08-19: qty 10

## 2018-08-20 MED FILL — Medication: Qty: 1 | Status: AC

## 2018-08-27 NOTE — ED Notes (Signed)
Bill performing CPR at this time

## 2018-08-27 NOTE — ED Notes (Signed)
Steph performing CPR at this time

## 2018-08-27 NOTE — ED Notes (Signed)
Samuel Bouche still in place

## 2018-08-27 NOTE — ED Notes (Signed)
Pt tubed at this time

## 2018-08-27 NOTE — ED Notes (Signed)
Pulse check at this time.

## 2018-08-27 NOTE — ED Notes (Signed)
Pt being bagged at this time by respiratory. MD at head of bed attempting intubation.

## 2018-08-27 NOTE — ED Notes (Signed)
Pulse check, CPR resumed.  

## 2018-08-27 NOTE — ED Notes (Signed)
1000mg  Calcium gluconate given by Morrie Sheldon RN

## 2018-08-27 NOTE — ED Notes (Signed)
OG placed by MD Roxan Hockey

## 2018-08-27 NOTE — ED Notes (Addendum)
Magnet to pacemaker at this time. Unable to deactivate at this time.

## 2018-08-27 NOTE — ED Notes (Signed)
1 bicarb given by Paulino Rily

## 2018-08-27 NOTE — ED Notes (Addendum)
Called [employer] and spoke with Felipa Eth. All informaton obtained and donor number received and documented in chart. Funeral name listed. House Supervisor Annice Pih obtained filled out forms by family.

## 2018-08-27 NOTE — ED Provider Notes (Signed)
[facility] Emergency Department Provider Note    First MD Initiated Contact with Patient 2018-08-31 1600     (approximate)  I have reviewed the triage vital signs and the nursing notes.   HISTORY  Chief Complaint Cardiac Arrest  Level V Caveat:  Cardiac arrest  HPI [name] Wiley is a 67 y.o. male arise from dialysis.  Patient arrives in cardiac arrest with CPR in progress with BVM.  Patient reportedly completed less than half of his dialysis session started feeling unwell went out to the waiting room and had 2 witnessed "seizure-like episodes.  Was brought back into dialysis center.  EMS was called.  Patient then went unresponsive at 3:03 pm and CPR was initiated.  EMS found the patient to be in PEA arrest.  Glucose was 180.  He was given IV epinephrine, IV calcium and transported to the ER.   Past Medical History:  Diagnosis Date  . Anxiety   . CAD (coronary artery disease)   . Dialysis patient (HCC)   . GERD (gastroesophageal reflux disease)   . Hypertension   . Hypothyroid   . Intellectual disability   . Renal disorder   . Seizures (HCC)    Family History  Problem Relation Age of Onset  . Congestive Heart Failure Father    Past Surgical History:  Procedure Laterality Date  . A/V SHUNT INTERVENTION N/A 12/02/2017   Procedure: A/V SHUNT INTERVENTION;  Surgeon: Renford Dills, MD;  Location: [facility] INVASIVE CV LAB;  Service: Cardiovascular;  Laterality: N/A;  . AV FISTULA PLACEMENT Right   . FISTULOGRAM Right 03/06/2015   Procedure: Fistulogram;  Surgeon: Renford Dills, MD;  Location: [facility] INVASIVE CV LAB;  Service: Cardiovascular;  Laterality: Right;  . INSERT / REPLACE / REMOVE PACEMAKER    . PERIPHERAL VASCULAR CATHETERIZATION N/A 05/01/2016   Procedure: A/V Shunt Intervention/declot, possible permcath placement;  Surgeon: Annice Needy, MD;  Location: [facility] INVASIVE CV LAB;  Service: Cardiovascular;  Laterality: N/A;  . PERIPHERAL VASCULAR  THROMBECTOMY Right 11/27/2017   Procedure: PERIPHERAL VASCULAR THROMBECTOMY;  Surgeon: Renford Dills, MD;  Location: [facility] INVASIVE CV LAB;  Service: Cardiovascular;  Laterality: Right;   Patient Active Problem List   Diagnosis Date Noted  . SBO (small bowel obstruction) (HCC) 08/13/2018  . 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 Hyperkalemia 04/26/2016  . Hyponatremia 04/26/2016  . Pancytopenia (HCC) 04/26/2016  . End stage renal disease (HCC) 04/26/2016  . 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 Authorizing Provider  aspirin 81 MG chewable tablet Chew 81 mg by mouth every evening.    [provider]  benztropine (COGENTIN) 1 MG tablet Take 1 mg by mouth 2 (two) times daily.    [provider]  calcium acetate (PHOSLO) 667 MG capsule Take 1,334 mg by mouth 3 (three) times daily with meals.    [provider]  cholecalciferol (VITAMIN D) 1000 units tablet Take 1,000 Units by mouth daily.    [provider]  cinacalcet (SENSIPAR) 90 MG tablet Take 90 mg by mouth 3 (three) times a week. Take on dialysis days    [provider]  clonazePAM (KLONOPIN) 0.5 MG tablet Take 1 tablet (0.5 mg total) by mouth 2 (two) times daily. *hold for sedation* 05/01/16   Enid Baas, MD  docusate sodium (COLACE) 100 MG capsule Take 1 capsule (100 mg total) by mouth 2 (  two) times daily. 05/01/16   Enid Baas, MD  Ferrous Fumarate 324 (106 FE) MG TABS Take 1 tablet by mouth daily.    [provider]  folic acid (FOLVITE) 400 MCG tablet Take 400 mcg by mouth daily.     [provider]  guaiFENesin-dextromethorphan (ROBITUSSIN DM) 100-10 MG/5ML syrup Take 10 mLs by mouth every 4 (four) hours as needed for cough.    [provider]  haloperidol (HALDOL) 5 MG tablet Take 1 tablet (5 mg total) by mouth 3 (three) times a week. Pt takes on Tuesday,  Thursday, and Saturday. Patient taking differently: Take 5 mg by mouth 3 (three) times a week. Pt takes on Tuesday, Thursday, and Saturday (non-dialysis days) 05/01/16   Enid Baas, MD  haloperidol (HALDOL) 5 MG tablet Take 5 mg by mouth daily as needed for agitation.    [provider]  lactulose (CEPHULAC) 20 g packet Take 1 packet (20 g total) by mouth daily as needed (constipation). 08/16/18   Enid Baas, MD  levothyroxine (SYNTHROID, LEVOTHROID) 300 MCG tablet Take 300 mcg by mouth 2 (two) times daily.     [provider]  metolazone (ZAROXOLYN) 2.5 MG tablet Take 2.5 mg by mouth daily.    [provider]  multivitamin (RENA-VIT) TABS tablet Take 1 tablet by mouth daily.    [provider]  Nutritional Supplements (FEEDING SUPPLEMENT, NEPRO CARB STEADY,) LIQD Take 237 mLs by mouth daily. 05/01/16   Enid Baas, MD  Nutritional Supplements (NEPRO) LIQD Take 240 mLs by mouth daily.    [provider]  Omega-3 Fatty Acids (FISH OIL) 1000 MG CAPS Take 1,000 mg by mouth 2 (two) times daily.    [provider]  omeprazole (PRILOSEC OTC) 20 MG tablet Take 20 mg by mouth daily.    [provider]  oxcarbazepine (TRILEPTAL) 600 MG tablet Take 600 mg by mouth 2 (two) times daily.    [provider]  oxyCODONE-acetaminophen (PERCOCET/ROXICET) 5-325 MG tablet Take 1 tablet by mouth every 6 (six) hours as needed for moderate pain or severe pain. 05/01/16   Enid Baas, MD  phenytoin (DILANTIN) 100 MG ER capsule Take 100-300 mg by mouth See admin instructions. Take 1 capsule by mouth at 6 am, and take 3 capsules (300 mg) by mouth every night at bedtime at 8 pm.    [provider]  QUEtiapine (SEROQUEL XR) 300 MG 24 hr tablet Take 300 mg by mouth 3 (three) times daily.     [provider]  senna-docusate (SENOKOT-S) 8.6-50 MG tablet Take 1 tablet by mouth daily. 05/01/16   Enid Baas, MD    urea (CARMOL) 40 % CREA Apply 1 application topically daily.    [provider]    Allergies Patient has no known allergies.    Social History Social History   Tobacco Use  . Smoking status: Never Smoker  . Smokeless tobacco: Never Used  Substance Use Topics  . Alcohol use: No  . Drug use: No    Review of Systems Patient denies headaches, rhinorrhea, blurry vision, numbness, shortness of breath, chest pain, edema, cough, abdominal pain, nausea, vomiting, diarrhea, dysuria, fevers, rashes or hallucinations unless otherwise stated above in HPI. ____________________________________________   PHYSICAL EXAM:  VITAL SIGNS: Vitals:   August 27, 2018 1555 2018-08-27 1556  BP:    Pulse:  (!) 56  Resp: 13 15  SpO2:  (!) 66%    Constitutional: critically ill, CPR in progress Eyes: Conjunctivae are normal.  Head: Atraumatic. Nose: No congestion/rhinnorhea. Mouth/Throat: emesis in oropharynx Neck: No stridor. Painless ROM.  Cardiovascular:pulseless Good peripheral circulation. Respiratory: agonal respirations Gastrointestinal:distended and Genitourinary: deferred Musculoskeletal: trace bilateral pitting edema Neurologic:  GCS 3 t Skin:  Skin is cool and mottled. No rash noted.____________________________________________   LABS (all labs ordered are listed, but only abnormal results are displayed)  Results for orders placed or performed during the hospital encounter of 2018/08/30 (from the past 24 hour(s))  Glucose, capillary     Status: Abnormal   Collection Time: 2018/08/30  3:42 PM  Result Value Ref Range   Glucose-Capillary 105 (H) 70 - 99 mg/dL   ____________________________________________ ____________________________________________  ___________________________   EMERGENCY DEPARTMENT [place] CARDIAC EXAM "Study: Limited Ultrasound of the Heart and Pericardium"  INDICATIONS:Abnormal vital signs Multiple views of the heart and pericardium were obtained in real-time  with a multi-frequency probe.  PERFORMED UE:AVWUJW IMAGES ARCHIVED?: No LIMITATIONS:  Emergent procedure VIEWS USED: Subcostal 4 chamber and Apical 4 chamber  INTERPRETATION: Cardiac activity absent and Pericardial effusioin absent    PROCEDURES  Procedure(s) performed:  Procedure Name: Intubation Date/Time: 08-30-2018 4:27 PM Performed by: Willy Eddy, MD Pre-anesthesia Checklist: Patient identified, Emergency Drugs available, Suction available and Patient being monitored Oxygen Delivery Method: Ambu bag Preoxygenation: Pre-oxygenation with 100% oxygen Induction Type: IV induction and Rapid sequence Ventilation: Two handed mask ventilation required Laryngoscope Size: Glidescope and 4 Grade View: Grade III Tube size: 7.5 mm Number of attempts: 1 Airway Equipment and Method: Video-laryngoscopy and Stylet Placement Confirmation: ETT inserted through vocal cords under direct vision,  CO2 detector,  Breath sounds checked- equal and bilateral and Positive ETCO2 Secured at: 23 cm Tube secured with: ETT holder Dental Injury: Teeth and Oropharynx as per pre-operative assessment     .Critical Care Performed by: Willy Eddy, MD Authorized by: Willy Eddy, MD   Critical care provider statement:    Critical care time (minutes):  15   Critical care time was exclusive of:  Separately billable procedures and treating other patients   Critical care was necessary to treat or prevent imminent or life-threatening deterioration of the following conditions:  Cardiac failure and respiratory failure   Critical care was time spent personally by me on the following activities:  Development of treatment plan with patient or surrogate, discussions with consultants, evaluation of patient's response to treatment, examination of patient, obtaining history from patient or surrogate, ordering and performing treatments and interventions, ordering and review of laboratory studies, ordering  and review of radiographic studies, pulse oximetry, re-evaluation of patient's condition and review of old charts      Critical Care performed: yes ____________________________________________   INITIAL IMPRESSION / ASSESSMENT AND PLAN / ED COURSE  Pertinent labs & imaging results that were available during my care of the patient were reviewed by me and considered in my medical decision making (see chart for details).   DDX: cardiac arrest, electrolyte abn, respiratory distress, aspiration  Domanik Rainville is a 67 y.o. who presents to the ED with witnessed arrest arrives to the ER with ongoing CPR.  Patient intubated for airway protection.  Did have large volume of emesis in his airway fair amount of aspiration as well.  Patient with PEA.  No pulses.  Is having paced rhythm given known pacer.  Exam as above.  CPR continued following ACLS protocols.  See MAR for further details on dosing of epinephrine, IV calcium, IV sodium bicarb.  Glucose checked and normal.  Clinical Course as of Aug 20 1631  Thu Sep 03, 2018  1600 After extensive efforts at cardiopulmonary resuscitation patient was never able to obtain return of spontaneous circulation remained in PEA arrest.  TOD called at 15:55.   [PR]    Clinical Course User Index [PR] Willy Eddy, MD   Discussed case with medical examiner who has released patient.  As part of my medical decision making, I reviewed the following data within the electronic MEDICAL RECORD NUMBER Nursing notes reviewed and incorporated, Labs reviewed, notes from prior ED visits .   ____________________________________________   FINAL CLINICAL IMPRESSION(S) / ED DIAGNOSES  Final diagnoses:  Cardiac arrest (HCC)      NEW MEDICATIONS STARTED DURING THIS VISIT:  New Prescriptions   No medications on file     Note:  This document was prepared using Dragon voice recognition software and may include unintentional dictation errors.    Willy Eddy,  MD 03-Sep-2018 [phone]

## 2018-08-27 NOTE — Progress Notes (Signed)
   August 22, 2018 1700  Clinical Encounter Type  Visited With Patient and family together  Visit Type Patient actively dying;Initial;Spiritual support  Referral From Nurse  Recommendations Follow-up with family, if requested.  Spiritual Encounters  Spiritual Needs Grief support;Emotional  Stress Factors  Family Stress Factors  (Patient's Death)   Chaplain responded to 1537 page. Patient was receiving CPR and Chaplain prayed for the patient and care team. Chaplain went to the family room and provided pastoral presence, prayer, and emotional support for the patient's brother, Gardiner Barefoot, and his wife Corrie Dandy. After the patient's death, Chaplain escorted the family to the patient's room for visitation. Finally, Chaplain spoke to the attending physician and administrative coordinator about the family's request for information about an autopsy. Throughout, Chaplain offered energetic prayer and presence.

## 2018-08-27 NOTE — ED Notes (Addendum)
[place] check by MD Roxan Hockey, pulse check and any cardiac activity

## 2018-08-27 NOTE — ED Notes (Signed)
150 ketamine given

## 2018-08-27 NOTE — ED Notes (Addendum)
Pt PEA at this time. Time of death called by MD Roxan Hockey

## 2018-08-27 NOTE — ED Notes (Signed)
1 epi given by Paulino Rily

## 2018-08-27 NOTE — ED Notes (Signed)
1 calcium chloride given by Paulino Rily

## 2018-08-27 NOTE — ED Notes (Signed)
1 epi given 

## 2018-08-27 NOTE — ED Notes (Signed)
Pt has IO established by EMS

## 2018-08-27 NOTE — ED Notes (Signed)
100 roc given at this time

## 2018-08-27 NOTE — ED Notes (Signed)
BS 105 

## 2018-08-27 NOTE — ED Notes (Signed)
Pulse check with [place]

## 2018-08-27 NOTE — ED Notes (Addendum)
Per EMS pt at dialysis got sick and placed out in lobby. Pt had multiple seizures,. EMS called and responded. Pt coded at 1503. Samuel Bouche in place. Pt given 1 epi, bicarb and calcium chloride. Pt arrives with LUCAS in place.   MD Roxan Hockey, Respiratory, EDT Vett, RN Bill, RN Morrie Sheldon, RN Steph

## 2018-08-27 DEATH — deceased

## 2019-10-15 IMAGING — CT CT ABD-PELV W/O CM
2 of 4 series · 15 of 46 positions shown, 17 images · non-contrast
Comparison: CT 10/17/2013

CLINICAL DATA: Coffee ground emesis. Nausea and vomiting for 3
days. Abdominal infection.

EXAM:
CT ABDOMEN AND PELVIS WITHOUT CONTRAST
TECHNIQUE: Multidetector CT imaging of the abdomen and pelvis was performed
following the standard protocol without IV contrast. IV contrast
attempted, however IV infiltrate of 18 mL [id number], no IV contrast
administered.

[Series 2: axial st · axial · 0.83mm/px · z∈[-760,-370]mm · 12 of 90 slices shown, 14 images]
[im 8/90  soft-tissue]
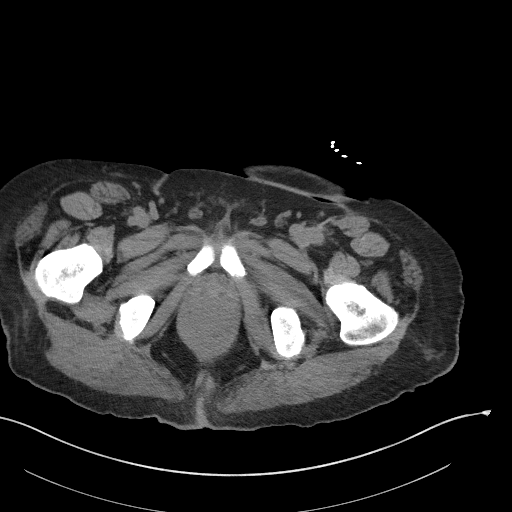
[im 8/90  bone]
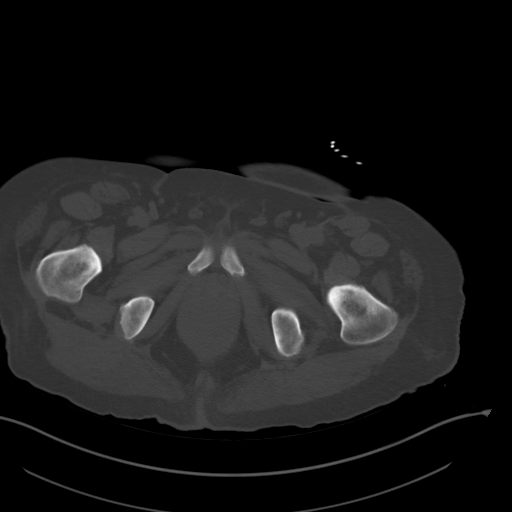
[im 15/90  soft-tissue]
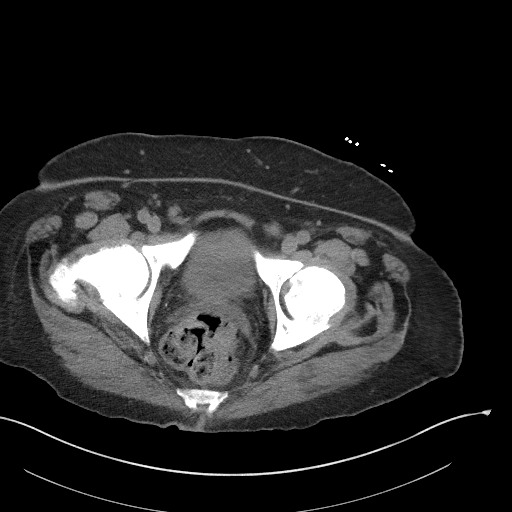
[im 22/90  soft-tissue]
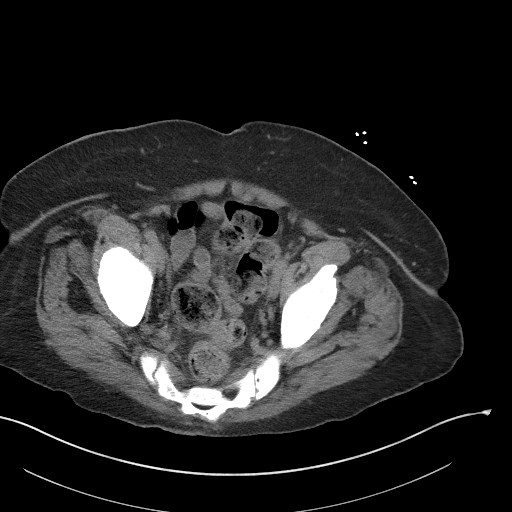
[im 29/90  soft-tissue]
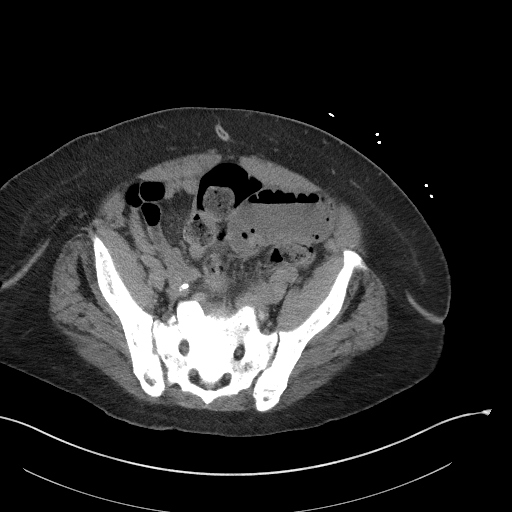
[im 36/90  soft-tissue]
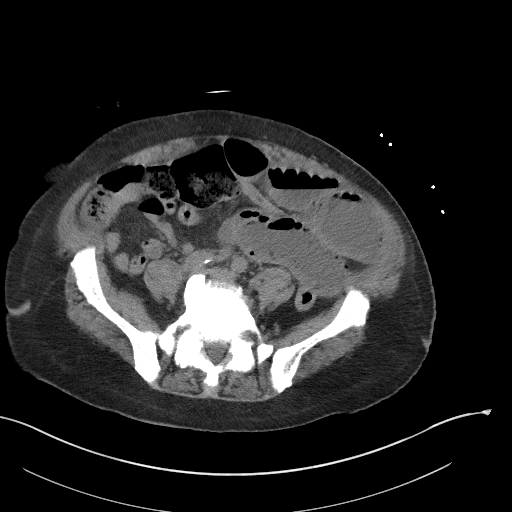
[im 43/90  soft-tissue]
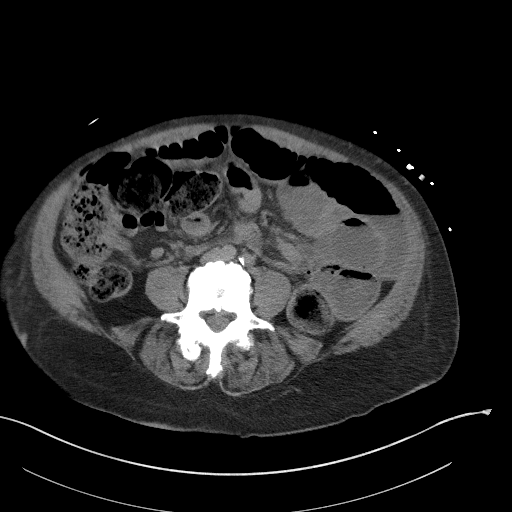
[im 50/90  soft-tissue]
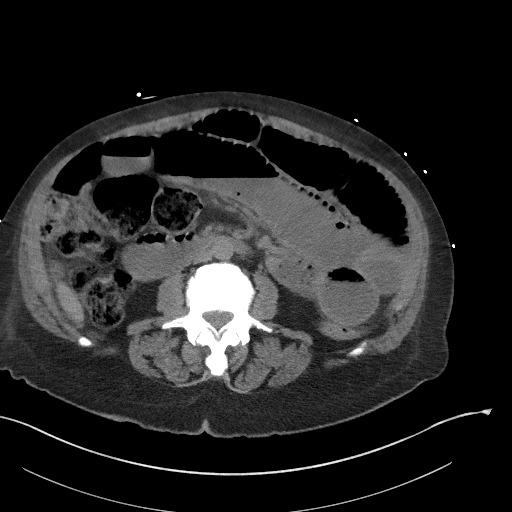
[im 57/90  soft-tissue]
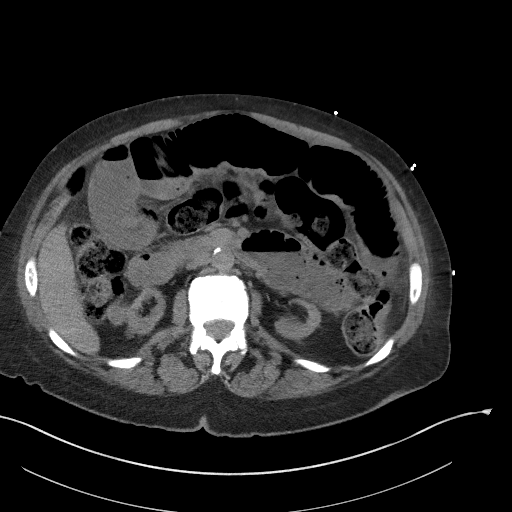
[im 65/90  soft-tissue]
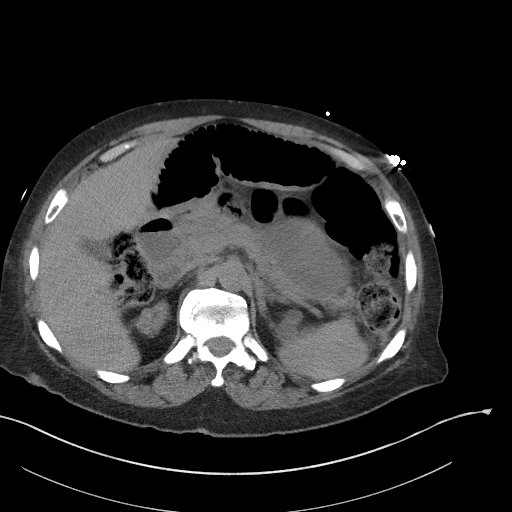
[im 65/90  bone]
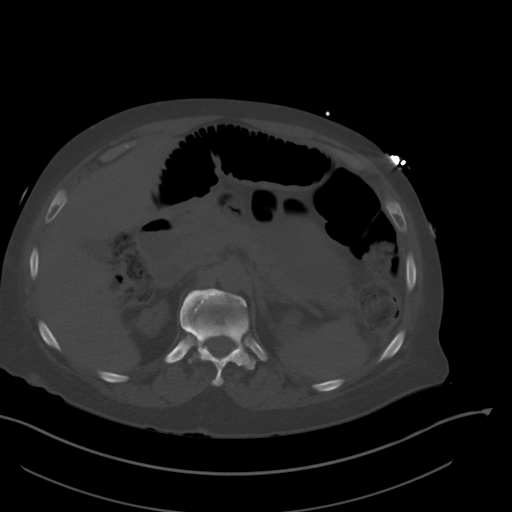
[im 72/90  soft-tissue]
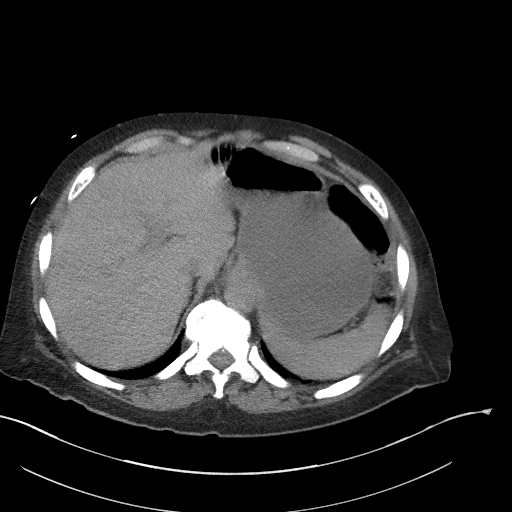
[im 79/90  soft-tissue]
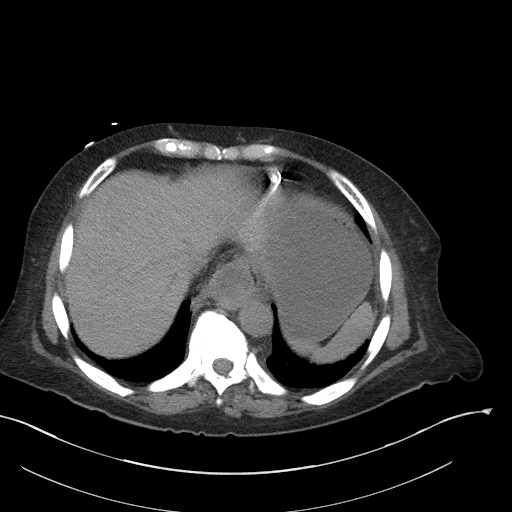
[im 86/90  soft-tissue]
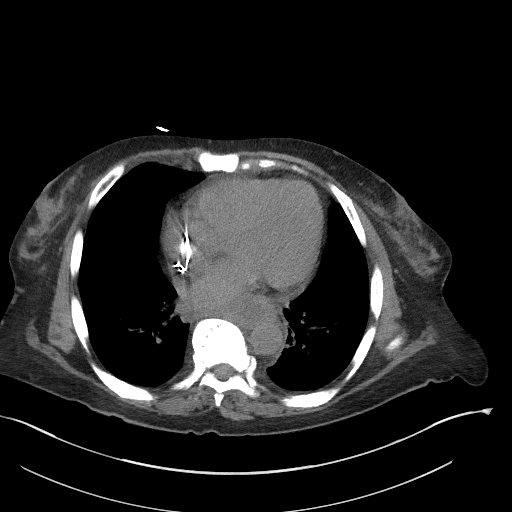

[Series 5: coronal st · coronal · 0.83mm/px · 3 of 90 slices shown]
[im 30/90  soft-tissue]
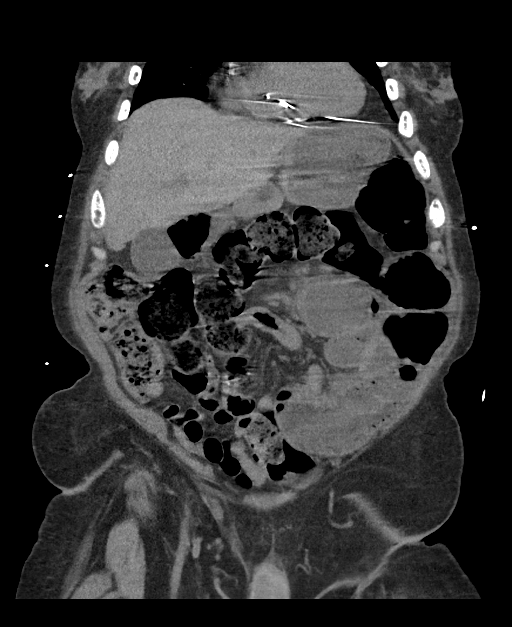
[im 40/90  soft-tissue]
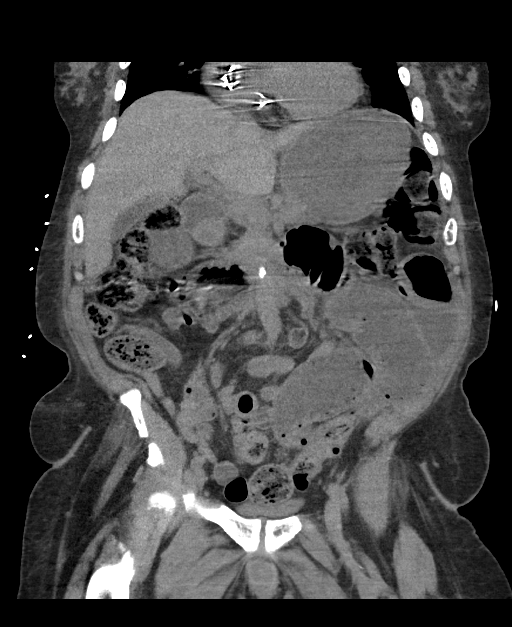
[im 50/90  soft-tissue]
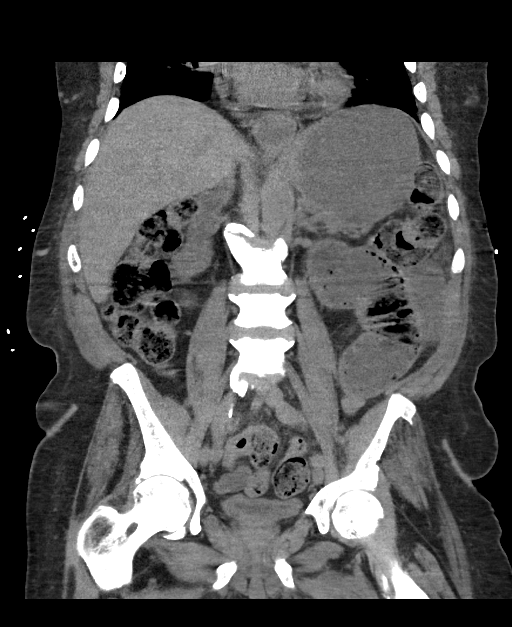

[15 of 46 positions shown; findings below may reference images not displayed]

FINDINGS: Lower chest: Ground-glass nodular opacities in the right lower lobe.
Esophagus is distended and fluid-filled. There is ill-defined
esophageal wall thickening. Pacemaker wires are partially included.

Hepatobiliary: No focal hepatic lesion on noncontrast exam.
Gallbladder partially distended. No calcified gallstone. No biliary
dilatation.

Pancreas: Partially obscured due to adjacent bowel and motion, no
ductal dilatation or obvious inflammatory change.

Spleen: Normal in size without focal abnormality.

Adrenals/Urinary Tract: No adrenal nodule. Bilateral renal
parenchymal atrophy. Bilateral renal cysts including small
hyperdense cyst in the left kidney, similar to prior exam. No
hydronephrosis. Urinary bladder near completely decompressed.

Stomach/Bowel: Distended distal esophagus which is fluid-filled.
Mild distal esophageal wall thickening. Fluid-filled distended
stomach. Proximal small bowel is dilated and fluid-filled. There is
general transition from dilated to nondilated small bowel in the
lower abdomen without abrupt transition point. Mild small bowel wall
thickening in the region of transition, images 56-58 series 2. More
distal small bowel is decompressed. Appendix not confidently
visualized. Moderate stool burden throughout the colon. No colonic
inflammation.

Vascular/Lymphatic: Aortic and branch atherosclerosis. No aneurysm.
Lack of contrast and motion artifact limits assessment for
adenopathy, no bulky adenopathy is seen.

Reproductive: Prostate is unremarkable.

Other: No free air or ascites.

Musculoskeletal: Sequela of renal osteodystrophy with diffuse
osseous sclerosis and endplate changes in the lumbar spine. No acute
osseous abnormalities.
IMPRESSION: 1. Dilated fluid-filled distal esophagus, stomach, and proximal and
mid small bowel. Gradual small bowel transition in the pelvis were
there is short/moderate length small bowel wall thickening. Findings
suggest partial small bowel obstruction in the region of small bowel
thickening. Infectious, inflammatory, as well as neoplastic
etiologies are considered for cause of wall thickening and
obstruction.
2. Ill-defined esophageal wall thickening, favor reflux or
esophagitis.
3. Nodular and ground-glass opacities in the right lower lobe,
suspicious for aspiration in the setting.
4.  Aortic Atherosclerosis ([id number]).

## 2024-10-17 ENCOUNTER — Other Ambulatory Visit (HOSPITAL_BASED_OUTPATIENT_CLINIC_OR_DEPARTMENT_OTHER): Payer: Self-pay
